# Patient Record
Sex: Female | Born: 1935
Health system: Southern US, Community
[De-identification: ages and names within clinical notes are randomized; demographics above are authoritative.]

## PROBLEM LIST (undated history)

## (undated) ENCOUNTER — Emergency Department (HOSPITAL_COMMUNITY): Admission: EM | Payer: Medicare Other | Source: Home / Self Care

## (undated) DIAGNOSIS — I10 Essential (primary) hypertension: Secondary | ICD-10-CM

## (undated) DIAGNOSIS — N814 Uterovaginal prolapse, unspecified: Secondary | ICD-10-CM

## (undated) DIAGNOSIS — B450 Pulmonary cryptococcosis: Secondary | ICD-10-CM

## (undated) DIAGNOSIS — E785 Hyperlipidemia, unspecified: Secondary | ICD-10-CM

## (undated) DIAGNOSIS — K219 Gastro-esophageal reflux disease without esophagitis: Secondary | ICD-10-CM

## (undated) DIAGNOSIS — R55 Syncope and collapse: Secondary | ICD-10-CM

## (undated) DIAGNOSIS — K5792 Diverticulitis of intestine, part unspecified, without perforation or abscess without bleeding: Secondary | ICD-10-CM

## (undated) DIAGNOSIS — R51 Headache: Secondary | ICD-10-CM

## (undated) DIAGNOSIS — F0390 Unspecified dementia without behavioral disturbance: Secondary | ICD-10-CM

## (undated) DIAGNOSIS — K632 Fistula of intestine: Secondary | ICD-10-CM

## (undated) DIAGNOSIS — C6932 Malignant neoplasm of left choroid: Secondary | ICD-10-CM

## (undated) DIAGNOSIS — M1990 Unspecified osteoarthritis, unspecified site: Secondary | ICD-10-CM

## (undated) DIAGNOSIS — M069 Rheumatoid arthritis, unspecified: Secondary | ICD-10-CM

## (undated) DIAGNOSIS — J45909 Unspecified asthma, uncomplicated: Secondary | ICD-10-CM

## (undated) HISTORY — PX: TONSILLECTOMY: SUR1361

## (undated) HISTORY — DX: Uterovaginal prolapse, unspecified: N81.4

## (undated) HISTORY — PX: NEPHRECTOMY: SHX65

## (undated) HISTORY — DX: Pulmonary cryptococcosis: B45.0

## (undated) HISTORY — DX: Gastro-esophageal reflux disease without esophagitis: K21.9

## (undated) HISTORY — PX: BRAIN MENINGIOMA EXCISION: SHX576

## (undated) HISTORY — PX: BLADDER SUSPENSION: SHX72

## (undated) HISTORY — DX: Malignant neoplasm of left choroid: C69.32

## (undated) HISTORY — PX: LUNG REMOVAL, PARTIAL: SHX233

## (undated) HISTORY — PX: EYE SURGERY: SHX253

## (undated) HISTORY — PX: CHOLECYSTECTOMY: SHX55

## (undated) HISTORY — DX: Rheumatoid arthritis, unspecified: M06.9

## (undated) HISTORY — DX: Hyperlipidemia, unspecified: E78.5

## (undated) HISTORY — PX: LOOP RECORDER IMPLANT: SHX5954

## (undated) HISTORY — DX: Syncope and collapse: R55

---

## 1997-10-06 ENCOUNTER — Ambulatory Visit (HOSPITAL_COMMUNITY): Admission: RE | Admit: 1997-10-06 | Discharge: 1997-10-06 | Payer: Self-pay | Admitting: Family Medicine

## 1998-03-18 ENCOUNTER — Other Ambulatory Visit: Admission: RE | Admit: 1998-03-18 | Discharge: 1998-03-18 | Payer: Self-pay | Admitting: *Deleted

## 1998-05-11 ENCOUNTER — Other Ambulatory Visit: Admission: RE | Admit: 1998-05-11 | Discharge: 1998-05-11 | Payer: Self-pay | Admitting: *Deleted

## 1998-10-20 ENCOUNTER — Other Ambulatory Visit: Admission: RE | Admit: 1998-10-20 | Discharge: 1998-10-20 | Payer: Self-pay | Admitting: Family Medicine

## 1998-10-26 ENCOUNTER — Encounter: Payer: Self-pay | Admitting: Family Medicine

## 1998-10-26 ENCOUNTER — Ambulatory Visit (HOSPITAL_COMMUNITY): Admission: RE | Admit: 1998-10-26 | Discharge: 1998-10-26 | Payer: Self-pay | Admitting: Family Medicine

## 1999-02-20 ENCOUNTER — Emergency Department (HOSPITAL_COMMUNITY): Admission: EM | Admit: 1999-02-20 | Discharge: 1999-02-20 | Payer: Self-pay | Admitting: Emergency Medicine

## 1999-02-20 ENCOUNTER — Encounter: Payer: Self-pay | Admitting: Emergency Medicine

## 1999-02-21 ENCOUNTER — Encounter: Payer: Self-pay | Admitting: Surgery

## 1999-02-21 ENCOUNTER — Encounter: Admission: RE | Admit: 1999-02-21 | Discharge: 1999-02-21 | Payer: Self-pay | Admitting: Surgery

## 1999-03-03 ENCOUNTER — Encounter: Payer: Self-pay | Admitting: Urology

## 1999-03-08 ENCOUNTER — Inpatient Hospital Stay (HOSPITAL_COMMUNITY): Admission: RE | Admit: 1999-03-08 | Discharge: 1999-03-13 | Payer: Self-pay | Admitting: Urology

## 1999-03-08 ENCOUNTER — Encounter (INDEPENDENT_AMBULATORY_CARE_PROVIDER_SITE_OTHER): Payer: Self-pay

## 1999-03-08 ENCOUNTER — Encounter: Payer: Self-pay | Admitting: Urology

## 1999-06-07 ENCOUNTER — Other Ambulatory Visit: Admission: RE | Admit: 1999-06-07 | Discharge: 1999-06-07 | Payer: Self-pay | Admitting: *Deleted

## 1999-10-23 ENCOUNTER — Encounter: Payer: Self-pay | Admitting: Urology

## 1999-10-23 ENCOUNTER — Encounter: Admission: RE | Admit: 1999-10-23 | Discharge: 1999-10-23 | Payer: Self-pay | Admitting: Urology

## 1999-10-25 ENCOUNTER — Other Ambulatory Visit: Admission: RE | Admit: 1999-10-25 | Discharge: 1999-10-25 | Payer: Self-pay | Admitting: Family Medicine

## 1999-10-31 ENCOUNTER — Encounter: Admission: RE | Admit: 1999-10-31 | Discharge: 1999-10-31 | Payer: Self-pay | Admitting: Family Medicine

## 1999-10-31 ENCOUNTER — Encounter: Payer: Self-pay | Admitting: Family Medicine

## 1999-10-31 ENCOUNTER — Ambulatory Visit (HOSPITAL_COMMUNITY): Admission: RE | Admit: 1999-10-31 | Discharge: 1999-10-31 | Payer: Self-pay | Admitting: Family Medicine

## 2000-01-11 ENCOUNTER — Ambulatory Visit (HOSPITAL_COMMUNITY): Admission: RE | Admit: 2000-01-11 | Discharge: 2000-01-11 | Payer: Self-pay | Admitting: Gastroenterology

## 2000-05-10 ENCOUNTER — Ambulatory Visit (HOSPITAL_COMMUNITY): Admission: RE | Admit: 2000-05-10 | Discharge: 2000-05-10 | Payer: Self-pay | Admitting: Urology

## 2000-05-10 ENCOUNTER — Encounter: Payer: Self-pay | Admitting: Urology

## 2000-08-05 ENCOUNTER — Other Ambulatory Visit: Admission: RE | Admit: 2000-08-05 | Discharge: 2000-08-05 | Payer: Self-pay | Admitting: *Deleted

## 2000-11-04 ENCOUNTER — Ambulatory Visit (HOSPITAL_COMMUNITY): Admission: RE | Admit: 2000-11-04 | Discharge: 2000-11-04 | Payer: Self-pay | Admitting: Family Medicine

## 2000-11-04 ENCOUNTER — Encounter: Payer: Self-pay | Admitting: Family Medicine

## 2001-09-15 ENCOUNTER — Other Ambulatory Visit: Admission: RE | Admit: 2001-09-15 | Discharge: 2001-09-15 | Payer: Self-pay | Admitting: *Deleted

## 2001-09-30 ENCOUNTER — Encounter: Payer: Self-pay | Admitting: Surgery

## 2001-09-30 ENCOUNTER — Encounter: Admission: RE | Admit: 2001-09-30 | Discharge: 2001-09-30 | Payer: Self-pay | Admitting: Surgery

## 2001-11-06 ENCOUNTER — Encounter: Payer: Self-pay | Admitting: Family Medicine

## 2001-11-06 ENCOUNTER — Ambulatory Visit (HOSPITAL_COMMUNITY): Admission: RE | Admit: 2001-11-06 | Discharge: 2001-11-06 | Payer: Self-pay | Admitting: Family Medicine

## 2002-11-09 ENCOUNTER — Encounter: Payer: Self-pay | Admitting: Family Medicine

## 2002-11-09 ENCOUNTER — Ambulatory Visit (HOSPITAL_COMMUNITY): Admission: RE | Admit: 2002-11-09 | Discharge: 2002-11-09 | Payer: Self-pay | Admitting: Family Medicine

## 2002-11-20 ENCOUNTER — Encounter: Payer: Self-pay | Admitting: Urology

## 2002-11-20 ENCOUNTER — Encounter: Admission: RE | Admit: 2002-11-20 | Discharge: 2002-11-20 | Payer: Self-pay | Admitting: Urology

## 2003-01-04 ENCOUNTER — Inpatient Hospital Stay (HOSPITAL_COMMUNITY): Admission: RE | Admit: 2003-01-04 | Discharge: 2003-01-09 | Payer: Self-pay | Admitting: Thoracic Surgery

## 2003-01-04 ENCOUNTER — Encounter (INDEPENDENT_AMBULATORY_CARE_PROVIDER_SITE_OTHER): Payer: Self-pay | Admitting: Specialist

## 2003-01-04 ENCOUNTER — Encounter: Payer: Self-pay | Admitting: Thoracic Surgery

## 2003-01-05 ENCOUNTER — Encounter: Payer: Self-pay | Admitting: Thoracic Surgery

## 2003-01-06 ENCOUNTER — Encounter: Payer: Self-pay | Admitting: Thoracic Surgery

## 2003-01-07 ENCOUNTER — Encounter: Payer: Self-pay | Admitting: Thoracic Surgery

## 2003-01-08 ENCOUNTER — Encounter: Payer: Self-pay | Admitting: Thoracic Surgery

## 2003-01-14 ENCOUNTER — Encounter: Admission: RE | Admit: 2003-01-14 | Discharge: 2003-01-14 | Payer: Self-pay | Admitting: Thoracic Surgery

## 2003-02-04 ENCOUNTER — Encounter: Admission: RE | Admit: 2003-02-04 | Discharge: 2003-02-04 | Payer: Self-pay | Admitting: Thoracic Surgery

## 2003-03-03 ENCOUNTER — Encounter: Admission: RE | Admit: 2003-03-03 | Discharge: 2003-03-03 | Payer: Self-pay | Admitting: Thoracic Surgery

## 2003-11-03 ENCOUNTER — Emergency Department (HOSPITAL_COMMUNITY): Admission: EM | Admit: 2003-11-03 | Discharge: 2003-11-04 | Payer: Self-pay | Admitting: Emergency Medicine

## 2003-12-09 ENCOUNTER — Ambulatory Visit (HOSPITAL_COMMUNITY): Admission: RE | Admit: 2003-12-09 | Discharge: 2003-12-09 | Payer: Self-pay | Admitting: Family Medicine

## 2003-12-09 ENCOUNTER — Other Ambulatory Visit: Admission: RE | Admit: 2003-12-09 | Discharge: 2003-12-09 | Payer: Self-pay | Admitting: *Deleted

## 2004-04-25 ENCOUNTER — Encounter: Admission: RE | Admit: 2004-04-25 | Discharge: 2004-04-25 | Payer: Self-pay | Admitting: Family Medicine

## 2004-07-21 ENCOUNTER — Inpatient Hospital Stay (HOSPITAL_COMMUNITY): Admission: EM | Admit: 2004-07-21 | Discharge: 2004-07-24 | Payer: Self-pay | Admitting: Podiatry

## 2005-01-04 ENCOUNTER — Ambulatory Visit (HOSPITAL_COMMUNITY): Admission: RE | Admit: 2005-01-04 | Discharge: 2005-01-04 | Payer: Self-pay | Admitting: Family Medicine

## 2005-04-03 ENCOUNTER — Other Ambulatory Visit: Admission: RE | Admit: 2005-04-03 | Discharge: 2005-04-03 | Payer: Self-pay | Admitting: Obstetrics & Gynecology

## 2005-04-17 ENCOUNTER — Encounter: Admission: RE | Admit: 2005-04-17 | Discharge: 2005-04-17 | Payer: Self-pay | Admitting: Obstetrics & Gynecology

## 2005-10-03 ENCOUNTER — Encounter: Admission: RE | Admit: 2005-10-03 | Discharge: 2005-10-03 | Payer: Self-pay | Admitting: Family Medicine

## 2006-01-07 ENCOUNTER — Ambulatory Visit (HOSPITAL_COMMUNITY): Admission: RE | Admit: 2006-01-07 | Discharge: 2006-01-07 | Payer: Self-pay | Admitting: Obstetrics & Gynecology

## 2006-02-21 ENCOUNTER — Ambulatory Visit: Admission: RE | Admit: 2006-02-21 | Discharge: 2006-05-12 | Payer: Self-pay | Admitting: *Deleted

## 2006-02-26 LAB — CREATININE, SERUM: Creatinine, Ser: 0.87 mg/dL (ref 0.40–1.20)

## 2006-07-01 ENCOUNTER — Encounter: Admission: RE | Admit: 2006-07-01 | Discharge: 2006-07-01 | Payer: Self-pay | Admitting: Family Medicine

## 2006-08-13 ENCOUNTER — Ambulatory Visit (HOSPITAL_COMMUNITY): Admission: RE | Admit: 2006-08-13 | Discharge: 2006-08-13 | Payer: Self-pay | Admitting: *Deleted

## 2006-08-15 ENCOUNTER — Ambulatory Visit: Admission: RE | Admit: 2006-08-15 | Discharge: 2006-10-21 | Payer: Self-pay | Admitting: Radiation Oncology

## 2006-08-15 LAB — CBC WITH DIFFERENTIAL/PLATELET
BASO%: 0.3 % (ref 0.0–2.0)
Basophils Absolute: 0 10*3/uL (ref 0.0–0.1)
EOS%: 0.6 % (ref 0.0–7.0)
HCT: 34.8 % (ref 34.8–46.6)
HGB: 12.2 g/dL (ref 11.6–15.9)
MCH: 32.5 pg (ref 26.0–34.0)
MCHC: 35 g/dL (ref 32.0–36.0)
MCV: 93 fL (ref 81.0–101.0)
MONO%: 8.8 % (ref 0.0–13.0)
NEUT%: 69.3 % (ref 39.6–76.8)
RDW: 13.7 % (ref 11.3–14.5)

## 2006-08-15 LAB — BASIC METABOLIC PANEL
CO2: 25 mEq/L (ref 19–32)
Calcium: 9.4 mg/dL (ref 8.4–10.5)
Chloride: 102 mEq/L (ref 96–112)
Creatinine, Ser: 0.69 mg/dL (ref 0.40–1.20)
Glucose, Bld: 120 mg/dL — ABNORMAL HIGH (ref 70–99)

## 2006-10-22 ENCOUNTER — Ambulatory Visit (HOSPITAL_COMMUNITY): Admission: RE | Admit: 2006-10-22 | Discharge: 2006-10-22 | Payer: Self-pay | Admitting: *Deleted

## 2007-01-09 ENCOUNTER — Ambulatory Visit (HOSPITAL_COMMUNITY): Admission: RE | Admit: 2007-01-09 | Discharge: 2007-01-09 | Payer: Self-pay | Admitting: Obstetrics & Gynecology

## 2007-11-14 ENCOUNTER — Encounter: Admission: RE | Admit: 2007-11-14 | Discharge: 2007-11-14 | Payer: Self-pay | Admitting: Obstetrics & Gynecology

## 2007-12-31 ENCOUNTER — Other Ambulatory Visit: Admission: RE | Admit: 2007-12-31 | Discharge: 2007-12-31 | Payer: Self-pay | Admitting: Obstetrics & Gynecology

## 2008-01-13 ENCOUNTER — Encounter: Admission: RE | Admit: 2008-01-13 | Discharge: 2008-01-13 | Payer: Self-pay | Admitting: Obstetrics & Gynecology

## 2008-04-16 ENCOUNTER — Inpatient Hospital Stay (HOSPITAL_COMMUNITY): Admission: EM | Admit: 2008-04-16 | Discharge: 2008-04-19 | Payer: Self-pay | Admitting: Emergency Medicine

## 2009-01-13 ENCOUNTER — Encounter: Admission: RE | Admit: 2009-01-13 | Discharge: 2009-01-13 | Payer: Self-pay | Admitting: Family Medicine

## 2009-12-22 ENCOUNTER — Encounter: Admission: RE | Admit: 2009-12-22 | Discharge: 2009-12-22 | Payer: Self-pay | Admitting: Family Medicine

## 2009-12-29 ENCOUNTER — Ambulatory Visit: Payer: Self-pay | Admitting: Cardiology

## 2009-12-29 ENCOUNTER — Inpatient Hospital Stay (HOSPITAL_COMMUNITY): Admission: EM | Admit: 2009-12-29 | Discharge: 2009-12-31 | Payer: Self-pay | Admitting: Emergency Medicine

## 2009-12-31 ENCOUNTER — Encounter (INDEPENDENT_AMBULATORY_CARE_PROVIDER_SITE_OTHER): Payer: Self-pay | Admitting: Internal Medicine

## 2010-01-09 ENCOUNTER — Ambulatory Visit (HOSPITAL_COMMUNITY): Admission: RE | Admit: 2010-01-09 | Discharge: 2010-01-09 | Payer: Self-pay | Admitting: Cardiology

## 2010-04-09 ENCOUNTER — Encounter: Payer: Self-pay | Admitting: Family Medicine

## 2010-04-20 ENCOUNTER — Encounter: Payer: Self-pay | Admitting: Internal Medicine

## 2010-05-03 ENCOUNTER — Ambulatory Visit (INDEPENDENT_AMBULATORY_CARE_PROVIDER_SITE_OTHER): Payer: Medicare Other | Admitting: Internal Medicine

## 2010-05-03 ENCOUNTER — Encounter: Payer: Self-pay | Admitting: Internal Medicine

## 2010-05-03 DIAGNOSIS — R55 Syncope and collapse: Secondary | ICD-10-CM

## 2010-05-03 DIAGNOSIS — I10 Essential (primary) hypertension: Secondary | ICD-10-CM

## 2010-05-10 NOTE — Assessment & Plan Note (Signed)
Summary: nep/syncope/poss pacemaker/[facility]/dr skains [id number]...   Visit Type:  Initial Consult Primary Provider:  Dr Nathanial Rancher   History of Present Illness: Ashley [name] is referred today by Dr. Anne Fu for evaluation of syncope in the setting of prior ILR insertion. The patient is a very pleasant elderly woman with a h/o unexplained syncope. She underwent ILR insertion several months ago. Since then she has not had any syncope. She denies c/p, sob or peripheral edema.  Preventive Screening-Counseling & Management  Alcohol-Tobacco     Smoking Status: never  Caffeine-Diet-Exercise     Does Patient Exercise: no      Drug Use:  no.    Current Medications (verified): 1)  Calcium Carbonate-Vitamin D 600-400 Mg-Unit  Tabs (Calcium Carbonate-Vitamin D) .... Two Times A Day 2)  Warfarin Sodium 5 Mg Tabs (Warfarin Sodium) .... Use As Directed By Anticoagulation Clinic 3)  Hydrochlorothiazide 12.5 Mg Tabs (Hydrochlorothiazide) .... Take One Tablet By Mouth Daily. 4)  Vitamin D (Ergocalciferol) 50000 Unit Caps (Ergocalciferol) .... Weekly 5)  Sulfasalazine 500 Mg Tabs (Sulfasalazine) .... 2 Tabs Two Times A Day 6)  Tricor 145 Mg Tabs (Fenofibrate) .... Once Daily 7)  Methylprednisolone 4 Mg Tabs (Methylprednisolone) .... Once Daily 8)  Lovaza 1 Gm Caps (Omega-3-Acid Ethyl Esters) .Marland Kitchen.. 1 Caps Once Daily 9)  Trazodone Hcl 300 Mg Tabs (Trazodone Hcl) .... At Bedtime 10)  Restasis 0.05 % Emul (Cyclosporine) .... Two Times A Day 11)  Multivitamins   Tabs (Multiple Vitamin) .... Once Daily 12)  Latanoprost 0.005 % Soln (Latanoprost) .... Once Daily 13)  Bromday 0.09 % (Daily) Soln (Bromfenac Sodium) .... One Drop in Each Eye Two Times A Day  Allergies (verified): No Known Drug Allergies  Past History:  Family History: Last updated: 05/03/2010 maternal aunt -- heart problems  Social History: Last updated: 05/03/2010 Retired  Married  Tobacco Use - No.  Alcohol Use - no Regular  Exercise - no Drug Use - no  Past Medical History: syncope HTN s/p nephrectomy s/p eye surgery  Past Surgical History: Nephrectomy-Native bladder tact lung -- partial removal tumor from left eye  Family History: maternal aunt -- heart problems  Social History: Retired  Married  Tobacco Use - No.  Alcohol Use - no Regular Exercise - no Drug Use - no Smoking Status:  never Does Patient Exercise:  no Drug Use:  no  Review of Systems  The patient denies chest pain, syncope, dyspnea on exertion, and peripheral edema.         Constipation, fatigue, constipations and arthritis are all present but otherwise all systems negative except as noted in the HPI.  Vital Signs:  Patient profile:   77 year old female Height:      63 inches Weight:      134 pounds BMI:     23.82 Pulse rate:   65 / minute BP sitting:   112 / 66  (left arm) Cuff size:   regular  Vitals Entered By: Hardin Negus, RMA (May 03, 2010 3:32 PM)  Physical Exam  General:  Elderly, well developed, well nourished, in no acute distress.  HEENT: normal Neck: supple. No JVD. Carotids 2+ bilaterally no bruits Cor: RRR no rubs, gallops or murmur Lungs: CTA with well healed ILR incision. Ab: soft, nontender. nondistended. No HSM. Good bowel sounds Ext: warm. no cyanosis, clubbing or edema Neuro: alert and oriented. Grossly nonfocal. affect pleasant     ILR Following MD skains DOI:  01/09/2010 Vendor:  [employer]  Model Number:  [id number]     Serial Number [id number]       Tachy Episodes:  115     Brady Episodes:  2  MD Comments:  Her ILR is working normally.  Impression & Recommendations:  Problem # 1:  SYNCOPE (ICD-780.2) The etiology of her symptoms is still unclear. Her ILR has demonstrated no brady or tachy arrhythmias. Will follow. Her updated medication list for this problem includes:    Warfarin Sodium 5 Mg Tabs (Warfarin sodium) ..... Use as directed by anticoagulation clinic     Methylprednisolone 4 Mg Tabs (Methylprednisolone) ..... Once daily

## 2010-05-25 NOTE — Letter (Signed)
[facility]  [facility] 2011-2012   Imported By: Marylou Mccoy 05/17/2010 14:02:23  _____________________________________________________________________  External Attachment:    Type:   Image     Comment:   External Document

## 2010-05-31 LAB — PROTIME-INR
INR: 2.4 — ABNORMAL HIGH (ref 0.00–1.49)
INR: 2.62 — ABNORMAL HIGH (ref 0.00–1.49)
Prothrombin Time: 28.1 seconds — ABNORMAL HIGH (ref 11.6–15.2)

## 2010-05-31 LAB — COMPREHENSIVE METABOLIC PANEL
ALT: 12 U/L (ref 0–35)
Albumin: 2.7 g/dL — ABNORMAL LOW (ref 3.5–5.2)
BUN: 6 mg/dL (ref 6–23)
CO2: 27 mEq/L (ref 19–32)
Calcium: 9.2 mg/dL (ref 8.4–10.5)
Creatinine, Ser: 0.69 mg/dL (ref 0.4–1.2)
GFR calc non Af Amer: >60 mL/min (ref >60)
Total Protein: 5.5 g/dL — ABNORMAL LOW (ref 6.0–8.3)

## 2010-05-31 LAB — CBC: WBC: 7.6 10*3/uL (ref 4.0–10.5)

## 2010-05-31 LAB — DIFFERENTIAL
Basophils Relative: 0 % (ref 0–1)
Eosinophils Absolute: 0.1 10*3/uL (ref 0.0–0.7)
Lymphs Abs: 2.2 10*3/uL (ref 0.7–4.0)
Monocytes Absolute: 0.8 10*3/uL (ref 0.1–1.0)
Monocytes Relative: 10 % (ref 3–12)

## 2010-05-31 LAB — SURGICAL PCR SCREEN: Staphylococcus aureus: NEGATIVE

## 2010-05-31 LAB — METHYLMALONIC ACID(MMA), RND URINE

## 2010-06-01 LAB — CK TOTAL AND CKMB (NOT AT ARMC)
CK, MB: 0.5 ng/mL (ref 0.3–4.0)
CK, MB: 0.6 ng/mL (ref 0.3–4.0)
CK, MB: 0.7 ng/mL (ref 0.3–4.0)
Relative Index: INVALID (ref 0.0–2.5)
Total CK: 47 U/L (ref 7–177)
Total CK: 50 U/L (ref 7–177)
Total CK: 56 U/L (ref 7–177)

## 2010-06-01 LAB — URINALYSIS, ROUTINE W REFLEX MICROSCOPIC
Ketones, ur: NEGATIVE mg/dL
Nitrite: NEGATIVE
pH: 7 (ref 5.0–8.0)

## 2010-06-01 LAB — CARDIAC PANEL(CRET KIN+CKTOT+MB+TROPI)
CK, MB: 0.9 ng/mL (ref 0.3–4.0)
Relative Index: INVALID (ref 0.0–2.5)
Total CK: 59 U/L (ref 7–177)

## 2010-06-01 LAB — BASIC METABOLIC PANEL
BUN: 5 mg/dL — ABNORMAL LOW (ref 6–23)
BUN: 7 mg/dL (ref 6–23)
Calcium: 9.5 mg/dL (ref 8.4–10.5)
Creatinine, Ser: 0.73 mg/dL (ref 0.4–1.2)
Creatinine, Ser: 0.82 mg/dL (ref 0.4–1.2)
GFR calc Af Amer: >60 mL/min (ref >60)
GFR calc non Af Amer: >60 mL/min (ref >60)
Glucose, Bld: 107 mg/dL — ABNORMAL HIGH (ref 70–99)
Potassium: 3.5 mEq/L (ref 3.5–5.1)

## 2010-06-01 LAB — CBC
Hemoglobin: 12.9 g/dL (ref 12.0–15.0)
MCHC: 33.4 g/dL (ref 30.0–36.0)
MCHC: 33.6 g/dL (ref 30.0–36.0)
Platelets: 155 10*3/uL (ref 150–400)
Platelets: 161 10*3/uL (ref 150–400)
RDW: 13.7 % (ref 11.5–15.5)
RDW: 14 % (ref 11.5–15.5)
WBC: 7.1 10*3/uL (ref 4.0–10.5)

## 2010-06-01 LAB — PROTIME-INR
INR: 1.6 — ABNORMAL HIGH (ref 0.00–1.49)
Prothrombin Time: 19.2 seconds — ABNORMAL HIGH (ref 11.6–15.2)

## 2010-06-01 LAB — TROPONIN I
Troponin I: 0.04 ng/mL (ref 0.00–0.06)
Troponin I: 0.04 ng/mL (ref 0.00–0.06)

## 2010-06-01 LAB — VITAMIN B12: Vitamin B-12: 270 pg/mL (ref 211–911)

## 2010-06-01 LAB — TSH: TSH: 0.646 u[IU]/mL (ref 0.350–4.500)

## 2010-06-01 LAB — POCT CARDIAC MARKERS: CKMB, poc: <1 ng/mL — ABNORMAL LOW (ref 1.0–8.0)

## 2010-07-03 LAB — CK TOTAL AND CKMB (NOT AT ARMC)
Relative Index: INVALID (ref 0.0–2.5)
Total CK: 31 U/L (ref 7–177)

## 2010-07-03 LAB — POCT I-STAT, CHEM 8
BUN: 16 mg/dL (ref 6–23)
Calcium, Ion: 1.24 mmol/L (ref 1.12–1.32)
HCT: 42 % (ref 36.0–46.0)
Hemoglobin: 14.3 g/dL (ref 12.0–15.0)
Sodium: 143 mEq/L (ref 135–145)
TCO2: 25 mmol/L (ref 0–100)

## 2010-07-03 LAB — TSH: TSH: 0.686 u[IU]/mL (ref 0.350–4.500)

## 2010-07-03 LAB — POCT CARDIAC MARKERS
CKMB, poc: <1 ng/mL — ABNORMAL LOW (ref 1.0–8.0)
Myoglobin, poc: 21.9 ng/mL (ref 12–200)
Myoglobin, poc: 22.5 ng/mL (ref 12–200)
Troponin i, poc: <0.05 ng/mL (ref 0.00–0.09)

## 2010-07-03 LAB — CBC
HCT: 41 % (ref 36.0–46.0)
MCV: 95.6 fL (ref 78.0–100.0)
Platelets: ADEQUATE 10*3/uL (ref 150–400)
RDW: 14.4 % (ref 11.5–15.5)
WBC: 10.3 10*3/uL (ref 4.0–10.5)

## 2010-07-03 LAB — DIFFERENTIAL
Basophils Absolute: 0 10*3/uL (ref 0.0–0.1)
Eosinophils Relative: 1 % (ref 0–5)
Lymphs Abs: 2.3 10*3/uL (ref 0.7–4.0)
Monocytes Absolute: 0.5 10*3/uL (ref 0.1–1.0)
Neutrophils Relative %: 72 % (ref 43–77)

## 2010-07-03 LAB — CARDIAC PANEL(CRET KIN+CKTOT+MB+TROPI)
CK, MB: 0.7 ng/mL (ref 0.3–4.0)
Total CK: 44 U/L (ref 7–177)

## 2010-07-04 LAB — CBC
HCT: 42.7 % (ref 36.0–46.0)
Hemoglobin: 14.2 g/dL (ref 12.0–15.0)
MCV: 94.6 fL (ref 78.0–100.0)
Platelets: 225 10*3/uL (ref 150–400)
RDW: 15.2 % (ref 11.5–15.5)

## 2010-07-04 LAB — BASIC METABOLIC PANEL
BUN: 14 mg/dL (ref 6–23)
CO2: 27 mEq/L (ref 19–32)
Chloride: 105 mEq/L (ref 96–112)
GFR calc non Af Amer: >60 mL/min (ref >60)
Glucose, Bld: 86 mg/dL (ref 70–99)
Potassium: 4 mEq/L (ref 3.5–5.1)
Sodium: 138 mEq/L (ref 135–145)

## 2010-08-01 NOTE — H&P (Signed)
[name], [name]                 ACCOUNT NO.:  [id number]   MEDICAL RECORD NO.:  [id number]          PATIENT TYPE:  INP   LOCATION:  3741                         FACILITY:  [facility]   PHYSICIAN:  Francisca December, M.D.  DATE OF BIRTH:  January 10, 1929   DATE OF ADMISSION:  04/16/2008  DATE OF DISCHARGE:                              HISTORY & PHYSICAL   CHIEF COMPLAINT:  Syncope.   HISTORY OF PRESENT ILLNESS:  Ms. Ashley [name] is a 77 year old female who is  sitting in her chair today and family saw her eyes roll back into her  head.  She was unarousable for about 1 minute.  When she awoke, there  was no confusion.  There was no urinary incontinence.  The family does  not describe any type of seizure activity.  The patient denies any  palpitations or chest pain.   The patient was brought to [facility] EMS and her heart was in  the 40s.  At this point, she was asymptomatic.  Records indicate that  she had postural syncope secondary to dehydration in 2006.   PAST MEDICAL HISTORY:  1. Syncope as above.  2. Hypertension.  3. Dyslipidemia.  4. Rheumatoid arthritis, followed by Dr. Jimmy Footman and on chronic      steroids.  5. History of right nephrectomy in 2000 for renal cell carcinoma.  6. Status post lung resection, benign cryptococcal infection.  7. Left eye melanoma, status post radiation therapy and eventually      surgery to remove growth.  8. History of glaucoma.  9. Status post cholecystectomy.  10.Uterine prolapse, status post repair.   SOCIAL HISTORY:  She lives in [place] with her husband.  No tobacco,  alcohol, or illicit use.   FAMILY HISTORY:  Mother is alive and well at age 69.  Father's history  is unknown.   ALLERGIES:  No known drug allergies.   MEDICATIONS:  1. Baby aspirin 81 mg daily.  2. Calcium daily.  3. Vitamin D daily.  4. Centrum daily.  5. Bowel stimulant daily.  6. Hydrochlorothiazide 25 mg 1-1/2 tablet daily.  7. Lavaza 1 tablet p.o. daily.  8.  Tricor 145 mg a day.  9. Methylprednisolone 4 mg a day.  10.Prilosec 20 mg a day.  11.Trazodone 300 mg at bedtime.   REVIEW OF SYSTEMS:  Multiple arthralgias secondary to arthritis, history  of syncope, review of systems otherwise negative.   PHYSICAL EXAMINATION:  VITAL SIGNS:  Temp 97, pulse 54, respirations 16,  blood pressure 128/74, oxygen saturations 96% on room air.  GENERAL:  She is in no acute distress.  HEENT:  Grossly normal.  Sclerae clear.  Conjunctivae normal.  Nares  without drainage.  NECK:  No carotid or subclavian bruits.  No JVD or thyromegaly.  CHEST:  Clear to auscultation bilaterally.  No wheezing, no rhonchi.  HEART:  Regular rate and rhythm.  No rubs or murmur.  ABDOMEN:  Good bowel sounds, nontender, and nondistended.  No masses, no  bruits.  EXTREMITIES:  No peripheral extremity edema.  SKIN:  Warm and dry.  NEUROLOGIC:  Cranial nerves II through XII grossly intact.  PSYCHIATRIC:  Normal mood and affect.   CT of the head is nonacute.   Chest x-ray shows a questionable nodular density at the right base,  medially, retrocardiac.  CT recommended.   LABS:  Hemoglobin 13.3, hematocrit 41.0, white count 10.3, platelets are  clumped.  Sodium 143, potassium 3.5, BUN 16, creatinine 0.9, glucose  137, PTT 25, PT 13.2, INR 1.0.  Point of care markers negative x2.  EKG  has sinus bradycardia, rate 49.  No acute ST-T wave changes.   ASSESSMENT:  1. Syncope thought likely secondary to sinus node dysfunction.  2. Sinus bradycardia.  3. Hypertension.  4. Dyslipidemia.  5. Rheumatoid arthritis, on chronic steroid therapy.  6. Right lung nodule, CT ordered.   PLAN:  We will cycle enzymes, check TSH.  We will continue to monitor  bradycardia and consider it as a significant reason for her syncope.  May ultimately need pacemaker at the beginning of the week.  We will  continue to monitor.   The patient was seen and examined by Dr. Corliss Marcus.      Guy Franco, P.A.      Francisca December, M.D.  Electronically Signed    LB/MEDQ  D:  04/16/2008  T:  04/17/2008  Job:  [id number]   cc:   Lianne Bushy, M.D.

## 2010-08-04 NOTE — Discharge Summary (Signed)
[name], [name]                           ACCOUNT NO.:  [id number]   MEDICAL RECORD NO.:  [id number]                   PATIENT TYPE:  INP   LOCATION:  2022                                 FACILITY:  [facility]   PHYSICIAN:  D. Karle Plumber, M.D.             DATE OF BIRTH:  01-Mar-1929   DATE OF ADMISSION:  01/04/2003  DATE OF DISCHARGE:  01/09/2003                                 DISCHARGE SUMMARY   ADMITTING DIAGNOSES:  Right lower lobe lung lesion.   PAST MEDICAL HISTORY:  1. Rheumatoid arthritis, symptoms primarily in the hands.  2. Hypercholesterolemia.  3. Renal cell carcinoma status post right nephrectomy.  4. GERD.  5. History of uterine prolapse, status post surgical repair.  6. History of cataracts.  7. Status post cholecystectomy.   ALLERGIES:  No known drug allergies.   DISCHARGE DIAGNOSES:  Cryptococcus of right lung, status post right video-  assisted thoracic surgery and right lower lobe wedge resection.   BRIEF HISTORY:  Ashley [name] is a 77 year old African American female.  After a  recent followup for her renal cell carcinoma, a chest x-ray was obtained and  revealed a right lower lobe lung lesion.  This was confirmed on CT scan.  Specifically, two nodules were seen in the right lower lobe with the largest  measuring slightly over 1 cm.  Metastatic disease was suspected.  She was  referred to CVTS and was evaluated in the CVTS office by Dr. Karle Plumber.  After examination of the patient and review of the operative records, he  recommended proceeding with surgical resection for diagnosis and treatment.  The procedure risks and benefits were all discussed with Ashley [name] and she  agreed to proceed.   HOSPITAL COURSE:  On January 04, 2003, Ashley [name] was electively admitted to  [facility] under the care of Dr. Karle Plumber.  She underwent the  following surgical procedure:  Right video-assisted thoracoscopy, right mini  thoracotomy, wedge resection  superior segment right lower lobe, lymph node  sampling.  She tolerated this procedure well transferring in stable  condition to the PACU.  Intraoperative frozen section pathology examination  reveals granulomatous disease of the right lower lobe.  Ashley [name] was  extubated following her surgery and she remained hemodynamically stable in  the immediate postoperative period.   Ashley [name] postoperative course has been essentially uneventful.  Her chest  tubes were removed in a serial fashion on postoperative day #2 and #3.  Final pathology from her lung lesion was returned as Cryptococcus.  Because  of this diagnosis,  Infectious Disease consultation was requested.  [name] was seen by Dr. Cliffton Asters on January 06, 2003.  His recommendation  to treat the Cryptococcus was Diflucan 100 mg daily for a period of 4-6  weeks.   Ashley [name] is making very good progress in recovering from her surgery.  She  has had some complaints of mild dysphagia and reports that she has been  previously treated with a PPI in the past.  Protonix was started today,  January 08, 2003.  Dr. Blair Dolphin final recommendation for length of  treatment for Cryptococcus was four more weeks of Diflucan.   This morning, January 08, 2003, is postoperative day #4.  Ashley [name] reports  feeling well.  Only complaints are of some mild hoarseness and dysphagia.  Her vital signs are stable.  She is afebrile.  Her heart is maintaining  normal sinus rhythm.  Her lungs are clear.  Her surgical incisions are  healing well.  She has been ambulating in the hall with minimal assistance.  Her pain is well controlled.  If Ashley [name] continues to progress in this  manner, she will be ready for discharge home tomorrow January 09, 2003.   LABORATORY DATA:  October 22:  CBC:  White blood cells 12.4, hemoglobin 8.8,  hematocrit 25.7, platelets 269,000.  Chemistries included sodium 141,  potassium 3.7, BUN 13, creatinine 0.9, glucose 88.    CONDITION ON DISCHARGE:  Improved.   DISCHARGE MEDICATIONS:  1. Protonix 40 mg daily.  2. Diflucan 100 mg daily x4 more weeks.  3. Potassium chloride 20 mEq p.o. daily.  4. She is instructed to resume her home medications of TriCor 54 mg daily.  5. HCTZ 1/2 tablet daily.  6. Trazodone 300 mg at bedtime.  7. Aspirin 81 mg daily.  8. Eye drops daily.  9. Methyl prednisone 4 mg daily.  10.      Actonel 30 mg q. week.  11.      For pain management, she may have Tylox 1-2 p.o. q.4-6 h. p.r.n.   ACTIVITY:  She is to refrain from any driving or any heavy lifting.  Also  instructed to continue her breathing exercises and daily walking.   DIET:  Her diet should continue to be a heart healthy diet.   WOUND CARE:  She is to shower daily.  If her incision shows any signs of  infection or she has a fever of greater than 101 degrees Fahrenheit she is  to call Dr. Scheryl Darter office.    FOLLOWUP:  Dr. Edwyna Shell would like to see her at the CVTS office on Thursday,  October 28, at 2:30 in the afternoon.  She will be asked to have a chest x-  ray at [facility] one hour prior to that appointment.      Toribio Harbour, N.P.                  Norton Blizzard, M.D.    CTK/MEDQ  D:  01/08/2003  T:  01/08/2003  Job:  [id number]   cc:   Rozanna Boer., M.D.  [address]  [place]  [place] [address]  Fax: [phone]   Lianne Bushy, M.D.  [address]  [place]  [place] [address]  Fax: [phone]

## 2010-08-04 NOTE — H&P (Signed)
[name], [name]                 ACCOUNT NO.:  [id number]   MEDICAL RECORD NO.:  [id number]          PATIENT TYPE:  EMS   LOCATION:  MAJO                         FACILITY:  [facility]   PHYSICIAN:  Mobolaji B. Bakare, M.D.DATE OF BIRTH:  [birth year]   DATE OF ADMISSION:  07/21/2004  DATE OF DISCHARGE:                                HISTORY & PHYSICAL   PRIMARY CARE PHYSICIAN:  Dr. Bernita Raisin in [place].   CHIEF COMPLAINT:  Passed out this afternoon.   HISTORY OF PRESENT COMPLAINT:  Ashley [name] is a pleasant 77 year old African-  American female with past medical history of renal cell carcinoma status  post right nephrectomy in 2000, rheumatoid arthritis, and  hypercholesterolemia.  She was in her usual state of health until about 2:00  p.m. today.  While standing up from a sitting position in a restaurant, she  passed out and sustained forehead hematoma.  There was no prodromal  symptoms. No palpation, no diaphoresis, no chest pain, no shortness of  breath.  She could not recall how long she passed out but she was brought to  the emergency department by EMS.  I do not have record of EMS findings at  the site of emergency.  On arrival in emergency department, the patient was  found to be orthostatic.  She had a CAT scan of the head which was negative  for any intracranial findings, however it did show forehead hematoma.  She  had negative cardiac enzymes x3 at the point of care.  EKG is normal sinus  rhythm.   REVIEW OF SYSTEMS:  Negative for fever, chills, abdominal pain.  There is no  change in her bowel habits.  No dysuria, no urgency.  She does have  headaches.   PAST MEDICAL HISTORY:  1.  Rheumatoid arthritis with deformities in both hands and feet.  She has      had surgery to both feet for correction of deformity.  2.  Hypercholesterolemia.  Last cholesterol checked was about four months.      The patient stated that it was good.  3.  Renal cell carcinoma, status post right  nephrectomy 2000.  4.  History of GERD.  5.  History of Cryptococcal infection right lower lobe of lung, status post      right video assisted thoracic surgery and wedge resection in October      2004.  6.  Status post cholecystectomy.  7.  History of cataract.  8.  Status post uterine prolapse, status post surgical repair.  9.  Hypertension.   MEDICATIONS:  1.  Trazodone.  2.  Tricor.  3.  Methylprednisolone.  4.  Hydrochlorothiazide 75 mg/triamterene 37.5 one half tablet daily.  5.  She uses another medication for rheumatoid arthritis which she cannot      recall the dose.  I have instructed family to bring medications in the      morning.   ALLERGIES:  No known drug allergies.   SOCIAL HISTORY:  She does not smoke cigarettes.  She does not drink alcohol.  She is independent of activities  of daily living.  She is a retired Radio broadcast assistant.   FAMILY HISTORY:  She is married, has four children and eight grandchildren.  No family history of heart disease.   VITALS:  Blood pressure 155/70, with a pulse of 69, and 140/129 with a pulse  of 72 sitting, 117/81 with a pulse of 75, standing.  Saturation 97%.  Regular rate and rhythm 16 and temperature 98.3.   PHYSICAL EXAMINATION:  She is comfortable but in acute respiratory distress.  Normocephalic bulge in frontal area consistent with hematoma.  Pupils small  and nonreactive to light.  Extraocular muscles intact.  No carotid bruit.  No cervical lymphadenopathy.  No elevated JVD.  LUNGS:  Clear clinically to auscultation.  CV:  S1, S2, regular. Ejection systolic murmur in the aortic area.  No  gallop, no rub.  ABDOMEN:  Soft, nontender.  No palpable organomegaly.  Bowel sounds present.  EXTREMITIES:  No pedal edema.  No calf tenderness.  SKIN:  No rash.  No petechiae.  CNS:  Cranial nerves intact.  No focal neurological deficit.  5/5 in all  limbs.  Tone equal and symmetrical.  No sensory loss.   The patient was able to ambulate  to the bathroom without any dizziness.   LABORATORY DATA:  White cell 8.9, hemoglobin 12.3, hematocrit 36.9, MCV  92.1, platelets 320.  Neutrophils 70, lymphocytes 23.  Urinalysis:  Cloudy,  not purulent.  Specific gravity 1.015.  Negative for esterase and nitrites.  Negative for protein.  Microscopic not done.  Sodium 139, potassium 3.7,  chloride 104, bicarb 25, BUN 7, creatinine 0.9.  Calcium 11.  Albumin 3.8,  total protein 7.0, calcium 11, AST 24, ALT 14, alkaline phosphatase  , total  bilirubin 0.6.  Cardiac enzyme at the point of care x3 negative.  PTT 29, PT  12.7, INR 1.0.  CT scan of the head:  No acute intracranial abnormalities.  Soft tissue swelling consistent with hematoma.  EKG:  Normal sinus rhythm  with fourth degree AV block, at rate of 66, and PR interval of 217 msec, QTC  interval normal 419.   ASSESSMENT/PLAN:  Ashley [name] is a 77 year old African-American female  presenting with syncope.  Went to get up from a sitting position.  She has a  positive orthostasis.   Problem 1. Syncope.  Probably secondary to dehydration.  Rule out cardiac in  etiology.  Admit to telemetry, IV fluid normal saline at 130mL/hour.  Check  orthostatic blood pressure and heart rate in a.m.  Serial cardiac enzymes  every hour x2.  Check 2-D echocardiogram.   Problem 2. Mild hypercalcemia.  Probably secondary to dehydration.  We check  Bmet after hydration.   Problem 3. History of hyperlipidemia.  Continue Tricor 45 mg p.o. daily.  Check fasting lipid profile.   Problem 4. History of rheumatoid arthritis.  Methylprednisolone 4 mg p.o.  daily.  The patient to bring complete list of medication in the morning.   Problem 5. History of renal cell carcinoma status post nephrectomy.   Problem 6. Hypertension.  Will hold hydrochlorothiazide and triamterene for  now.  Use Norvasc 5 mg p.o. daily.  Problem 7. DVT prophylaxis with sequential compression device.      MBB/MEDQ  D:  07/21/2004   T:  07/21/2004  Job:  [id number]   cc:   Lianne Bushy, M.D.  [address]  [place]  [place] [address]  Fax: [phone]

## 2010-08-04 NOTE — H&P (Signed)
Ashley [name], Ashley [name]                           ACCOUNT NO.:  [id number]   MEDICAL RECORD NO.:  [id number]                   PATIENT TYPE:  INP   LOCATION:  NA                                   FACILITY:  [facility]   PHYSICIAN:  Ines Bloomer, M.D.              DATE OF BIRTH:  1929-02-08   DATE OF ADMISSION:  04/05/2002  DATE OF DISCHARGE:                                HISTORY & PHYSICAL   CHIEF COMPLAINT:  Right lower lobe lung lesion.   HISTORY OF PRESENT ILLNESS:  This is a 77 year old female with a previous  history of renal cell carcinoma, status post right nephrectomy in 2000.  She  did not have adjuvant chemotherapy or radiation.  In recent followup, a  chest x-ray was obtained and this revealed a right lower lobe lesion which  was confirmed on CT scan, specifically two nodules seen in the right lower  lobe with the largest measuring slightly over 1 cm in greatest dimension and  metastatic disease was suspected.  Due to this, a thoracic surgical  consultation was obtained with Ines Bloomer, M.D., who evaluated the  patient and studies and recommended a video assisted thoracoscopic procedure  for biopsy and treatment purposes.  The patient is relatively asymptomatic,  although she does have some shortness of breath and dyspnea on exertion with  a mild dry cough without sputum production.  She also denies hemoptysis and  also denies chest pain.  She does have occasional pain that she states is in  her breast.  She does report a mammogram done this August was negative per  the patient's report.  She is admitted this hospitalization for the  procedure.   ALLERGIES:  No known drug allergies.   MEDICATIONS:  1. Betimol eyedrops.  2. TriCor 54 mg one daily.  3. Hydrochlorothiazide 25 mg one-half tablet daily.  4. Methylprednisolone 4 mg daily.  5. Actonel 30 mg every week.  6. Methotrexate 100 mg every week.  7. Trazodone 300 mg q.h.s.   PAST MEDICAL HISTORY:  1.  Arthritis.  The primary symptoms are in her hands.  2. Hypercholesterolemia.  3. Renal cell carcinoma, status post right nephrectomy.  4. Gastrointestinal reflux.  5. History of uterine prolapse, status post surgical repair.  6. History of cataracts.  7. Status post cholecystectomy.   The patient denies diabetes, history of myocardial infarction, history of  congestive heart failure, history of irregular heart rhythm, and history of  cerebrovascular accident or TIA.   SOCIAL HISTORY:  She is retired.  She has four children.  She is married.  She follows no specific diet.  She uses no tobacco and has never.  She uses  no alcohol.   FAMILY HISTORY:  Noncontributory.   REVIEW OF SYSTEMS:  SKIN:  Unremarkable.  LYMPH NODES:  Unremarkable.  MUSCULOSKELETAL:  Remarkable for arthritis symptoms and muscle stiffness.  HEMATOLOGIC:  Unremarkable.  ENDOCRINE:  She does feel weak.  HEENT:  Occasional headaches.  Occasional sinus symptoms.  Occasional hoarseness.  Occasional voice changes.  BREASTS:  She does have intermittent breast  pains.  PULMONARY:  Shortness of breath, as well as dyspnea on exertion, a  dry cough, and occasional night sweats.  CARDIAC:  Unremarkable.  GASTROINTESTINAL:  Positive for occasional abdominal pain and occasional  constipation symptoms.  GENITOURINARY:  Remarkable for nocturia, usually two  times per night.  NEUROLOGIC:  She does have insomnia, otherwise  unremarkable.   PHYSICAL EXAMINATION:  VITAL SIGNS:  Blood pressure 130/76, pulse 76,  respirations 12.  GENERAL APPEARANCE:  This is a well-developed, adult female who appears  younger than her stated age and in no acute distress.  SKIN:  No obvious lesions.  LYMPHATICS:  No palpable adenopathy.  HEENT:  Unremarkable.  She does have dentures.  NECK:  Positive jugular venous distention.  No carotid bruits.  CHEST:  Clear to auscultation bilaterally.  CARDIAC:  Normal S1 and S2.  Regular rate and rhythm.  No  murmurs, rubs, or  gallops.  EXTREMITIES:  No cyanosis, clubbing, or edema.  ABDOMEN:  Soft.  No hepatosplenomegaly.  No masses.  Normoactive bowel  sounds.  No tenderness to palpation.  NEUROLOGIC:  Nonfocal.  Cranial nerves II-XII grossly intact.  SPINE:  No tenderness to palpation.  No costovertebral angle tenderness.  RECTAL:  Deferred.   IMPRESSION:  Rule out metastatic renal cell carcinoma.   PLAN:  Right video assisted thoracoscopy and wedge resection.  Other plans  are as deemed per Dr. Edwyna Shell dependent on operative findings.      Rowe Clack, P.A.-C.                    Ines Bloomer, M.D.    Sherryll Burger  D:  12/31/2002  T:  12/31/2002  Job:  [id number]   cc:   Rozanna Boer., M.D.  [address]  [place]  [place] [address]  Fax: [phone]   Lianne Bushy, M.D.  [address]  [place]  [place] [address]  Fax: [phone]

## 2010-08-04 NOTE — Op Note (Signed)
Ashley [name], Ashley [name]                           ACCOUNT NO.:  [id number]   MEDICAL RECORD NO.:  [id number]                   PATIENT TYPE:  INP   LOCATION:  2861                                 FACILITY:  [facility]   PHYSICIAN:  Ines Bloomer, M.D.              DATE OF BIRTH:  10/21/1928   DATE OF PROCEDURE:  DATE OF DISCHARGE:                                 OPERATIVE REPORT   PREOPERATIVE DIAGNOSES:  Right lower lobe lesion, status post nephrectomy  for renal cell cancer.   POSTOPERATIVE DIAGNOSIS:  Granulomatous disease, right lower lobe.   OPERATION PERFORMED:  Right video-assisted thoracic surgery, wedge  resection, right lower lobe lesion with node sampling.   SURGEON:  Ines Bloomer, M.D.   FIRST ASSISTANT:  Pecola Leisure, P.A.-C.   PROCEDURE:  After percutaneous insertions of all monitoring lines, the  patient underwent general anesthesia, was prepped and draped in the usual  sterile manner.  She was turned into the right lateral thoracotomy position.  The patient had a previous nephrectomy and was found to have two lesion in  the right lower lobe.  She was also on prednisone and methotrexate for  arthritis.  After a dual-lumen tube had be inserted, through the two trocar  sites were inserted two trocars.  A 30-degree scope was inserted, and  pictures were taken of the lesions in the lung.  She also had some pleural  plaquing that appeared to be benign.  The two lesions were in the superior  segment of the right lower lobe.  A small 7-cm incision was made over the  sixth intercostal space.  The latissimus was partially divided.  The  intercostal space was entered.  The serratus was reflected anteriorly.  A  Toupier was placed in the intercostal space.  The lesion was identified in  the superior segment and resected with two applications of the EZ 45  stapler.  There was another small little nodule in the right middle lobe,  and this was resected with two  applications of the EZ 45 stapler, and then  in the right lower lobe superior segment, some more small lesions were  resected with two applications of the EZ 45 stapler.  The inferior pulmonary  ligament was taken, and biopsies of 9L nodes were done as well as in the  subcarinal area seven nodes were done, and in the major fissure, an 11L node  was done, and then the lung was resutured and oversewn with 3-0 silk.  Frozen section revealed granulomatous process, and this was cultured in  pathology.  Two chest tubes were brought in through the trocar sites and  tied in place with 0 silk.  The chest was closed with two paracostals, and  the On-Q was placed underneath the paracostals in the usual fashion.  A  Marcaine block was done in the usual fashion.  Muscle was closed with #1  Vicryl, subcutaneous tissue with 2-0 Vicryl, and the skin with a 3-0 Vicryl  subcuticular stitch.  Dry sterile dressings were applied.  The patient  returned to the recovery room in stable condition.                                               Ines Bloomer, M.D.    DPB/MEDQ  D:  01/04/2003  T:  01/04/2003  Job:  [id number]   cc:   Rozanna Boer., M.D.  [address]  [place]  [place] [address]  Fax: [phone]

## 2010-08-04 NOTE — Discharge Summary (Signed)
Ashley [name], Ashley [name] NO.:  [id number]   MEDICAL RECORD NO.:  [id number]          PATIENT TYPE:  INP   LOCATION:  5511                         FACILITY:  [facility]   PHYSICIAN:  Isidor Holts, M.D.  DATE OF BIRTH:  02-21-1929   DATE OF ADMISSION:  07/21/2004  DATE OF DISCHARGE:  07/24/2004                                 DISCHARGE SUMMARY   PRIMARY MEDICAL DOCTOR:  Dr. Lianne Bushy, [place], [place]   DISCHARGE DIAGNOSES:  1.  Postural syncope secondary to dehydration/orthostasis.  Head computed      tomography scan, chest x-ray, carotid duplex all negative.  A 2-D      echocardiogram done, report pending.  2.  History of hypertension/dyslipidemia.  3.  History of rheumatoid arthritis on long-term steroid treatment.   DISCHARGE MEDICATIONS:  1.  Norvasc 5 mg p.o. daily.  2.  Stop hydrochlorothiazide and triamterene.  3.  Continue all other pre-admission medications.   PROCEDURES:  1.  Cervical spine x-ray dated Jul 21, 2004 which showed no acute      abnormality.  There was, however, severe degenerative disk disease at C5-      6 with diffuse degenerative facet joint arthritis.  2.  X-ray thoracic spine dated Jul 21, 2004 which showed no significant      abnormality of thoracic spine.  3.  Head CT dated Jul 21, 2004 that showed no acute intracranial findings,      although there is prominent frontal scalp hematoma.  4.  Chest x-ray dated Jul 21, 2004 and Jul 22, 2004 that showed no evidence      for acute cardiopulmonary disease.  5.  A 2-D echocardiogram dated Jul 24, 2004, report is still pending at the      time of this dictation.   CONSULTATIONS:  None.   ADMISSION HISTORY:  As in H&P dated Jul 21, 2004.  However, in brief, this is  a 77 year old female with a known history of remote renal cell carcinoma,  status post right nephrectomy in 2000, Rheumatoid Arthritis on chronic  steroid treatment, Dyslipidemia, who experienced an episode of postural  syncope while getting up from a sitting position in a restaurant.  She fell  and sustained a forehead hematoma.  She was brought to the Emergency  department at [facility] and admitted for further evaluation,  investigation and management.   HOSPITAL COURSE:  Problem 1. Postural syncope.  On presentation, the patient  was found to have orthostasis with a lying blood pressure of 155/70 and  pulse of 69, BP of 140/129 with pulse of 72 sitting, BP 117/81 with a pulse  of 75 standing.  EKG showed no acute ischemic changes.  Serial cardiac  enzymes remained unelevated.  It was thought that patient's postural syncope  was due to combination of dehydration and orthostasis.  Diuretic  antihypertensive medications were, therefore, held.  The patient was managed  with intravenous fluid hydration with satisfactory clinical response.  Orthostasis resolved.  The patient remained asymptomatic, was able to  ambulate.   Problem 2. Status post fall  secondary to #1 resulting in forehead scalp  hematoma.  At presentation, the patient was evaluated with a head CT scan  which was negative for intracranial abnormalities.  Physical examination  confirmed a scalp forehead hematoma and patient also developed bilateral  periorbital bruising.  Over the next couple of days, bruising gradually  faded, hematoma diminished in size.  The patient did not appear to be any  acute distress.   Problem 3. Hypertension.  As mentioned above, the patient showed postural  hypotension with orthostasis at the time of presentation.  Antihypertensive  medications were, therefore, discontinued initially, and then patient was  commenced on a relatively low-dose of Norvasc for blood pressure control.  She was able to tolerate this without a relapse of orthostasis.  She has  been subsequently recommended to discontinue hydrochlorothiazide and  triamterene until reviewed in due course by her PMD.   Carotid/Vertebral duplex  scan showed no evidence of significant stenosis.  A  2-D echocardiogram was done on Jul 24, 2004 and at the time of this  dictation, the report is still pending.  A serum a.m. cortisol level was  10.1, i.e. within normal limits effectively ruling out secondary adrenal  corticol insufficiency.  Lipid profile was excellent with total cholesterol  of 129, triglycerides 93, HDL 70, LDL 40.   DISPOSITION:  As patient remained asymptomatic, she was discharged in  satisfactory condition on Jul 24, 2004.   PAIN MANAGEMENT:  Not applicable.   ACTIVITY:  No restrictions.   DIET:  Healthy Heart.   WOUND CARE:  Not applicable.   FOLLOWUP ARRANGEMENTS:  The patient is to follow up with her PMD, Dr.  Lianne Bushy in [place], [place] within one week.  The patient has  been instructed to call for an appointment and has verbalized understanding.      CO/MEDQ  D:  07/28/2004  T:  07/29/2004  Job:  [id number]   cc:   Lianne Bushy, M.D.  [address]  [place]  [place] [address]  Fax: [phone]

## 2010-08-04 NOTE — Discharge Summary (Signed)
[facility]  Patient:    Ashley [name]                         MRN: [id number] Adm. Date:  [id number] Disc. Date: [id number] Attending:  Ellwood Handler CC:         Maricela Bo, M.D.                           Discharge Summary  REFERRING PHYSICIAN:  Maricela Bo, M.D.  UROLOGIST:  Verl Dicker, M.D.  INDICATIONS: MEDICATIONS: ALLERGIES: TOBACCO: ETOH: PAST MEDICAL HISTORY: SOCIAL HISTORY: PHYSICAL EXAMINATION: REVIEW OF SYSTEMS:  All outline in the admitting note.  HOSPITAL COURSE:  The patient was admitted on March 08, 1999, and underwent cholecystectomy by [place]. Daphine Deutscher, M.D., right nephrectomy by Verl Dicker, M.D.  The patient had a pleasantly uneventful postoperative recovery. On postoperative day #1, BUN 14, creatinine 1.2, hemoglobin 10.8.  Foley catheter discontinued on postoperative day #2.  Jackson-Pratt drain discontinued on postoperative day #3.  The patient was discharged home on March 13, 1999.  Final pathology report; gallbladder showed acute and chronic cholelithiasis. Kidney showed renal cell carcinoma, right kidney, nuclear grade II to III. Tumor confined to the kidney.  Atherosclerosis, mild benign mucoid cyst.  CONDITION ON DISCHARGE:  Fair.  DISCHARGE DIAGNOSES: 1. Acute cholecystitis. 2. Renal cell carcinoma, right kidney.  DISCHARGE MEDICATIONS:  Darvocet.  FOLLOW-UP:  Plan for follow-up and staple removal with Verl Dicker, M.D. and [place]. Daphine Deutscher, M.D. in about one week. DD:  06/27/99 TD:  06/28/99 Job: [id number]

## 2010-08-04 NOTE — Discharge Summary (Signed)
[name], [name]                 ACCOUNT NO.:  [id number]   MEDICAL RECORD NO.:  [id number]          PATIENT TYPE:  INP   LOCATION:  3741                         FACILITY:  [facility]   PHYSICIAN:  Francisca December, M.D.  DATE OF BIRTH:  May 29, 1928   DATE OF ADMISSION:  04/16/2008  DATE OF DISCHARGE:  04/19/2008                               DISCHARGE SUMMARY   DISCHARGE DIAGNOSES:  1. Sinus bradycardia felt likely to be induced by beta-blocker eye      drops, timolol.  2. Glaucoma.  3. Constipation.  4. Rheumatoid arthritis.  5. Lung nodule with a negative CT of the chest.  6. Hypertension.  7. Dyslipidemia.  8. Right nephrectomy in 2000 secondary to renal cell carcinoma.  9. History of lung resection for a cryptococcal infection.  10.Left thigh melanoma, radiation therapy and surgery.  11.Status post cholecystectomy.  12.History of uterine prolapse with repair.  13.Long-term medication use.   Ashley [name] is a 77 year old female sitting on a chair on the date of  admission and the family saw her eyes rolling back into her head.  She  was unarousable for about a minute.  There was no confusion.  No  incontinence.  She denies any palpitations or chest pain.  The family  does not describe any shaking sensation such as seizure activity.  EMS  was summoned and her heart rate was in the 40s, and at that point she  was asymptomatic.  She does have a history of postural syncope secondary  to dehydration in 2006.  She was kept in the hospital over a 2-day  period, she felt well.  We kept her off the timolol eye drops for about  3 days.  Her heart rate was back into the 60s turning anywhere from 55-  90.  We felt her bradycardia was likely induced by beta-blocker eye  drops for glaucoma.  This is now resolved, and her heart rate has good  response with activity.  Therefore, we do not feel the patient needs a  permanent pacemaker.  We have asked her to see her ophthalmologist after  discharge for a new prescription for glaucoma.   DISCHARGE MEDICATIONS:  1. Hydrochlorothiazide 25 mg a day.  2. Baby aspirin daily.  3. __________ 470 mg daily.  4. Methylprednisolone 4 mg daily.  5. Trazodone 300 mg nightly.  6. Lovaza daily.  7. TriCor 145 mg daily.  8. Centrum daily.  9. Calcium with vitamin D 600 mg daily.  10.Prilosec 20 mg a day.   She was on 2 eye drops before admission and these have been stopped.  Again, she is to see her eye doctor as mentioned above.  She is to  follow up with Dr. Amil Amen on April 29, 2008, at 11:45 a.m.  She may  increase activity slowly, remain on a low-sodium, heart-healthy diet.       Guy Franco, P.A.      Francisca December, M.D.  Electronically Signed    LB/MEDQ  D:  05/31/2008  T:  06/01/2008  Job:  [id number]   cc:  Lianne Bushy, M.D.

## 2010-09-29 ENCOUNTER — Encounter: Payer: Self-pay | Admitting: Internal Medicine

## 2010-11-03 ENCOUNTER — Ambulatory Visit (INDEPENDENT_AMBULATORY_CARE_PROVIDER_SITE_OTHER): Payer: Medicare Other | Admitting: Internal Medicine

## 2010-11-03 ENCOUNTER — Other Ambulatory Visit: Payer: Self-pay | Admitting: Internal Medicine

## 2010-11-03 ENCOUNTER — Encounter: Payer: Self-pay | Admitting: Internal Medicine

## 2010-11-03 VITALS — Ht 66.0 in | Wt 125.8 lb

## 2010-11-03 DIAGNOSIS — I10 Essential (primary) hypertension: Secondary | ICD-10-CM

## 2010-11-03 DIAGNOSIS — R55 Syncope and collapse: Secondary | ICD-10-CM

## 2010-11-03 NOTE — Assessment & Plan Note (Signed)
Her blood pressure is well controlled. She will continue her low-dose diuretic. BP equal 127/78.

## 2010-11-03 NOTE — Patient Instructions (Signed)
Your physician wants you to follow-up in: 6 months with Dr Taylor You will receive a reminder letter in the mail two months in advance. If you don't receive a letter, please call our office to schedule the follow-up appointment.  

## 2010-11-03 NOTE — Assessment & Plan Note (Signed)
She's had no recurrent spells. She will undergo a period of watchful waiting. Will recheck her implantable loop recorder in several months.

## 2010-11-03 NOTE — Progress Notes (Signed)
HPI Ashley [name] returns today for followup. She is a pleasant 77 year old woman with a history of hypertension and syncope. She is status post insertion of an implantable loop recorder. Since then she has had no recurrent syncope. She notes occasional palpitations when she lays down at night. These have not awakened her from sleep but are present immediately on lying down. She denies peripheral edema. No chest pain and no shortness of breath. Not on File   Current Outpatient Prescriptions  Medication Sig Dispense Refill  . bromfenac (XIBROM) 0.09 % ophthalmic solution Place 1 drop into both eyes 2 (two) times daily.        . Calcium Carbonate-Vitamin D 600-400 MG-UNIT per tablet Take 1 tablet by mouth 2 (two) times daily.        . cycloSPORINE (RESTASIS) 0.05 % ophthalmic emulsion Place 1 drop into both eyes 2 (two) times daily.        . ergocalciferol (VITAMIN D2) 50000 UNITS capsule Take 50,000 Units by mouth once a week.        . fenofibrate (TRICOR) 145 MG tablet Take 145 mg by mouth daily.        . hydrochlorothiazide (HYDRODIURIL) 12.5 MG tablet Take 12.5 mg by mouth daily.        . Multiple Vitamin (MULTIVITAMIN) capsule Take 1 capsule by mouth daily.        Marland Kitchen omega-3 acid ethyl esters (LOVAZA) 1 G capsule Take 1 g by mouth daily. Taking 1 1/2 tab daily      . sulfaSALAzine (AZULFIDINE) 500 MG tablet Take 1,000 mg by mouth 2 (two) times daily.        . trazodone (DESYREL) 300 MG tablet Take 300 mg by mouth at bedtime.        Marland Kitchen warfarin (COUMADIN) 5 MG tablet Use as directed by the Anticoagulation Clinic          Past Medical History  Diagnosis Date  . Syncope   . HTN (hypertension)     ROS:   All systems reviewed and negative except as noted in the HPI.   Past Surgical History  Procedure Date  . Nephrectomy     native  . Eye surgery   . Bladder tact   . Partial removal of lung   . Tumor removed from left eye      Family History  Problem Relation Age of Onset  .  Heart disease Maternal Aunt      History   Social History  . Marital Status: Married    Spouse Name: N/A    Number of Children: N/A  . Years of Education: N/A   Occupational History  . retired    Social History Main Topics  . Smoking status: Never Smoker   . Smokeless tobacco: Not on file  . Alcohol Use: No  . Drug Use: No  . Sexually Active: Not on file   Other Topics Concern  . Not on file   Social History Narrative  . No narrative on file     Ht 5\' 6"  (1.676 m)  Wt 125 lb 12.8 oz (57.063 kg)  BMI 20.30 kg/m2  Physical Exam:  Well appearing elderly woman, NAD HEENT: Unremarkable Neck:  No JVD, no thyromegally Lymphatics:  No adenopathy Back:  No CVA tenderness Lungs:  Clear. Well-healed implantable loop insertion incision HEART:  Regular rate rhythm, no murmurs, no rubs, no clicks Abd:  soft, positive bowel sounds, no organomegally, no rebound, no guarding Ext:  2  plus pulses, no edema, no cyanosis, no clubbing Skin:  No rashes no nodules Neuro:  CN II through XII intact, motor grossly intact  DEVICE  Normal device function.  See PaceArt for details.   Assess/Plan:

## 2010-12-18 ENCOUNTER — Other Ambulatory Visit (HOSPITAL_COMMUNITY): Payer: Self-pay | Admitting: Family Medicine

## 2010-12-18 ENCOUNTER — Other Ambulatory Visit: Payer: Self-pay | Admitting: Family Medicine

## 2010-12-18 DIAGNOSIS — Z1231 Encounter for screening mammogram for malignant neoplasm of breast: Secondary | ICD-10-CM

## 2010-12-25 ENCOUNTER — Ambulatory Visit
Admission: RE | Admit: 2010-12-25 | Discharge: 2010-12-25 | Disposition: A | Discharge status: Home / Self Care | Payer: Medicare Other | Source: Ambulatory Visit | Attending: Family Medicine | Admitting: Family Medicine

## 2010-12-25 ENCOUNTER — Ambulatory Visit: Payer: Medicare Other

## 2010-12-25 DIAGNOSIS — Z1231 Encounter for screening mammogram for malignant neoplasm of breast: Secondary | ICD-10-CM

## 2011-01-02 ENCOUNTER — Ambulatory Visit (HOSPITAL_COMMUNITY): Payer: Medicare Other

## 2011-02-15 ENCOUNTER — Ambulatory Visit (INDEPENDENT_AMBULATORY_CARE_PROVIDER_SITE_OTHER): Payer: Medicare Other | Admitting: *Deleted

## 2011-02-15 ENCOUNTER — Encounter: Payer: Self-pay | Admitting: Internal Medicine

## 2011-02-15 DIAGNOSIS — R55 Syncope and collapse: Secondary | ICD-10-CM

## 2011-03-16 ENCOUNTER — Encounter: Payer: Self-pay | Admitting: Internal Medicine

## 2011-03-22 DIAGNOSIS — Z7901 Long term (current) use of anticoagulants: Secondary | ICD-10-CM | POA: Diagnosis not present

## 2011-03-22 DIAGNOSIS — I4891 Unspecified atrial fibrillation: Secondary | ICD-10-CM | POA: Diagnosis not present

## 2011-04-02 DIAGNOSIS — Z7901 Long term (current) use of anticoagulants: Secondary | ICD-10-CM | POA: Diagnosis not present

## 2011-04-02 DIAGNOSIS — I4891 Unspecified atrial fibrillation: Secondary | ICD-10-CM | POA: Diagnosis not present

## 2011-05-01 DIAGNOSIS — G4700 Insomnia, unspecified: Secondary | ICD-10-CM | POA: Diagnosis not present

## 2011-05-01 DIAGNOSIS — E782 Mixed hyperlipidemia: Secondary | ICD-10-CM | POA: Diagnosis not present

## 2011-05-01 DIAGNOSIS — Z79899 Other long term (current) drug therapy: Secondary | ICD-10-CM | POA: Diagnosis not present

## 2011-05-01 DIAGNOSIS — I4891 Unspecified atrial fibrillation: Secondary | ICD-10-CM | POA: Diagnosis not present

## 2011-05-01 DIAGNOSIS — I10 Essential (primary) hypertension: Secondary | ICD-10-CM | POA: Diagnosis not present

## 2011-05-03 DIAGNOSIS — I495 Sick sinus syndrome: Secondary | ICD-10-CM | POA: Diagnosis not present

## 2011-05-03 DIAGNOSIS — R55 Syncope and collapse: Secondary | ICD-10-CM | POA: Diagnosis not present

## 2011-05-03 DIAGNOSIS — Z7901 Long term (current) use of anticoagulants: Secondary | ICD-10-CM | POA: Diagnosis not present

## 2011-05-03 DIAGNOSIS — I4891 Unspecified atrial fibrillation: Secondary | ICD-10-CM | POA: Diagnosis not present

## 2011-05-14 ENCOUNTER — Encounter: Payer: Self-pay | Admitting: Internal Medicine

## 2011-05-14 ENCOUNTER — Other Ambulatory Visit: Payer: Self-pay

## 2011-05-15 DIAGNOSIS — M069 Rheumatoid arthritis, unspecified: Secondary | ICD-10-CM | POA: Diagnosis not present

## 2011-05-15 DIAGNOSIS — M25549 Pain in joints of unspecified hand: Secondary | ICD-10-CM | POA: Diagnosis not present

## 2011-05-15 DIAGNOSIS — M1990 Unspecified osteoarthritis, unspecified site: Secondary | ICD-10-CM | POA: Diagnosis not present

## 2011-05-22 ENCOUNTER — Encounter: Payer: Self-pay | Admitting: Internal Medicine

## 2011-05-22 ENCOUNTER — Ambulatory Visit (INDEPENDENT_AMBULATORY_CARE_PROVIDER_SITE_OTHER): Payer: Medicare Other | Admitting: Internal Medicine

## 2011-05-22 DIAGNOSIS — R55 Syncope and collapse: Secondary | ICD-10-CM | POA: Diagnosis not present

## 2011-05-22 DIAGNOSIS — I10 Essential (primary) hypertension: Secondary | ICD-10-CM | POA: Diagnosis not present

## 2011-05-22 NOTE — Assessment & Plan Note (Signed)
Her blood pressure today appears to be well controlled. She will continue a low-sodium diet and her current medical therapy.

## 2011-05-22 NOTE — Patient Instructions (Addendum)
Your physician wants you to follow-up in:3 months in device clinic 6 months with Dr Court Joy will receive a reminder letter in the mail two months in advance. If you don't receive a letter, please call our office to schedule the follow-up appointment.

## 2011-05-22 NOTE — Assessment & Plan Note (Signed)
Her device demonstrates asymptomatic bradycardia. At the present time I would recommend additional watchful waiting. If her loop recorder demonstrates any symptomatic bradycardia then a permanent pacemaker would be recommended.

## 2011-05-22 NOTE — Progress Notes (Signed)
HPI Ashley [name] returns today for followup. She is a very pleasant 78 year old woman with unexplained syncope status post insertion of an implantable loop recorder. She also has hypertension and dyslipidemia. Since we last saw the patient, she has had no syncope. She denies near syncope. She has no palpitations. She denies peripheral edema or weight loss. No Known Allergies   Current Outpatient Prescriptions  Medication Sig Dispense Refill  . bromfenac (XIBROM) 0.09 % ophthalmic solution Place 1 drop into both eyes 2 (two) times daily.        . Calcium Carbonate-Vitamin D 600-400 MG-UNIT per tablet Take 1 tablet by mouth 2 (two) times daily.        . cycloSPORINE (RESTASIS) 0.05 % ophthalmic emulsion Place 1 drop into both eyes 2 (two) times daily.        . ergocalciferol (VITAMIN D2) 50000 UNITS capsule Take 50,000 Units by mouth once a week.        . fenofibrate (TRICOR) 145 MG tablet Take 145 mg by mouth daily.        . hydrochlorothiazide (HYDRODIURIL) 12.5 MG tablet Take 12.5 mg by mouth daily.        . methylPREDNISolone (MEDROL) 4 MG tablet Take 6 mg by mouth daily.        . Multiple Vitamin (MULTIVITAMIN) capsule Take 1 capsule by mouth daily.        Marland Kitchen omega-3 acid ethyl esters (LOVAZA) 1 G capsule Take 1 g by mouth daily. Taking 1 1/2 tab daily      . sulfaSALAzine (AZULFIDINE) 500 MG tablet Take 1,000 mg by mouth 2 (two) times daily.        . trazodone (DESYREL) 300 MG tablet Take 300 mg by mouth at bedtime.        Marland Kitchen warfarin (COUMADIN) 5 MG tablet Use as directed by the Anticoagulation Clinic          Past Medical History  Diagnosis Date  . Syncope   . HTN (hypertension)   . Rheumatoid arthritis   . Choroid melanoma of left eye   . Uterine prolapse   . Cryptococcal pneumonitis     right lower lobe  . Renal cell carcinoma   . GERD (gastroesophageal reflux disease)     ROS:   All systems reviewed and negative except as noted in the HPI.   Past Surgical History    Procedure Date  . Nephrectomy     native  . Eye surgery   . Bladder tact   . Partial removal of lung   . Tumor removed from left eye   . Cholecystectomy   . Brain meningioma excision      Family History  Problem Relation Age of Onset  . Heart disease Maternal Aunt      History   Social History  . Marital Status: Married    Spouse Name: N/A    Number of Children: N/A  . Years of Education: N/A   Occupational History  . retired    Social History Main Topics  . Smoking status: Never Smoker   . Smokeless tobacco: Not on file  . Alcohol Use: No  . Drug Use: No  . Sexually Active: Not on file   Other Topics Concern  . Not on file   Social History Narrative  . No narrative on file     BP 124/70  Pulse 74  Wt 58.06 kg (128 lb)  Physical Exam:  Well appearing elderly woman, NAD HEENT: Unremarkable  Neck:  No JVD, no thyromegally Lungs:  Clear with no wheezes, rales, or rhonchi. HEART:  Regular bradycardic rate and rhythm, no murmurs, no rubs, no clicks Abd:  soft, positive bowel sounds, no organomegally, no rebound, no guarding Ext:  2 plus pulses, no edema, no cyanosis, no clubbing Skin:  No rashes no nodules Neuro:  CN II through XII intact, motor grossly intact  DEVICE  Normal device function.  See PaceArt for details. Her implantable loop recorder demonstrates pauses up to 2 seconds.  Assess/Plan:

## 2011-05-31 DIAGNOSIS — I4891 Unspecified atrial fibrillation: Secondary | ICD-10-CM | POA: Diagnosis not present

## 2011-05-31 DIAGNOSIS — Z7901 Long term (current) use of anticoagulants: Secondary | ICD-10-CM | POA: Diagnosis not present

## 2011-06-12 ENCOUNTER — Telehealth: Payer: Self-pay | Admitting: Internal Medicine

## 2011-06-12 NOTE — Telephone Encounter (Signed)
Spoke with husband  She got very dizzy on Mon 06/11/11 around 5:00pm  It lasted for a few minuets

## 2011-06-12 NOTE — Telephone Encounter (Signed)
Will have them come in tomorrow at 2pm to the device clinic to have her ILR interrogated to determine if symptoms correlate with bradycardia.  If they do will need to proceed with PPM implant per Dr Ladona Ridgel if not will continue to monitor per Dr Ladona Ridgel

## 2011-06-12 NOTE — Telephone Encounter (Signed)
New Problem:    Patient's husband called concerned because his wife is beginning to have dizzy spells again.  Please call back.

## 2011-06-13 ENCOUNTER — Encounter: Payer: Self-pay | Admitting: Internal Medicine

## 2011-06-13 ENCOUNTER — Ambulatory Visit (INDEPENDENT_AMBULATORY_CARE_PROVIDER_SITE_OTHER): Payer: Medicare Other | Admitting: *Deleted

## 2011-06-13 DIAGNOSIS — R55 Syncope and collapse: Secondary | ICD-10-CM

## 2011-06-13 LAB — PACEMAKER DEVICE OBSERVATION

## 2011-06-13 NOTE — Progress Notes (Signed)
ILR check 

## 2011-08-10 DIAGNOSIS — H5200 Hypermetropia, unspecified eye: Secondary | ICD-10-CM | POA: Diagnosis not present

## 2011-08-10 DIAGNOSIS — H4011X0 Primary open-angle glaucoma, stage unspecified: Secondary | ICD-10-CM | POA: Diagnosis not present

## 2011-08-10 DIAGNOSIS — H04129 Dry eye syndrome of unspecified lacrimal gland: Secondary | ICD-10-CM | POA: Diagnosis not present

## 2011-08-14 DIAGNOSIS — Z7901 Long term (current) use of anticoagulants: Secondary | ICD-10-CM | POA: Diagnosis not present

## 2011-08-14 DIAGNOSIS — I4891 Unspecified atrial fibrillation: Secondary | ICD-10-CM | POA: Diagnosis not present

## 2011-08-22 ENCOUNTER — Encounter: Payer: Self-pay | Admitting: Internal Medicine

## 2011-08-22 ENCOUNTER — Ambulatory Visit (INDEPENDENT_AMBULATORY_CARE_PROVIDER_SITE_OTHER): Payer: Medicare Other | Admitting: *Deleted

## 2011-08-22 DIAGNOSIS — R55 Syncope and collapse: Secondary | ICD-10-CM | POA: Diagnosis not present

## 2011-08-22 LAB — PACEMAKER DEVICE OBSERVATION

## 2011-08-22 NOTE — Progress Notes (Signed)
ILR check 

## 2011-08-29 DIAGNOSIS — Z7901 Long term (current) use of anticoagulants: Secondary | ICD-10-CM | POA: Diagnosis not present

## 2011-08-29 DIAGNOSIS — I4891 Unspecified atrial fibrillation: Secondary | ICD-10-CM | POA: Diagnosis not present

## 2011-09-12 DIAGNOSIS — M1990 Unspecified osteoarthritis, unspecified site: Secondary | ICD-10-CM | POA: Diagnosis not present

## 2011-09-12 DIAGNOSIS — I4891 Unspecified atrial fibrillation: Secondary | ICD-10-CM | POA: Diagnosis not present

## 2011-09-12 DIAGNOSIS — M069 Rheumatoid arthritis, unspecified: Secondary | ICD-10-CM | POA: Diagnosis not present

## 2011-09-12 DIAGNOSIS — Z7901 Long term (current) use of anticoagulants: Secondary | ICD-10-CM | POA: Diagnosis not present

## 2011-09-12 DIAGNOSIS — M949 Disorder of cartilage, unspecified: Secondary | ICD-10-CM | POA: Diagnosis not present

## 2011-09-12 DIAGNOSIS — M899 Disorder of bone, unspecified: Secondary | ICD-10-CM | POA: Diagnosis not present

## 2011-10-19 DIAGNOSIS — I4891 Unspecified atrial fibrillation: Secondary | ICD-10-CM | POA: Diagnosis not present

## 2011-10-19 DIAGNOSIS — Z7901 Long term (current) use of anticoagulants: Secondary | ICD-10-CM | POA: Diagnosis not present

## 2011-10-30 DIAGNOSIS — E782 Mixed hyperlipidemia: Secondary | ICD-10-CM | POA: Diagnosis not present

## 2011-10-30 DIAGNOSIS — Z79899 Other long term (current) drug therapy: Secondary | ICD-10-CM | POA: Diagnosis not present

## 2011-10-30 DIAGNOSIS — G4700 Insomnia, unspecified: Secondary | ICD-10-CM | POA: Diagnosis not present

## 2011-10-30 DIAGNOSIS — I10 Essential (primary) hypertension: Secondary | ICD-10-CM | POA: Diagnosis not present

## 2011-10-30 DIAGNOSIS — I4891 Unspecified atrial fibrillation: Secondary | ICD-10-CM | POA: Diagnosis not present

## 2011-11-01 DIAGNOSIS — I4891 Unspecified atrial fibrillation: Secondary | ICD-10-CM | POA: Diagnosis not present

## 2011-11-01 DIAGNOSIS — I10 Essential (primary) hypertension: Secondary | ICD-10-CM | POA: Diagnosis not present

## 2011-11-01 DIAGNOSIS — Z7901 Long term (current) use of anticoagulants: Secondary | ICD-10-CM | POA: Diagnosis not present

## 2011-11-01 DIAGNOSIS — E782 Mixed hyperlipidemia: Secondary | ICD-10-CM | POA: Diagnosis not present

## 2011-11-01 DIAGNOSIS — I495 Sick sinus syndrome: Secondary | ICD-10-CM | POA: Diagnosis not present

## 2011-11-16 DIAGNOSIS — Z7901 Long term (current) use of anticoagulants: Secondary | ICD-10-CM | POA: Diagnosis not present

## 2011-11-16 DIAGNOSIS — I4891 Unspecified atrial fibrillation: Secondary | ICD-10-CM | POA: Diagnosis not present

## 2011-11-28 ENCOUNTER — Other Ambulatory Visit: Payer: Self-pay | Admitting: Family Medicine

## 2011-11-28 DIAGNOSIS — Z1231 Encounter for screening mammogram for malignant neoplasm of breast: Secondary | ICD-10-CM

## 2011-12-04 ENCOUNTER — Encounter: Payer: Medicare Other | Admitting: Internal Medicine

## 2011-12-11 ENCOUNTER — Encounter: Payer: Self-pay | Admitting: Internal Medicine

## 2011-12-11 ENCOUNTER — Ambulatory Visit (INDEPENDENT_AMBULATORY_CARE_PROVIDER_SITE_OTHER): Payer: Medicare Other | Admitting: Internal Medicine

## 2011-12-11 VITALS — BP 140/68 | HR 62 | Ht 65.0 in | Wt 130.8 lb

## 2011-12-11 DIAGNOSIS — R55 Syncope and collapse: Secondary | ICD-10-CM | POA: Diagnosis not present

## 2011-12-11 DIAGNOSIS — I10 Essential (primary) hypertension: Secondary | ICD-10-CM

## 2011-12-11 DIAGNOSIS — Z23 Encounter for immunization: Secondary | ICD-10-CM | POA: Diagnosis not present

## 2011-12-11 DIAGNOSIS — Z7901 Long term (current) use of anticoagulants: Secondary | ICD-10-CM | POA: Diagnosis not present

## 2011-12-11 DIAGNOSIS — I4891 Unspecified atrial fibrillation: Secondary | ICD-10-CM | POA: Diagnosis not present

## 2011-12-11 NOTE — Assessment & Plan Note (Signed)
The etiology of her syncope remains unclear. Her implantable loop recorder demonstrates no significant bradycardia. She will undergo watchful waiting.

## 2011-12-11 NOTE — Progress Notes (Signed)
HPI Mrs. Pring returns today for followup. She is a very pleasant elderly woman with a history of unexplained syncope, hypertension, and bradycardia. She is status post insertion of an implantable loop recorder. In the interim, she has had occasional dizzy spell but no frank syncope. She denies chest pain or shortness of breath. Since she lost some of her vision, she admits to being more sedentary. She notes that her husband will often cook dinner for her. No Known Allergies   Current Outpatient Prescriptions  Medication Sig Dispense Refill  . bromfenac (XIBROM) 0.09 % ophthalmic solution Place 1 drop into both eyes 2 (two) times daily.        . cycloSPORINE (RESTASIS) 0.05 % ophthalmic emulsion Place 1 drop into both eyes 2 (two) times daily.        . ergocalciferol (VITAMIN D2) 50000 UNITS capsule Take 50,000 Units by mouth once a week.        . fenofibrate (TRICOR) 145 MG tablet Take 145 mg by mouth daily.        . hydrochlorothiazide (HYDRODIURIL) 12.5 MG tablet Take 12.5 mg by mouth daily.        . methylPREDNISolone (MEDROL) 4 MG tablet Take 6 mg by mouth daily.        . Multiple Vitamin (MULTIVITAMIN) capsule Take 1 capsule by mouth daily.        Marland Kitchen omega-3 acid ethyl esters (LOVAZA) 1 G capsule Take 1 g by mouth daily. Taking 1 1/2 tab daily      . sulfaSALAzine (AZULFIDINE) 500 MG tablet Take 1,000 mg by mouth 2 (two) times daily.        . trazodone (DESYREL) 300 MG tablet Take 300 mg by mouth at bedtime.        Marland Kitchen warfarin (COUMADIN) 5 MG tablet Use as directed by the Anticoagulation Clinic          Past Medical History  Diagnosis Date  . Syncope   . HTN (hypertension)   . Rheumatoid arthritis   . Choroid melanoma of left eye   . Uterine prolapse   . Cryptococcal pneumonitis     right lower lobe  . Renal cell carcinoma   . GERD (gastroesophageal reflux disease)     ROS:   All systems reviewed and negative except as noted in the HPI.   Past Surgical History  Procedure  Date  . Nephrectomy     native  . Eye surgery   . Bladder tact   . Partial removal of lung   . Tumor removed from left eye   . Cholecystectomy   . Brain meningioma excision      Family History  Problem Relation Age of Onset  . Heart disease Maternal Aunt      History   Social History  . Marital Status: Married    Spouse Name: N/A    Number of Children: N/A  . Years of Education: N/A   Occupational History  . retired    Social History Main Topics  . Smoking status: Never Smoker   . Smokeless tobacco: Not on file  . Alcohol Use: No  . Drug Use: No  . Sexually Active: Not on file   Other Topics Concern  . Not on file   Social History Narrative  . No narrative on file     BP 140/68  Pulse 62  Ht 5\' 5"  (1.651 m)  Wt 130 lb 12.8 oz (59.33 kg)  BMI 21.77 kg/m2  Physical Exam:  Well appearing elderly woman, NAD HEENT: Unremarkable Neck:  No JVD, no thyromegally Lungs:  Clear with no wheezes, rales, or rhonchi. HEART:  Regular rate rhythm, no murmurs, no rubs, no clicks. Well-healed implantable loop recorder incision. Abd:  soft, positive bowel sounds, no organomegally, no rebound, no guarding Ext:  2 plus pulses, no edema, no cyanosis, no clubbing Skin:  No rashes no nodules Neuro:  CN II through XII intact, motor grossly intact  DEVICE  Normal device function.  See PaceArt for details.   Assess/Plan:

## 2011-12-11 NOTE — Patient Instructions (Addendum)
Your physician wants you to follow-up in: 6 months with device clinic and 12 months with Dr Taylor You will receive a reminder letter in the mail two months in advance. If you don't receive a letter, please call our office to schedule the follow-up appointment.  

## 2011-12-11 NOTE — Assessment & Plan Note (Signed)
Her blood pressure today is minimally elevated. Because some of her symptoms may be related to orthostasis, I will not aggressively try to lower her blood pressure with more medical therapy.

## 2011-12-27 ENCOUNTER — Ambulatory Visit
Admission: RE | Admit: 2011-12-27 | Discharge: 2011-12-27 | Disposition: A | Discharge status: Home / Self Care | Payer: Medicare Other | Source: Ambulatory Visit | Attending: Family Medicine | Admitting: Family Medicine

## 2011-12-27 DIAGNOSIS — Z1231 Encounter for screening mammogram for malignant neoplasm of breast: Secondary | ICD-10-CM | POA: Diagnosis not present

## 2012-01-14 DIAGNOSIS — I4891 Unspecified atrial fibrillation: Secondary | ICD-10-CM | POA: Diagnosis not present

## 2012-01-14 DIAGNOSIS — Z7901 Long term (current) use of anticoagulants: Secondary | ICD-10-CM | POA: Diagnosis not present

## 2012-02-12 DIAGNOSIS — H4011X0 Primary open-angle glaucoma, stage unspecified: Secondary | ICD-10-CM | POA: Diagnosis not present

## 2012-02-12 DIAGNOSIS — H04129 Dry eye syndrome of unspecified lacrimal gland: Secondary | ICD-10-CM | POA: Diagnosis not present

## 2012-02-25 DIAGNOSIS — I4891 Unspecified atrial fibrillation: Secondary | ICD-10-CM | POA: Diagnosis not present

## 2012-02-25 DIAGNOSIS — Z7901 Long term (current) use of anticoagulants: Secondary | ICD-10-CM | POA: Diagnosis not present

## 2012-03-10 DIAGNOSIS — M1990 Unspecified osteoarthritis, unspecified site: Secondary | ICD-10-CM | POA: Diagnosis not present

## 2012-03-10 DIAGNOSIS — M79609 Pain in unspecified limb: Secondary | ICD-10-CM | POA: Diagnosis not present

## 2012-03-10 DIAGNOSIS — M069 Rheumatoid arthritis, unspecified: Secondary | ICD-10-CM | POA: Diagnosis not present

## 2012-03-10 DIAGNOSIS — M949 Disorder of cartilage, unspecified: Secondary | ICD-10-CM | POA: Diagnosis not present

## 2012-03-10 DIAGNOSIS — M899 Disorder of bone, unspecified: Secondary | ICD-10-CM | POA: Diagnosis not present

## 2012-03-10 DIAGNOSIS — Z7901 Long term (current) use of anticoagulants: Secondary | ICD-10-CM | POA: Diagnosis not present

## 2012-03-10 DIAGNOSIS — I4891 Unspecified atrial fibrillation: Secondary | ICD-10-CM | POA: Diagnosis not present

## 2012-03-20 DIAGNOSIS — Z7901 Long term (current) use of anticoagulants: Secondary | ICD-10-CM | POA: Diagnosis not present

## 2012-03-20 DIAGNOSIS — I4891 Unspecified atrial fibrillation: Secondary | ICD-10-CM | POA: Diagnosis not present

## 2012-04-10 DIAGNOSIS — Z7901 Long term (current) use of anticoagulants: Secondary | ICD-10-CM | POA: Diagnosis not present

## 2012-04-10 DIAGNOSIS — I4891 Unspecified atrial fibrillation: Secondary | ICD-10-CM | POA: Diagnosis not present

## 2012-04-29 DIAGNOSIS — H04129 Dry eye syndrome of unspecified lacrimal gland: Secondary | ICD-10-CM | POA: Diagnosis not present

## 2012-04-29 DIAGNOSIS — H4011X0 Primary open-angle glaucoma, stage unspecified: Secondary | ICD-10-CM | POA: Diagnosis not present

## 2012-05-05 DIAGNOSIS — R109 Unspecified abdominal pain: Secondary | ICD-10-CM | POA: Diagnosis not present

## 2012-05-05 DIAGNOSIS — K625 Hemorrhage of anus and rectum: Secondary | ICD-10-CM | POA: Diagnosis not present

## 2012-05-06 DIAGNOSIS — K921 Melena: Secondary | ICD-10-CM | POA: Diagnosis not present

## 2012-05-06 DIAGNOSIS — R109 Unspecified abdominal pain: Secondary | ICD-10-CM | POA: Diagnosis not present

## 2012-05-06 DIAGNOSIS — R198 Other specified symptoms and signs involving the digestive system and abdomen: Secondary | ICD-10-CM | POA: Diagnosis not present

## 2012-05-07 DIAGNOSIS — K625 Hemorrhage of anus and rectum: Secondary | ICD-10-CM | POA: Diagnosis not present

## 2012-05-07 DIAGNOSIS — I495 Sick sinus syndrome: Secondary | ICD-10-CM | POA: Diagnosis not present

## 2012-05-07 DIAGNOSIS — I10 Essential (primary) hypertension: Secondary | ICD-10-CM | POA: Diagnosis not present

## 2012-05-07 DIAGNOSIS — I4891 Unspecified atrial fibrillation: Secondary | ICD-10-CM | POA: Diagnosis not present

## 2012-05-07 DIAGNOSIS — Z7901 Long term (current) use of anticoagulants: Secondary | ICD-10-CM | POA: Diagnosis not present

## 2012-05-09 ENCOUNTER — Other Ambulatory Visit: Payer: Self-pay | Admitting: Gastroenterology

## 2012-05-09 DIAGNOSIS — R634 Abnormal weight loss: Secondary | ICD-10-CM

## 2012-05-09 DIAGNOSIS — C649 Malignant neoplasm of unspecified kidney, except renal pelvis: Secondary | ICD-10-CM

## 2012-05-09 DIAGNOSIS — R109 Unspecified abdominal pain: Secondary | ICD-10-CM

## 2012-05-09 DIAGNOSIS — R194 Change in bowel habit: Secondary | ICD-10-CM

## 2012-05-13 ENCOUNTER — Ambulatory Visit
Admission: RE | Admit: 2012-05-13 | Discharge: 2012-05-13 | Disposition: A | Discharge status: Home / Self Care | Payer: Medicare Other | Source: Ambulatory Visit | Attending: Gastroenterology | Admitting: Gastroenterology

## 2012-05-13 DIAGNOSIS — R109 Unspecified abdominal pain: Secondary | ICD-10-CM

## 2012-05-13 DIAGNOSIS — R634 Abnormal weight loss: Secondary | ICD-10-CM

## 2012-05-13 DIAGNOSIS — R194 Change in bowel habit: Secondary | ICD-10-CM

## 2012-05-13 DIAGNOSIS — C649 Malignant neoplasm of unspecified kidney, except renal pelvis: Secondary | ICD-10-CM

## 2012-05-13 MED ORDER — IOHEXOL 300 MG/ML  SOLN
100.0000 mL | Freq: Once | INTRAMUSCULAR | Status: AC | PRN
  Administered 2012-05-13: 100 mL via INTRAVENOUS

## 2012-05-14 DIAGNOSIS — M069 Rheumatoid arthritis, unspecified: Secondary | ICD-10-CM | POA: Diagnosis not present

## 2012-05-14 DIAGNOSIS — M899 Disorder of bone, unspecified: Secondary | ICD-10-CM | POA: Diagnosis not present

## 2012-05-14 DIAGNOSIS — M1990 Unspecified osteoarthritis, unspecified site: Secondary | ICD-10-CM | POA: Diagnosis not present

## 2012-05-15 ENCOUNTER — Encounter (HOSPITAL_COMMUNITY): Payer: Self-pay | Admitting: Pharmacy Technician

## 2012-05-16 ENCOUNTER — Encounter (HOSPITAL_COMMUNITY): Payer: Self-pay | Admitting: *Deleted

## 2012-05-16 NOTE — Pre-Procedure Instructions (Signed)
Your procedure is scheduled on:Wednesday, May 21, 2012 Report to [facility] Admitting [id number] Call this number if you have problems morning of your procedure:[phone]  Follow all bowel prep instructions per your doctor's orders.  Do not eat or drink anything after midnight the night before your procedure. You may brush your teeth, rinse out your mouth, but no water, no food, no chewing gum, no mints, no candies, no chewing tobacco.     Take these medicines the morning of your procedure with A SIP OF WATER:Use eye drops the morning of the procedure as usual spouse Tahlia Deamer [phone]  Stopped her Coumadin 05-15-2012 per Dr. Hulen Shouts instructions   Please make arrangements for a responsible person to drive you home after the procedure. You cannot go home by cab/taxi. We recommend you have someone with you at home the first 24 hours after your procedure. Driver for procedure is  LEAVE ALL VALUABLES, JEWELRY, BILLFOLD AT HOME.  NO DENTURES, CONTACT LENSES ALLOWED IN THE ENDOSCOPY ROOM.   YOU MAY WEAR DEODORANT, PLEASE REMOVE ALL JEWELRY, WATCHES RINGS, BODY PIERCINGS AND LEAVE AT HOME.   WOMEN: NO MAKE-UP, LOTIONS PERFUMES

## 2012-05-21 ENCOUNTER — Encounter (HOSPITAL_COMMUNITY)
Admission: RE | Disposition: A | Discharge status: Home / Self Care | Payer: Self-pay | Source: Ambulatory Visit | Attending: Gastroenterology

## 2012-05-21 ENCOUNTER — Ambulatory Visit (HOSPITAL_COMMUNITY)
Admission: RE | Admit: 2012-05-21 | Discharge: 2012-05-21 | Disposition: A | Discharge status: Home / Self Care | Payer: Medicare Other | Source: Ambulatory Visit | Attending: Gastroenterology | Admitting: Gastroenterology

## 2012-05-21 ENCOUNTER — Encounter (HOSPITAL_COMMUNITY): Payer: Self-pay | Admitting: Anesthesiology

## 2012-05-21 ENCOUNTER — Encounter (HOSPITAL_COMMUNITY): Payer: Self-pay

## 2012-05-21 ENCOUNTER — Ambulatory Visit (HOSPITAL_COMMUNITY): Payer: Medicare Other | Admitting: Anesthesiology

## 2012-05-21 DIAGNOSIS — K921 Melena: Secondary | ICD-10-CM | POA: Insufficient documentation

## 2012-05-21 DIAGNOSIS — K5730 Diverticulosis of large intestine without perforation or abscess without bleeding: Secondary | ICD-10-CM | POA: Insufficient documentation

## 2012-05-21 DIAGNOSIS — K633 Ulcer of intestine: Secondary | ICD-10-CM | POA: Insufficient documentation

## 2012-05-21 DIAGNOSIS — D126 Benign neoplasm of colon, unspecified: Secondary | ICD-10-CM | POA: Diagnosis not present

## 2012-05-21 DIAGNOSIS — K644 Residual hemorrhoidal skin tags: Secondary | ICD-10-CM | POA: Diagnosis not present

## 2012-05-21 DIAGNOSIS — Z7901 Long term (current) use of anticoagulants: Secondary | ICD-10-CM | POA: Diagnosis not present

## 2012-05-21 DIAGNOSIS — K6389 Other specified diseases of intestine: Secondary | ICD-10-CM | POA: Insufficient documentation

## 2012-05-21 DIAGNOSIS — R933 Abnormal findings on diagnostic imaging of other parts of digestive tract: Secondary | ICD-10-CM | POA: Insufficient documentation

## 2012-05-21 DIAGNOSIS — R109 Unspecified abdominal pain: Secondary | ICD-10-CM | POA: Diagnosis not present

## 2012-05-21 HISTORY — PX: COLONOSCOPY: SHX5424

## 2012-05-21 SURGERY — COLONOSCOPY
Anesthesia: Monitor Anesthesia Care

## 2012-05-21 MED ORDER — LIDOCAINE HCL (CARDIAC) 20 MG/ML IV SOLN
INTRAVENOUS | Status: DC | PRN
  Administered 2012-05-21: 100 mg via INTRAVENOUS

## 2012-05-21 MED ORDER — WARFARIN SODIUM 5 MG PO TABS: 5.0000 mg | ORAL_TABLET | Freq: Every day | ORAL | Status: DC

## 2012-05-21 MED ORDER — ONDANSETRON HCL 4 MG/2ML IJ SOLN
INTRAMUSCULAR | Status: DC | PRN
  Administered 2012-05-21: 4 mg via INTRAVENOUS

## 2012-05-21 MED ORDER — PROPOFOL INFUSION 10 MG/ML OPTIME
INTRAVENOUS | Status: DC | PRN
  Administered 2012-05-21: 120 ug/kg/min via INTRAVENOUS

## 2012-05-21 MED ORDER — KETAMINE HCL 10 MG/ML IJ SOLN
INTRAMUSCULAR | Status: DC | PRN
  Administered 2012-05-21: 30 mg via INTRAVENOUS

## 2012-05-21 MED ORDER — MIDAZOLAM HCL 5 MG/5ML IJ SOLN
INTRAMUSCULAR | Status: DC | PRN
  Administered 2012-05-21: 2 mg via INTRAVENOUS

## 2012-05-21 MED ORDER — SODIUM CHLORIDE 0.9 % IV SOLN: INTRAVENOUS | Status: DC

## 2012-05-21 MED ORDER — LACTATED RINGERS IV SOLN
INTRAVENOUS | Status: DC
  Administered 2012-05-21: 09:00:00 via INTRAVENOUS

## 2012-05-21 MED ORDER — FENTANYL CITRATE 0.05 MG/ML IJ SOLN
INTRAMUSCULAR | Status: DC | PRN
  Administered 2012-05-21: 50 ug via INTRAVENOUS

## 2012-05-21 NOTE — Preoperative (Signed)
Beta Blockers   Reason not to administer Beta Blockers:Not Applicable 

## 2012-05-21 NOTE — Anesthesia Preprocedure Evaluation (Signed)
Anesthesia Evaluation  Patient identified by MRN, date of birth, ID band Patient awake    Reviewed: Allergy & Precautions, H&P , NPO status , Patient's Chart, lab work & pertinent test results  Airway Mallampati: II TM Distance: >3 FB Neck ROM: Full    Dental no notable dental hx. (+) Edentulous Upper, Upper Dentures and Partial Lower   Pulmonary neg pulmonary ROS,  breath sounds clear to auscultation  Pulmonary exam normal       Cardiovascular hypertension, Pt. on medications negative cardio ROS  Rhythm:Regular Rate:Normal     Neuro/Psych negative neurological ROS  negative psych ROS   GI/Hepatic negative GI ROS, Neg liver ROS,   Endo/Other  negative endocrine ROS  Renal/GU negative Renal ROS  negative genitourinary   Musculoskeletal negative musculoskeletal ROS (+) Arthritis -,   Abdominal   Peds negative pediatric ROS (+)  Hematology negative hematology ROS (+)   Anesthesia Other Findings   Reproductive/Obstetrics negative OB ROS                           Anesthesia Physical Anesthesia Plan  ASA: III  Anesthesia Plan: MAC   Post-op Pain Management:    Induction: Intravenous  Airway Management Planned: Mask  Additional Equipment:   Intra-op Plan:   Post-operative Plan:   Informed Consent: I have reviewed the patients History and Physical, chart, labs and discussed the procedure including the risks, benefits and alternatives for the proposed anesthesia with the patient or authorized representative who has indicated his/her understanding and acceptance.   Dental advisory given  Plan Discussed with: CRNA  Anesthesia Plan Comments:         Anesthesia Quick Evaluation

## 2012-05-21 NOTE — Anesthesia Postprocedure Evaluation (Signed)
  Anesthesia Post-op Note  Patient: Ashley [name]  Procedure(s) Performed: Procedure(s) (LRB): COLONOSCOPY (N/A)  Patient Location: PACU  Anesthesia Type: MAC  Level of Consciousness: awake and alert   Airway and Oxygen Therapy: Patient Spontanous Breathing  Post-op Pain: mild  Post-op Assessment: Post-op Vital signs reviewed, Patient's Cardiovascular Status Stable, Respiratory Function Stable, Patent Airway and No signs of Nausea or vomiting  Last Vitals:  Filed Vitals:   05/21/12 1037  BP: 125/94  Temp:   Resp: 17    Post-op Vital Signs: stable   Complications: No apparent anesthesia complications

## 2012-05-21 NOTE — Op Note (Signed)
[facility] [address] [place], [address]   COLONOSCOPY PROCEDURE REPORT  PATIENT: Ashley [name], Ashley [name]  MR#: [id number] BIRTHDATE: 11-21-1928 , 83  yrs. old GENDER: Female ENDOSCOPIST: Willis Modena, MD REFERRED VO:ZDGUY Hamrick, M.D. PROCEDURE DATE:  05/21/2012 PROCEDURE:   Colonoscopy with snare polypectomy ASA CLASS:   Class III INDICATIONS:abdominal pain, blood in stool, abnormal CT abdomen (rectosigmoid thickening). MEDICATIONS: MAC sedation, administered by CRNA DESCRIPTION OF PROCEDURE:   After the risks benefits and alternatives of the procedure were thoroughly explained, informed consent was obtained.  A digital rectal exam revealed no abnormalities of the rectum.   The Pentax Ped Colon [id number] endoscope was introduced through the anus and advanced to the cecum, which was identified by both the appendix and ileocecal valve. No adverse events experienced.   The quality of the prep was good.  The instrument was then slowly withdrawn as the colon was fully examined.  Findings: Mild external hemorrhoids, otherwise digital rectal exam normal. Prep quality was good.  Diffuse melanosis coli throughout the colon.  Extensive distal sigmoid diverticulosis with luminal narrowing; with time and multiple position changes, I was able to traverse this region and subsequently reach the cecum. Scattered other diverticula seen in mild patchy distribution throughout the rest of the colon.  In the proximal ascending colon, there one small region of mucosal ulceration consistent with mild barotrauma. 5mm  transverse colon polyp removed with hot snare.  No other polyps, masses, vascular ectasias or inflammatory changes were seen.        Withdrawal time was about 10 minutes     .  The scope was withdrawn and the procedure completed. ENDOSCOPIC IMPRESSION:     As above.  Suspect prior bleeding was hemorrhoidal; diverticular bleeding not favored.  RECOMMENDATIONS:     1.   Watch for potential complications of procedure. 2.  Await polypectomy results. 3.  Restart warfarin tomorrow. 4.  Topical therapies (e.g., Preparation-H) as needed for hemorrhoidal bleeding. 5.  Would not repeat any further routine colonoscopies in light of patient's age and difficulty of today's colonoscopy. 6.  Follow-up with [facility] in 6-8 weeks. eSigned:  Willis Modena, MD 05/21/2012 10:10 AM   cc:

## 2012-05-21 NOTE — Transfer of Care (Signed)
Immediate Anesthesia Transfer of Care Note  Patient: Ashley [name]  Procedure(s) Performed: Procedure(s) (LRB): COLONOSCOPY (N/A)  Patient Location: PACU  Anesthesia Type: MAC  Level of Consciousness: sedated, patient cooperative and responds to stimulaton  Airway & Oxygen Therapy: Patient Spontanous Breathing and Patient connected to face mask oxgen  Post-op Assessment: Report given to PACU RN and Post -op Vital signs reviewed and stable  Post vital signs: Reviewed and stable  Complications: No apparent anesthesia complications

## 2012-05-21 NOTE — H&P (Signed)
Patient interval history reviewed.  Patient examined again.  There has been no change from documented H/P dated 05/06/12 (scanned into chart from our office) except as documented above.  Assessment:  1.  Blood in stool. 2.  Abdominal pain. 3.  Abnormal CT abdomen (rectosigmoid colon thickening).  Plan:  1.  Watch for potential complications of procedure. 2.  Risks (bleeding, infection, bowel perforation that could require surgery, sedation-related changes in cardiopulmonary systems), benefits (identification and possible treatment of source of symptoms, exclusion of certain causes of symptoms), and alternatives (watchful waiting, radiographic imaging studies, empiric medical treatment) of colonoscopy were explained to patient in detail and she wishes to proceed.

## 2012-05-22 ENCOUNTER — Ambulatory Visit (INDEPENDENT_AMBULATORY_CARE_PROVIDER_SITE_OTHER): Payer: Medicare Other | Admitting: Cardiology

## 2012-05-22 ENCOUNTER — Encounter (HOSPITAL_COMMUNITY): Payer: Self-pay | Admitting: Gastroenterology

## 2012-05-22 ENCOUNTER — Encounter: Payer: Self-pay | Admitting: Internal Medicine

## 2012-05-22 DIAGNOSIS — R55 Syncope and collapse: Secondary | ICD-10-CM | POA: Diagnosis not present

## 2012-05-22 LAB — PACEMAKER DEVICE OBSERVATION

## 2012-05-22 NOTE — Progress Notes (Signed)
ILR check/device clinic visit. Patient denies any complaints, specifically CP, SOB, palpitations, dizziness or syncope. Loop interrogation - no symptom episodes, brady/asystole episodes due to undersensing, ? AF episodes (which have been previously recorded) difficult to interpret ECGs available See PaceArt report Continue routine device clinic checks every 6 months

## 2012-05-28 ENCOUNTER — Inpatient Hospital Stay (HOSPITAL_COMMUNITY)
Admission: EM | Admit: 2012-05-28 | Discharge: 2012-05-30 | DRG: 378 | Disposition: A | Discharge status: Home / Self Care | Payer: Medicare Other | Attending: Internal Medicine | Admitting: Internal Medicine

## 2012-05-28 ENCOUNTER — Encounter (HOSPITAL_COMMUNITY): Payer: Self-pay | Admitting: *Deleted

## 2012-05-28 DIAGNOSIS — C649 Malignant neoplasm of unspecified kidney, except renal pelvis: Secondary | ICD-10-CM | POA: Insufficient documentation

## 2012-05-28 DIAGNOSIS — K5731 Diverticulosis of large intestine without perforation or abscess with bleeding: Secondary | ICD-10-CM | POA: Diagnosis present

## 2012-05-28 DIAGNOSIS — I4891 Unspecified atrial fibrillation: Secondary | ICD-10-CM | POA: Diagnosis not present

## 2012-05-28 DIAGNOSIS — Z7901 Long term (current) use of anticoagulants: Secondary | ICD-10-CM

## 2012-05-28 DIAGNOSIS — D62 Acute posthemorrhagic anemia: Secondary | ICD-10-CM | POA: Diagnosis not present

## 2012-05-28 DIAGNOSIS — R1032 Left lower quadrant pain: Secondary | ICD-10-CM | POA: Diagnosis not present

## 2012-05-28 DIAGNOSIS — K644 Residual hemorrhoidal skin tags: Secondary | ICD-10-CM | POA: Diagnosis present

## 2012-05-28 DIAGNOSIS — K219 Gastro-esophageal reflux disease without esophagitis: Secondary | ICD-10-CM | POA: Diagnosis present

## 2012-05-28 DIAGNOSIS — Z79899 Other long term (current) drug therapy: Secondary | ICD-10-CM

## 2012-05-28 DIAGNOSIS — Z905 Acquired absence of kidney: Secondary | ICD-10-CM | POA: Diagnosis not present

## 2012-05-28 DIAGNOSIS — K625 Hemorrhage of anus and rectum: Secondary | ICD-10-CM

## 2012-05-28 DIAGNOSIS — R55 Syncope and collapse: Secondary | ICD-10-CM

## 2012-05-28 DIAGNOSIS — I10 Essential (primary) hypertension: Secondary | ICD-10-CM | POA: Diagnosis present

## 2012-05-28 DIAGNOSIS — R109 Unspecified abdominal pain: Secondary | ICD-10-CM | POA: Diagnosis not present

## 2012-05-28 DIAGNOSIS — K5730 Diverticulosis of large intestine without perforation or abscess without bleeding: Secondary | ICD-10-CM | POA: Diagnosis not present

## 2012-05-28 DIAGNOSIS — K921 Melena: Secondary | ICD-10-CM | POA: Diagnosis not present

## 2012-05-28 DIAGNOSIS — M069 Rheumatoid arthritis, unspecified: Secondary | ICD-10-CM | POA: Diagnosis present

## 2012-05-28 LAB — CBC
HCT: 27.5 % — ABNORMAL LOW (ref 36.0–46.0)
Hemoglobin: 9 g/dL — ABNORMAL LOW (ref 12.0–15.0)
MCH: 30.6 pg (ref 26.0–34.0)
MCH: 30.4 pg (ref 26.0–34.0)
MCHC: 32.7 g/dL (ref 30.0–36.0)
MCHC: 32.8 g/dL (ref 30.0–36.0)
MCV: 93.5 fL (ref 78.0–100.0)
MCV: 92.8 fL (ref 78.0–100.0)
Platelets: 235 10*3/uL (ref 150–400)
Platelets: 233 10*3/uL (ref 150–400)
RBC: 2.94 MIL/uL — ABNORMAL LOW (ref 3.87–5.11)
RBC: 2.76 MIL/uL — ABNORMAL LOW (ref 3.87–5.11)
RDW: 14.3 % (ref 11.5–15.5)
RDW: 14.5 % (ref 11.5–15.5)
WBC: 11.6 10*3/uL — ABNORMAL HIGH (ref 4.0–10.5)

## 2012-05-28 LAB — URINALYSIS, ROUTINE W REFLEX MICROSCOPIC
Bilirubin Urine: NEGATIVE
Glucose, UA: NEGATIVE mg/dL
Ketones, ur: NEGATIVE mg/dL
Nitrite: NEGATIVE
Protein, ur: NEGATIVE mg/dL
Specific Gravity, Urine: 1.019 (ref 1.005–1.030)
Urobilinogen, UA: 0.2 mg/dL (ref 0.0–1.0)
pH: 5.5 (ref 5.0–8.0)

## 2012-05-28 LAB — URINE MICROSCOPIC-ADD ON

## 2012-05-28 LAB — COMPREHENSIVE METABOLIC PANEL
ALT: 11 U/L (ref 0–35)
AST: 28 U/L (ref 0–37)
Alkaline Phosphatase: 51 U/L (ref 39–117)
CO2: 25 mEq/L (ref 19–32)
Chloride: 104 mEq/L (ref 96–112)
GFR calc non Af Amer: 58 mL/min — ABNORMAL LOW (ref >90)
Sodium: 140 mEq/L (ref 135–145)
Total Bilirubin: 0.2 mg/dL — ABNORMAL LOW (ref 0.3–1.2)

## 2012-05-28 LAB — PROTIME-INR
INR: 4.24 — ABNORMAL HIGH (ref 0.00–1.49)
Prothrombin Time: 38.2 s — ABNORMAL HIGH (ref 11.6–15.2)

## 2012-05-28 LAB — OCCULT BLOOD, POC DEVICE: Fecal Occult Bld: POSITIVE — AB

## 2012-05-28 MED ORDER — HYDROCORTISONE 2.5 % RE CREA
TOPICAL_CREAM | Freq: Two times a day (BID) | RECTAL | Status: DC
  Administered 2012-05-28 – 2012-05-30 (×4): via RECTAL
  Filled 2012-05-28 (×2): qty 28.35

## 2012-05-28 MED ORDER — HYDROCHLOROTHIAZIDE 12.5 MG PO CAPS
12.5000 mg | ORAL_CAPSULE | Freq: Every day | ORAL | Status: DC
  Administered 2012-05-29 – 2012-05-30 (×2): 12.5 mg via ORAL
  Filled 2012-05-28 (×3): qty 1

## 2012-05-28 MED ORDER — METHYLPREDNISOLONE 4 MG PO TABS
4.0000 mg | ORAL_TABLET | Freq: Every day | ORAL | Status: DC
  Administered 2012-05-29 – 2012-05-30 (×2): 4 mg via ORAL
  Filled 2012-05-28 (×4): qty 1

## 2012-05-28 MED ORDER — MULTIVITAMINS PO CAPS: 1.0000 | ORAL_CAPSULE | Freq: Every day | ORAL | Status: DC

## 2012-05-28 MED ORDER — APRACLONIDINE HCL 0.5 % OP SOLN
1.0000 [drp] | Freq: Two times a day (BID) | OPHTHALMIC | Status: DC
  Administered 2012-05-28 – 2012-05-30 (×4): 1 [drp] via OPHTHALMIC
  Filled 2012-05-28: qty 5

## 2012-05-28 MED ORDER — HYDROCHLOROTHIAZIDE 25 MG PO TABS: 12.5000 mg | ORAL_TABLET | Freq: Every day | ORAL | Status: DC

## 2012-05-28 MED ORDER — POLYVINYL ALCOHOL 1.4 % OP SOLN
1.0000 [drp] | Freq: Two times a day (BID) | OPHTHALMIC | Status: DC
  Administered 2012-05-28 – 2012-05-30 (×4): 1 [drp] via OPHTHALMIC
  Filled 2012-05-28: qty 15

## 2012-05-28 MED ORDER — SODIUM CHLORIDE 0.9 % IJ SOLN
3.0000 mL | Freq: Two times a day (BID) | INTRAMUSCULAR | Status: DC
  Administered 2012-05-28 – 2012-05-30 (×2): 3 mL via INTRAVENOUS

## 2012-05-28 MED ORDER — CARBOXYMETHYLCELLULOSE SODIUM 0.5 % OP SOLN: 1.0000 [drp] | Freq: Two times a day (BID) | OPHTHALMIC | Status: DC

## 2012-05-28 MED ORDER — SODIUM CHLORIDE 0.9 % IV SOLN
INTRAVENOUS | Status: DC
  Administered 2012-05-28 – 2012-05-29 (×2): via INTRAVENOUS
  Administered 2012-05-29: 20 mL/h via INTRAVENOUS

## 2012-05-28 MED ORDER — ACETAMINOPHEN 325 MG PO TABS: 650.0000 mg | ORAL_TABLET | Freq: Four times a day (QID) | ORAL | Status: DC | PRN

## 2012-05-28 MED ORDER — TRAZODONE HCL 150 MG PO TABS
300.0000 mg | ORAL_TABLET | Freq: Every day | ORAL | Status: DC
  Administered 2012-05-28 – 2012-05-29 (×2): 300 mg via ORAL
  Filled 2012-05-28 (×3): qty 2

## 2012-05-28 MED ORDER — LATANOPROST 0.005 % OP SOLN
1.0000 [drp] | Freq: Every day | OPHTHALMIC | Status: DC
  Administered 2012-05-28 – 2012-05-29 (×2): 1 [drp] via OPHTHALMIC
  Filled 2012-05-28: qty 2.5

## 2012-05-28 MED ORDER — ACETAMINOPHEN 650 MG RE SUPP: 650.0000 mg | Freq: Four times a day (QID) | RECTAL | Status: DC | PRN

## 2012-05-28 MED ORDER — BROMFENAC SODIUM 0.09 % OP SOLN
1.0000 [drp] | Freq: Two times a day (BID) | OPHTHALMIC | Status: DC
  Filled 2012-05-28: qty 0.1

## 2012-05-28 MED ORDER — PHYTONADIONE 5 MG PO TABS
5.0000 mg | ORAL_TABLET | Freq: Once | ORAL | Status: AC
  Administered 2012-05-28: 5 mg via ORAL
  Filled 2012-05-28: qty 1

## 2012-05-28 MED ORDER — ADULT MULTIVITAMIN W/MINERALS CH
1.0000 | ORAL_TABLET | Freq: Every day | ORAL | Status: DC
  Administered 2012-05-28 – 2012-05-30 (×3): 1 via ORAL
  Filled 2012-05-28 (×3): qty 1

## 2012-05-28 MED ORDER — OMEGA-3-ACID ETHYL ESTERS 1 G PO CAPS
1.0000 g | ORAL_CAPSULE | Freq: Every day | ORAL | Status: DC
  Administered 2012-05-28 – 2012-05-30 (×3): 1 g via ORAL
  Filled 2012-05-28 (×3): qty 1

## 2012-05-28 MED ORDER — CYCLOSPORINE 0.05 % OP EMUL
1.0000 [drp] | Freq: Two times a day (BID) | OPHTHALMIC | Status: DC
  Administered 2012-05-28 – 2012-05-30 (×4): 1 [drp] via OPHTHALMIC
  Filled 2012-05-28 (×5): qty 1

## 2012-05-28 NOTE — ED Notes (Signed)
Pt reports rectal bleeding x2-3 weeks. Had colonoscopy last week and had polyp removed. Pt is on Coumadin. Bleeding has been worse since procedure. Heavy yesterday. Is intermittent, usually starts after a BM. C/o rectal and lower abd pain, nausea, dizziness.

## 2012-05-28 NOTE — ED Notes (Signed)
Attempted IV access x2, pt is a difficult stick. IV rn notified.

## 2012-05-28 NOTE — ED Provider Notes (Signed)
History     CSN: [id number]  Arrival date & time 05/28/12  1110   First MD Initiated Contact with Patient 05/28/12 1148      Chief Complaint  Patient presents with  . Rectal Bleeding    (Consider location/radiation/quality/duration/timing/severity/associated sxs/prior treatment) HPI Ashley [name] is an 79 y/o female who presents with rectal bleeding x 3 weeks. She had an MRI of her abdomen completed 2 weeks ago. 1 week ago she had a colonoscopy. A 5 mm polyp was removed. External hemorrhoids were noted during the colonoscopy. She has continued to have rectal bleeding with defecation. She states the pain is in her LLQ and suprapubic region. She reports some constipation and is taking metamucil. Yesterday she reports having 4 BMs and one of which had bright red blood that filled the toilet bowl. This morning she had a BM with no blood. She hasn't ever looked at the bath tissue to note blood after defecation. She takes Coumadin and restarted the day after her colonoscopy. The coumadin had been held for 1 week prior to the colonoscopy. She denies fevers, chills, cough, hematemesis, and melena.  Past Medical History  Diagnosis Date  . Syncope   . HTN (hypertension)   . Uterine prolapse   . Cryptococcal pneumonitis     right lower lobe  . GERD (gastroesophageal reflux disease)   . Rheumatoid arthritis   . Choroid melanoma of left eye   . Renal cell carcinoma     Left kidney    Past Surgical History  Procedure Laterality Date  . Nephrectomy      native  . Eye surgery    . Bladder tact    . Partial removal of lung    . Tumor removed from left eye    . Cholecystectomy    . Brain meningioma excision    . Implanted loop recorder      Left Breast  . Colonoscopy N/A 05/21/2012    Procedure: COLONOSCOPY;  Surgeon: Willis Modena, MD;  Location: [facility] ENDOSCOPY;  Service: Endoscopy;  Laterality: N/A;    Family History  Problem Relation Age of Onset  . Heart disease Maternal Aunt      History  Substance Use Topics  . Smoking status: Never Smoker   . Smokeless tobacco: Never Used  . Alcohol Use: No    OB History   Grav Para Term Preterm Abortions TAB SAB Ect Mult Living                  Review of Systems All other systems negative except as documented in the HPI. All pertinent positives and negatives as reviewed in the HPI.  Allergies  Review of patient's allergies indicates no known allergies.  Home Medications   Current Outpatient Rx  Name  Route  Sig  Dispense  Refill  . apraclonidine (IOPIDINE) 0.5 % ophthalmic solution   Both Eyes   Place 1 drop into both eyes 2 (two) times daily.         . bromfenac (XIBROM) 0.09 % ophthalmic solution   Both Eyes   Place 1 drop into both eyes 2 (two) times daily.          . carboxymethylcellulose (REFRESH TEARS) 0.5 % SOLN   Both Eyes   Place 1 drop into both eyes 2 (two) times daily.         . cycloSPORINE (RESTASIS) 0.05 % ophthalmic emulsion   Both Eyes   Place 1 drop into both eyes 2 (two)  times daily.           . hydrochlorothiazide (HYDRODIURIL) 12.5 MG tablet   Oral   Take 12.5 mg by mouth daily.           Marland Kitchen latanoprost (XALATAN) 0.005 % ophthalmic solution   Both Eyes   Place 1 drop into both eyes at bedtime.         . methylPREDNISolone (MEDROL) 4 MG tablet   Oral   Take 4 mg by mouth daily.          . Multiple Vitamin (MULTIVITAMIN) capsule   Oral   Take 1 capsule by mouth daily.           Marland Kitchen omega-3 acid ethyl esters (LOVAZA) 1 G capsule   Oral   Take 1 g by mouth daily. Taking 1 1/2 tab daily         . trazodone (DESYREL) 300 MG tablet   Oral   Take 300 mg by mouth at bedtime.           Marland Kitchen warfarin (COUMADIN) 5 MG tablet   Oral   Take 1 tablet (5 mg total) by mouth daily. Use as directed by the Anticoagulation Clinic   1 tablet   0     BP 96/53  Pulse 90  Temp(Src) 98.7 F (37.1 C) (Oral)  Resp 18  SpO2 92%  Physical Exam  Constitutional: She is  oriented to person, place, and time. She appears well-developed and well-nourished.  HENT:  Head: Normocephalic and atraumatic.  Mouth/Throat: Oropharynx is clear and moist.  Eyes: Pupils are equal, round, and reactive to light.  Neck: Normal range of motion. Neck supple.  Cardiovascular: Normal rate, regular rhythm and normal heart sounds.  Exam reveals no gallop and no friction rub.   No murmur heard. Pulmonary/Chest: Effort normal and breath sounds normal. No respiratory distress.  Abdominal: Soft. Bowel sounds are normal. She exhibits no distension and no mass. There is no guarding.    Genitourinary: Guaiac positive stool.     Neurological: She is alert and oriented to person, place, and time.  Skin: Skin is warm.    ED Course  Procedures (including critical care time)  Labs Reviewed  PROTIME-INR - Abnormal; Notable for the following:    Prothrombin Time 38.2 (*)    INR 4.24 (*)    All other components within normal limits  CBC - Abnormal; Notable for the following:    WBC 11.6 (*)    RBC 2.94 (*)    Hemoglobin 9.0 (*)    HCT 27.5 (*)    All other components within normal limits  COMPREHENSIVE METABOLIC PANEL - Abnormal; Notable for the following:    Albumin 2.7 (*)    Total Bilirubin 0.2 (*)    GFR calc non Af Amer 58 (*)    GFR calc Af Amer 68 (*)    All other components within normal limits  URINALYSIS, ROUTINE W REFLEX MICROSCOPIC - Abnormal; Notable for the following:    Hgb urine dipstick MODERATE (*)    Leukocytes, UA TRACE (*)    All other components within normal limits  URINE MICROSCOPIC-ADD ON - Abnormal; Notable for the following:    Squamous Epithelial / LPF FEW (*)    Bacteria, UA FEW (*)    All other components within normal limits  OCCULT BLOOD, POC DEVICE - Abnormal; Notable for the following:    Fecal Occult Bld POSITIVE (*)    All other components within  normal limits  TYPE AND SCREEN  ABO/RH   Patient will be admitted to the hospital for  observation. Patient in stable here in the emergency department.   MDM  MDM Reviewed: vitals and nursing note Interpretation: labs Consults: admitting MD and gastrointestinal            Carlyle Dolly, PA-C 05/28/12 1502

## 2012-05-28 NOTE — ED Provider Notes (Signed)
79 year old female comes in with ongoing rectal bleeding. She had a colonoscopy last week because of rectal bleeding an she was noted to have significant diverticulosis the bleeding was felt to be coming from internal hemorrhoids. She is anticoagulated on warfarin. Today, INR is supratherapeutic at 4.24 which is not having gross bleeding. Hemoglobin has dropped to 9.0 but last hemoglobin A on our records was within 2 years ago. Will attempt to get records from her gastroenterologist office to see what her more recent hemoglobin was but she will need to be admitted for serial hemoglobins and while INR is corrected.   Medical screening examination/treatment/procedure(s) were performed by non-physician practitioner and as supervising physician I was immediately available for consultation/collaboration.   Dione Booze, MD 05/28/12 1350

## 2012-05-28 NOTE — ED Notes (Signed)
Called to give report, rn just received pt, will call back in .

## 2012-05-28 NOTE — Progress Notes (Signed)
Offered support to patient, spouse, and another family member. Listening; presence.

## 2012-05-28 NOTE — H&P (Addendum)
[facility] History and Physical  [name] AVW:[id number] DOB: 08/17/1928 DOA: 05/28/2012  Referring physician: ED PCP: Ailene Ravel, MD   Chief Complaint:  rectal bleeding for 2 days  HPI:  79 y/o female with hx of HTN, GERD, RA, right renal cell CA status post nephrectomy, hx of unexplained syncope with internal loop recorder in place,  afib on coumadin who recently had a colonoscopy by [facility] ( Dr Dulce Sellar) for  Blood in stool and abnormal rectosigmoid thickening seen on recent CT done  for abdominal pain. Scattered diverticula was seen without any bleeding, internal hemorrhoids and a poly which was removed showing tubular adenoma. Patient was resumed back on Coumadin after the procedure. She returned to the ED today after having several episode of bright red blood per rectum for past 2 days. She felt it headed but denies any syncope, chest pain, palpitations, shortness of breath, hematemesis, fever, chills, diarrhea or urinary symptoms. She has off and on lower abdominal pain which has been ongoing for past several weeks. Patient follows with Coumadin clinic for INR monitoring and has not had it checked she had restarted it one week back. Denies any change in her medications. Denies taking over-the-counter NSAIDs.  Course in the ED Vitals were stable. Physical exam was unremarkable. A rectal exam done in the ED showed some bright red blood. Stool for occult blood was positive. Blood work showed hemoglobin of 9. No previous hemoglobin in the system for past 2 years. On contacting her PCP the last hemoglobin available to her was 12.7 that was done 3 weeks back prior to colonoscopy. Patient noted to have a supratherapeutic INR of 4.2. [facility] consulted her admission.   Review of Systems: (Positive symptoms in bold) Constitutional: Denies fever, chills, diaphoresis, appetite change and fatigue.  HEENT: Denies photophobia, eye pain, redness, hearing loss, ear pain,  congestion, sore throat, rhinorrhea, sneezing, mouth sores, trouble swallowing, neck pain, neck stiffness and tinnitus.   Respiratory: Denies SOB, DOE, cough, chest tightness,  and wheezing.   Cardiovascular: Denies chest pain, palpitations and leg swelling.  Gastrointestinal: Has lower abdominal pain. Bright red blood in stool Denies nausea, vomiting, diarrhea, constipation and abdominal distention.  Genitourinary: Denies dysuria, urgency, frequency, hematuria, flank pain and difficulty urinating.  Musculoskeletal: Denies myalgias, back pain, joint swelling, arthralgias and gait problem.  Skin: Denies pallor, rash and wound.  Neurological:  dizziness, Denies seizures, syncope, weakness, , numbness and headaches.  Hematological: Denies adenopathy. Easy bruising, personal or family bleeding history  Psychiatric/Behavioral: Denies suicidal ideation, mood changes, confusion, nervousness, sleep disturbance and agitation   Past Medical History  Diagnosis Date  . Syncope   . HTN (hypertension)   . Uterine prolapse   . Cryptococcal pneumonitis     right lower lobe  . GERD (gastroesophageal reflux disease)   . Rheumatoid arthritis   . Choroid melanoma of left eye   . Renal cell carcinoma     Left kidney   Past Surgical History  Procedure Laterality Date  . Nephrectomy      native  . Eye surgery    . Bladder tact    . Partial removal of lung    . Tumor removed from left eye    . Cholecystectomy    . Brain meningioma excision    . Implanted loop recorder      Left Breast  . Colonoscopy N/A 05/21/2012    Procedure: COLONOSCOPY;  Surgeon: Willis Modena, MD;  Location: [facility];  Service: Endoscopy;  Laterality: N/A;   Social History:  reports that she has never smoked. She has never used smokeless tobacco. She reports that she does not drink alcohol or use illicit drugs.  No Known Allergies  Family History  Problem Relation Age of Onset  . Heart disease Maternal Aunt      Prior to Admission medications   Medication Sig Start Date End Date Taking? Authorizing Provider  apraclonidine (IOPIDINE) 0.5 % ophthalmic solution Place 1 drop into both eyes 2 (two) times daily.   Yes Historical Provider, MD  bromfenac (XIBROM) 0.09 % ophthalmic solution Place 1 drop into both eyes 2 (two) times daily.    Yes Historical Provider, MD  carboxymethylcellulose (REFRESH TEARS) 0.5 % SOLN Place 1 drop into both eyes 2 (two) times daily.   Yes Historical Provider, MD  cycloSPORINE (RESTASIS) 0.05 % ophthalmic emulsion Place 1 drop into both eyes 2 (two) times daily.     Yes Historical Provider, MD  hydrochlorothiazide (HYDRODIURIL) 12.5 MG tablet Take 12.5 mg by mouth daily.     Yes Historical Provider, MD  latanoprost (XALATAN) 0.005 % ophthalmic solution Place 1 drop into both eyes at bedtime.   Yes Historical Provider, MD  methylPREDNISolone (MEDROL) 4 MG tablet Take 4 mg by mouth daily.    Yes Historical Provider, MD  Multiple Vitamin (MULTIVITAMIN) capsule Take 1 capsule by mouth daily.     Yes Historical Provider, MD  omega-3 acid ethyl esters (LOVAZA) 1 G capsule Take 1 g by mouth daily. Taking 1 1/2 tab daily   Yes Historical Provider, MD  trazodone (DESYREL) 300 MG tablet Take 300 mg by mouth at bedtime.     Yes Historical Provider, MD  warfarin (COUMADIN) 5 MG tablet Take 1 tablet (5 mg total) by mouth daily. Use as directed by the Anticoagulation Clinic 05/22/12  Yes Willis Modena, MD    Physical Exam:  Filed Vitals:   05/28/12 1308 05/28/12 1325 05/28/12 1345 05/28/12 1623  BP: 116/76 127/78 96/53 125/64  Pulse: 70 90  69  Temp:    98 F (36.7 C)  TempSrc:      Resp:   18 18  SpO2:   92% 99%    Constitutional: Vital signs reviewed.  Patient is a well-developed and well-nourished in no acute distress and cooperative with exam. Alert and oriented x3.  Head: Normocephalic and atraumatic Ear: TM normal bilaterally Mouth: no erythema or exudates, MMM Eyes:  PERRL, EOMI, conjunctivae normal, No scleral icterus.  Neck: Supple, Trachea midline normal ROM, No JVD, mass, thyromegaly, or carotid bruit present.  Cardiovascular: RRR, S1 normal, S2 normal, no MRG, pulses symmetric and intact bilaterally Pulmonary/Chest: CTAB, no wheezes, rales, or rhonchi Abdominal: Soft. Non-tender, non-distended, bowel sounds are normal, no masses, organomegaly, or guarding present. Rectal exam with internal hemorrhoids at 10 o'clock position with bright red blood in the gloves. GU: no CVA tenderness Musculoskeletal: No joint deformities, erythema, or stiffness, ROM full and no nontender Ext: no edema and no cyanosis, pulses palpable bilaterally (DP and PT) Hematology: no cervical, inginal, or axillary adenopathy.  Neurological: A&O x3, Strenght is normal and symmetric bilaterally, cranial nerve II-XII are grossly intact, no focal motor deficit, sensory intact to light touch bilaterally.  Skin: Warm, dry and intact. No rash, cyanosis, or clubbing.  Psychiatric: Normal mood and affect. speech and behavior is normal. Judgment and thought content normal. Cognition and memory are normal.   Labs on Admission:  Basic Metabolic Panel:  Recent Labs Lab 05/28/12 1225  NA 140  K 3.6  CL 104  CO2 25  GLUCOSE 95  BUN 13  CREATININE 0.90  CALCIUM 9.2   Liver Function Tests:  Recent Labs Lab 05/28/12 1225  AST 28  ALT 11  ALKPHOS 51  BILITOT 0.2*  PROT 6.1  ALBUMIN 2.7*   No results found for this basename: LIPASE, AMYLASE,  in the last 168 hours No results found for this basename: AMMONIA,  in the last 168 hours CBC:  Recent Labs Lab 05/28/12 1225  WBC 11.6*  HGB 9.0*  HCT 27.5*  MCV 93.5  PLT 235   Cardiac Enzymes: No results found for this basename: CKTOTAL, CKMB, CKMBINDEX, TROPONINI,  in the last 168 hours BNP: No components found with this basename: POCBNP,  CBG: No results found for this basename: GLUCAP,  in the last 168  hours  Radiological Exams on Admission: No results found.  QIO:[id number]  Assessment/Plan Principal Problem:   Rectal bleeding Likely in the setting of internal hemorrhoids versus diverticular bleed. Admit to observation on telemetry. Patent screen ordered. Monitor serial H&H every 8 hours. -Given supratherapeutic INR will hold Coumadin and order 5 mg oral vitamin K. Monitor INR in a.m. -I will order for anusol cream. -I have spoken with Dr. Madilyn Fireman with [facility] who will evaluate this in the morning.  Active Problems:   Hypertension Blood pressure stable. Resume home HCTZ    Atrial fibrillation In sinus rhythm. On Coumadin as outpatient with supratherapeutic INR which is being held. -Follows with Dr. Ladona Ridgel [facility] cardiology)    GERD (gastroesophageal reflux disease) Continue PPI    Rheumatoid arthritis On low dose Solu-Medrol which will continue      Diet: Clear liquids  Code Status: full code Family Communication: none at bedside Disposition Plan: home once stable  Eddie North [facility] Pager [phone]  If 7PM-7AM, please contact night-coverage [email or web address] Password [facility] 05/28/2012, 5:41 PM     Total time spent: 70 minutes

## 2012-05-29 DIAGNOSIS — K5730 Diverticulosis of large intestine without perforation or abscess without bleeding: Secondary | ICD-10-CM | POA: Diagnosis not present

## 2012-05-29 DIAGNOSIS — D62 Acute posthemorrhagic anemia: Secondary | ICD-10-CM

## 2012-05-29 DIAGNOSIS — K625 Hemorrhage of anus and rectum: Secondary | ICD-10-CM

## 2012-05-29 DIAGNOSIS — I4891 Unspecified atrial fibrillation: Secondary | ICD-10-CM

## 2012-05-29 DIAGNOSIS — I10 Essential (primary) hypertension: Secondary | ICD-10-CM

## 2012-05-29 LAB — CBC
HCT: 32.2 % — ABNORMAL LOW (ref 36.0–46.0)
Hemoglobin: 9.5 g/dL — ABNORMAL LOW (ref 12.0–15.0)
MCH: 30.8 pg (ref 26.0–34.0)
MCHC: 33.2 g/dL (ref 30.0–36.0)
MCHC: 32.9 g/dL (ref 30.0–36.0)
MCV: 92.5 fL (ref 78.0–100.0)
Platelets: 200 10*3/uL (ref 150–400)
Platelets: 227 10*3/uL (ref 150–400)
RBC: 3.08 MIL/uL — ABNORMAL LOW (ref 3.87–5.11)
RDW: 14.4 % (ref 11.5–15.5)
RDW: 14.8 % (ref 11.5–15.5)
WBC: 8.1 10*3/uL (ref 4.0–10.5)
WBC: 9.3 10*3/uL (ref 4.0–10.5)

## 2012-05-29 LAB — PROTIME-INR
INR: 1.66 — ABNORMAL HIGH (ref 0.00–1.49)
Prothrombin Time: 19.1 seconds — ABNORMAL HIGH (ref 11.6–15.2)

## 2012-05-29 MED ORDER — POLYETHYLENE GLYCOL 3350 17 G PO PACK
17.0000 g | PACK | Freq: Three times a day (TID) | ORAL | Status: DC
  Administered 2012-05-29: 17 g via ORAL
  Filled 2012-05-29 (×6): qty 1

## 2012-05-29 MED ORDER — FUROSEMIDE 10 MG/ML IJ SOLN
20.0000 mg | Freq: Once | INTRAMUSCULAR | Status: AC
  Administered 2012-05-29: 20 mg via INTRAVENOUS
  Filled 2012-05-29: qty 2

## 2012-05-29 NOTE — Consult Note (Signed)
[facility] CONSULT Reason for consult: G.I. bleeding Referring Physician: [facility]. PCP: Dr. Nathanial Rancher. Primary G.I.: Dr. Corky Downs is an 79 y.o. female.  HPI: 79 year old woman who has a history of prior nephrectomy for renal cell cancer and who is on Coumadin for atrial fibrillation. Have colonoscopy 1 week ago by Dr. Dulce Sellar for G.I. bleeding. Very small sessile polyp removed. Patient had extensive diverticular disease and multiple maneuvers were required to pass the sigmoid colon. She also had significant hemorrhoids. She held her Coumadin for several days and then restarted it. She came back to the emergency room after several episodes of bright red blood per rectum" feeling weak quote. Hemoglobin was 9.0 and has declined to 7.8. The patient has had vague left lower quadrant pain for several months and has been chronically constipated. She has not had a good bowel movement since her colonoscopy 1 week ago despite taking OTC fiber supplements. We're asked to see her again about her lower G.I. bleeding.   Past Medical History  Diagnosis Date  . Syncope   . HTN (hypertension)   . Uterine prolapse   . Cryptococcal pneumonitis     right lower lobe  . GERD (gastroesophageal reflux disease)   . Rheumatoid arthritis   . Choroid melanoma of left eye   . Renal cell carcinoma     Left kidney    Past Surgical History  Procedure Laterality Date  . Nephrectomy      native  . Eye surgery    . Bladder tact    . Partial removal of lung    . Tumor removed from left eye    . Cholecystectomy    . Brain meningioma excision    . Implanted loop recorder      Left Breast  . Colonoscopy N/A 05/21/2012    Procedure: COLONOSCOPY;  Surgeon: Willis Modena, MD;  Location: [facility];  Service: Endoscopy;  Laterality: N/A;    Family History  Problem Relation Age of Onset  . Heart disease Maternal Aunt     Social History:  reports that she has never smoked. She has never  used smokeless tobacco. She reports that she does not drink alcohol or use illicit drugs.  Allergies: No Known Allergies  Medications; . apraclonidine  1 drop Both Eyes BID  . bromfenac  1 drop Both Eyes BID  . cycloSPORINE  1 drop Both Eyes BID  . furosemide  20 mg Intravenous Once  . hydrochlorothiazide  12.5 mg Oral Daily  . hydrocortisone   Rectal BID  . latanoprost  1 drop Both Eyes QHS  . methylPREDNISolone  4 mg Oral Q breakfast  . multivitamin with minerals  1 tablet Oral Daily  . omega-3 acid ethyl esters  1 g Oral Daily  . polyvinyl alcohol  1 drop Both Eyes BID  . sodium chloride  3 mL Intravenous Q12H  . trazodone  300 mg Oral QHS   PRN Meds acetaminophen, acetaminophen Results for orders placed during the hospital encounter of 05/28/12 (from the past 48 hour(s))  PROTIME-INR     Status: Abnormal   Collection Time    05/28/12 12:25 PM      Result Value Range   Prothrombin Time 38.2 (*) 11.6 - 15.2 seconds   INR 4.24 (*) 0.00 - 1.49  CBC     Status: Abnormal   Collection Time    05/28/12 12:25 PM      Result Value Range   WBC 11.6 (*)  4.0 - 10.5 K/uL   RBC 2.94 (*) 3.87 - 5.11 MIL/uL   Hemoglobin 9.0 (*) 12.0 - 15.0 g/dL   HCT 13.0 (*) 86.5 - 78.4 %   MCV 93.5  78.0 - 100.0 fL   MCH 30.6  26.0 - 34.0 pg   MCHC 32.7  30.0 - 36.0 g/dL   RDW 69.6  29.5 - 28.4 %   Platelets 235  150 - 400 K/uL  COMPREHENSIVE METABOLIC PANEL     Status: Abnormal   Collection Time    05/28/12 12:25 PM      Result Value Range   Sodium 140  135 - 145 mEq/L   Potassium 3.6  3.5 - 5.1 mEq/L   Chloride 104  96 - 112 mEq/L   CO2 25  19 - 32 mEq/L   Glucose, Bld 95  70 - 99 mg/dL   BUN 13  6 - 23 mg/dL   Creatinine, Ser 1.32  0.50 - 1.10 mg/dL   Calcium 9.2  8.4 - 44.0 mg/dL   Total Protein 6.1  6.0 - 8.3 g/dL   Albumin 2.7 (*) 3.5 - 5.2 g/dL   AST 28  0 - 37 U/L   ALT 11  0 - 35 U/L   Alkaline Phosphatase 51  39 - 117 U/L   Total Bilirubin 0.2 (*) 0.3 - 1.2 mg/dL   GFR calc  non Af Amer 58 (*) >90 mL/min   GFR calc Af Amer 68 (*) >90 mL/min   Comment:            The eGFR has been calculated     using the CKD EPI equation.     This calculation has not been     validated in all clinical     situations.     eGFR's persistently     <90 mL/min signify     possible Chronic Kidney Disease.  TYPE AND SCREEN     Status: None   Collection Time    05/28/12 12:25 PM      Result Value Range   ABO/RH(D) O POS     Antibody Screen NEG     Sample Expiration 05/31/2012     Unit Number [id number]     Blood Component Type RED CELLS,LR     Unit division 00     Status of Unit ALLOCATED     Transfusion Status OK TO TRANSFUSE     Crossmatch Result Compatible    ABO/RH     Status: None   Collection Time    05/28/12 12:25 PM      Result Value Range   ABO/RH(D) O POS    URINALYSIS, ROUTINE W REFLEX MICROSCOPIC     Status: Abnormal   Collection Time    05/28/12  1:22 PM      Result Value Range   Color, Urine YELLOW  YELLOW   APPearance CLEAR  CLEAR   Specific Gravity, Urine 1.019  1.005 - 1.030   pH 5.5  5.0 - 8.0   Glucose, UA NEGATIVE  NEGATIVE mg/dL   Hgb urine dipstick MODERATE (*) NEGATIVE   Bilirubin Urine NEGATIVE  NEGATIVE   Ketones, ur NEGATIVE  NEGATIVE mg/dL   Protein, ur NEGATIVE  NEGATIVE mg/dL   Urobilinogen, UA 0.2  0.0 - 1.0 mg/dL   Nitrite NEGATIVE  NEGATIVE   Leukocytes, UA TRACE (*) NEGATIVE  OCCULT BLOOD, POC DEVICE     Status: Abnormal   Collection Time    05/28/12  1:22 PM      Result Value Range   Fecal Occult Bld POSITIVE (*) NEGATIVE  URINE MICROSCOPIC-ADD ON     Status: Abnormal   Collection Time    05/28/12  1:22 PM      Result Value Range   Squamous Epithelial / LPF FEW (*) RARE   WBC, UA 0-2  <3 WBC/hpf   RBC / HPF 0-2  <3 RBC/hpf   Bacteria, UA FEW (*) RARE  CBC     Status: Abnormal   Collection Time    05/28/12  8:07 PM      Result Value Range   WBC 11.3 (*) 4.0 - 10.5 K/uL   RBC 2.76 (*) 3.87 - 5.11 MIL/uL    Hemoglobin 8.4 (*) 12.0 - 15.0 g/dL   HCT 28.4 (*) 13.2 - 44.0 %   MCV 92.8  78.0 - 100.0 fL   MCH 30.4  26.0 - 34.0 pg   MCHC 32.8  30.0 - 36.0 g/dL   RDW 10.2  72.5 - 36.6 %   Platelets 233  150 - 400 K/uL  CBC     Status: Abnormal   Collection Time    05/29/12  5:23 AM      Result Value Range   WBC 8.1  4.0 - 10.5 K/uL   RBC 2.52 (*) 3.87 - 5.11 MIL/uL   Hemoglobin 7.8 (*) 12.0 - 15.0 g/dL   HCT 44.0 (*) 34.7 - 42.5 %   MCV 93.3  78.0 - 100.0 fL   MCH 31.0  26.0 - 34.0 pg   MCHC 33.2  30.0 - 36.0 g/dL   RDW 95.6  38.7 - 56.4 %   Platelets 200  150 - 400 K/uL  PROTIME-INR     Status: Abnormal   Collection Time    05/29/12  5:23 AM      Result Value Range   Prothrombin Time 19.1 (*) 11.6 - 15.2 seconds   INR 1.66 (*) 0.00 - 1.49  PREPARE RBC (CROSSMATCH)     Status: None   Collection Time    05/29/12  9:00 AM      Result Value Range   Order Confirmation ORDER PROCESSED BY BLOOD BANK      No results found.             Blood pressure 112/59, pulse 62, temperature 98 F (36.7 C), temperature source Oral, resp. rate 18, height 5\' 6"  (1.676 m), weight 63.095 kg (139 lb 1.6 oz), SpO2 98.00%.  Physical exam:   Gen. --  African-American female note to distress   Lungs -- clear CV-- no murmurs are gallops Abdomen -- none distended with good bowel sounds. Slight left lower quadrant tenderness.  Assessment: 1. Lower G.I. bleeding. Doubt this is due to polypectomy this far out with such a small polyp being removed. Patient does have severe diverticular disease and the sigmoid colon and more than likely this is a diverticular bleed exacerbated by her constipation and Coumadin. Don't feel this is likely to be ischemic colitis. 2.  Chronic constipation 3. Severe diverticulosis   Plan: would continue to support her. We will go ahead and add Miralax daily to soften her stools. Don't feel that she needs another colonoscopy at this time.   EDWARDS JR,JAMES L 05/29/2012,  10:32 AM

## 2012-05-29 NOTE — Care Management Note (Addendum)
    Page 1 of 1   05/30/2012     1:21:59 PM   CARE MANAGEMENT NOTE 05/30/2012  Patient:  BLESSINGS, INGLETT   Account Number:  [id number]  Date Initiated:  05/29/2012  Documentation initiated by:  Lanier Clam  Subjective/Objective Assessment:   ADMITTED W/RECTAL BLEED.     Action/Plan:   FROM HOME W/SPOUSE.HAS PCP,PHARMACY.   Anticipated DC Date:  05/30/2012   Anticipated DC Plan:  HOME/SELF CARE      DC Planning Services  CM consult      Choice offered to / List presented to:             Status of service:  Completed, signed off Medicare Important Message given?   (If response is "NO", the following Medicare IM given date fields will be blank) Date Medicare IM given:   Date Additional Medicare IM given:    Discharge Disposition:  HOME/SELF CARE  Per UR Regulation:  Reviewed for med. necessity/level of care/duration of stay  If discussed at Long Length of Stay Meetings, dates discussed:    Comments:  05/29/12 Dr [facility] RN,BSN NCM [phone]

## 2012-05-29 NOTE — Progress Notes (Signed)
Nutrition Brief Note  Patient identified on the Malnutrition Screening Tool (MST) Report  Body mass index is 22.46 kg/(m^2). Patient meets criteria for normal weight based on current BMI. Pt reports that her usual body weight is 137 lbs. Pt was weighed at 139 lbs 05/28/12.   Current diet order is Full Liquid, patient is consuming approximately 100% of meals at this time. Pt states she is tolerating diet well. Pt states her appetite is fair now and that she was eating well PTA. Pt has no questions or concerns at this time. Labs and medications reviewed.   No nutrition interventions warranted at this time. If nutrition issues arise, please consult RD.   Ian Malkin RD, LDN Inpatient Clinical Dietitian Pager: [phone] After Hours Pager: [phone]

## 2012-05-29 NOTE — Progress Notes (Addendum)
[facility] PROGRESS NOTE  Ashley [name] EXB:[id number] DOB: [birth year] DOA: 05/28/2012 PCP: Ailene Ravel, MD  Assessment/Plan:  Lower GI Bleed:  - Diverticular vs hemorroidal  - hemodynamically stable, hopefully self limiting, appears to be resolving - [facility] following - transfuse 1 unit PRBC today - hold coumadin, restart once resolved  Acute Blood loss anemia: stable, transfuse 1 unit PRBC today, CBC Q8  Hypertension  Blood pressure stable, continue HCTZ   Atrial fibrillation  - On Coumadin as outpatient, INR supra therapeutic on admission, currently on hold.  -Follows with Dr. Ladona Ridgel [facility] cardiology)   GERD (gastroesophageal reflux disease)  Continue PPI   Rheumatoid arthritis  Continue methylprednisolone    Code Status: full code  Family Communication: none at bedside  Disposition Plan: home once stable      Consultants:  [facility]   HPI/Subjective: No BMs /bleeding since yesterday  Objective: Filed Vitals:   05/29/12 0400 05/29/12 1030 05/29/12 1100 05/29/12 1200  BP: 112/59 129/78 117/62 134/71  Pulse: 62 79 67 66  Temp: 98 F (36.7 C) 97.5 F (36.4 C) 98.1 F (36.7 C) 98.2 F (36.8 C)  TempSrc: Oral Oral Oral Oral  Resp: 18 18 18 18   Height:      Weight:      SpO2: 98%       Intake/Output Summary (Last 24 hours) at 05/29/12 1235 Last data filed at 05/29/12 1200  Gross per 24 hour  Intake 1812.5 ml  Output    701 ml  Net 1111.5 ml   Filed Weights   05/28/12 1754  Weight: 63.095 kg (139 lb 1.6 oz)    Exam:   General:  AAOx3, no distress  Cardiovascular: S1S2/RRR  Respiratory: CTAB  Abdomen: soft, NT, BS present  Ext: no edema c/c  Data Reviewed: Basic Metabolic Panel:  Recent Labs Lab 05/28/12 1225  NA 140  K 3.6  CL 104  CO2 25  GLUCOSE 95  BUN 13  CREATININE 0.90  CALCIUM 9.2   Liver Function Tests:  Recent Labs Lab 05/28/12 1225  AST 28  ALT 11  ALKPHOS 51  BILITOT 0.2*  PROT 6.1   ALBUMIN 2.7*   No results found for this basename: LIPASE, AMYLASE,  in the last 168 hours No results found for this basename: AMMONIA,  in the last 168 hours CBC:  Recent Labs Lab 05/28/12 1225 05/28/12 2007 05/29/12 0523  WBC 11.6* 11.3* 8.1  HGB 9.0* 8.4* 7.8*  HCT 27.5* 25.6* 23.5*  MCV 93.5 92.8 93.3  PLT 235 233 200   Cardiac Enzymes: No results found for this basename: CKTOTAL, CKMB, CKMBINDEX, TROPONINI,  in the last 168 hours BNP (last 3 results) No results found for this basename: PROBNP,  in the last 8760 hours CBG: No results found for this basename: GLUCAP,  in the last 168 hours  No results found for this or any previous visit (from the past 240 hour(s)).   Studies: No results found.  Scheduled Meds: . apraclonidine  1 drop Both Eyes BID  . bromfenac  1 drop Both Eyes BID  . cycloSPORINE  1 drop Both Eyes BID  . furosemide  20 mg Intravenous Once  . hydrochlorothiazide  12.5 mg Oral Daily  . hydrocortisone   Rectal BID  . latanoprost  1 drop Both Eyes QHS  . methylPREDNISolone  4 mg Oral Q breakfast  . multivitamin with minerals  1 tablet Oral Daily  . omega-3 acid ethyl esters  1 g  Oral Daily  . polyethylene glycol  17 g Oral TID  . polyvinyl alcohol  1 drop Both Eyes BID  . sodium chloride  3 mL Intravenous Q12H  . trazodone  300 mg Oral QHS   Continuous Infusions: . sodium chloride 20 mL/hr (05/29/12 0935)    Principal Problem:   Rectal bleeding Active Problems:   Hypertension   Atrial fibrillation   GERD (gastroesophageal reflux disease)   Rheumatoid arthritis   Acute blood loss anemia    Time spent:    [facility]  [facility] Pager [phone]. If 7PM-7AM, please contact night-coverage at [email or web address], password [facility] 05/29/2012, 12:35 PM  LOS: 1 day

## 2012-05-30 DIAGNOSIS — K625 Hemorrhage of anus and rectum: Secondary | ICD-10-CM

## 2012-05-30 DIAGNOSIS — D62 Acute posthemorrhagic anemia: Secondary | ICD-10-CM

## 2012-05-30 DIAGNOSIS — K5730 Diverticulosis of large intestine without perforation or abscess without bleeding: Secondary | ICD-10-CM | POA: Diagnosis not present

## 2012-05-30 LAB — CBC
HCT: 29.8 % — ABNORMAL LOW (ref 36.0–46.0)
MCHC: 33.6 g/dL (ref 30.0–36.0)
MCV: 92.5 fL (ref 78.0–100.0)
RDW: 14.9 % (ref 11.5–15.5)

## 2012-05-30 LAB — TYPE AND SCREEN: Unit division: 0

## 2012-05-30 MED ORDER — HYDROCORTISONE 2.5 % RE CREA: TOPICAL_CREAM | Freq: Two times a day (BID) | RECTAL | Status: DC | PRN

## 2012-05-30 NOTE — Progress Notes (Signed)
[facility] PROGRESS NOTE Subjective No further bleeding or pain eating breakfast  Objective: Vital signs in last 24 hours: Temp:  [97.5 F (36.4 C)-98.6 F (37 C)] 98.1 F (36.7 C) (03/14 0453) Pulse Rate:  [57-79] 58 (03/14 0453) Resp:  [18] 18 (03/14 0453) BP: (93-134)/(48-78) 93/57 mmHg (03/14 0453) SpO2:  [97 %-100 %] 97 % (03/14 0453) Last BM Date: 05/29/12  Intake/Output from previous day: 03/13 0701 - 03/14 0700 In: 1405 [P.O.:960; I.V.:120; Blood:325] Out: 2000 [Urine:2000] Intake/Output this shift:    PE: Abd--soft nontender  Lab Results:  Recent Labs  05/28/12 2007 05/29/12 0523 05/29/12 1524 05/29/12 1953 05/30/12 0430  WBC 11.3* 8.1 8.3 9.3 7.7  HGB 8.4* 7.8* 9.5* 10.6* 10.0*  HCT 25.6* 23.5* 28.5* 32.2* 29.8*  PLT 233 200 211 227 218   BMET  Recent Labs  05/28/12 1225  NA 140  K 3.6  CL 104  CO2 25  CREATININE 0.90   LFT  Recent Labs  05/28/12 1225  PROT 6.1  AST 28  ALT 11  ALKPHOS 51  BILITOT 0.2*   PT/INR  Recent Labs  05/28/12 1225 05/29/12 0523  LABPROT 38.2* 19.1*  INR 4.24* 1.66*   PANCREAS No results found for this basename: LIPASE,  in the last 72 hours       Studies/Results: No results found.  Medications: I have reviewed the patient's current medications.  Assessment/Plan: 1. LGI bleed. Appears stable. Probably diverticular, could go ahead and discharge home on miralax and follow-up with Dr Dulce Sellar in 2-3 weeks.   EDWARDS JR,JAMES L 05/30/2012, 7:53 AM

## 2012-05-30 NOTE — Discharge Summary (Signed)
Physician Discharge Summary  Ashley [name] ZOX:[id number] DOB: 26-May-1928 DOA: 05/28/2012  PCP: Ailene Ravel, MD  Admit date: 05/28/2012 Discharge date: 05/30/2012  Time spent: 45 minutes  Recommendations for Outpatient Follow-up:  1. PCP Dr.Hamrick in 1 week 2. Resume Warfarin in week, if no further bleeding 3. Resume HCTZ in 1 week as BP tolerates 4. Dr.Outlaw, GI in 2-3 weeks  Discharge Diagnoses:  Principal Problem:   Rectal bleeding diverticular vs hemorroidal   Acute Blood loss anemia   Hypertension   Atrial fibrillation on coumadin, temporarily on hold now   GERD (gastroesophageal reflux disease)   Rheumatoid arthritis   Discharge Condition: stable  Diet recommendation: low Na  Filed Weights   05/28/12 1754  Weight: 63.095 kg (139 lb 1.6 oz)    History of present illness:  79 y/o female with hx of HTN, GERD, RA, right renal cell CA status post nephrectomy, hx of unexplained syncope with internal loop recorder in place, afib on coumadin who recently had a colonoscopy by [facility] ( Dr Dulce Sellar) for Blood in stool and abnormal rectosigmoid thickening seen on recent CT done for abdominal pain. Scattered diverticula was seen without any bleeding, internal hemorrhoids and a poly which was removed showing tubular adenoma. Patient was resumed back on Coumadin after the procedure. She returned to the ED today after having several episode of bright red blood per rectum for past 2 days. She felt it headed but denies any syncope, chest pain, palpitations, shortness of breath, hematemesis, fever, chills, diarrhea or urinary symptoms. She has off and on lower abdominal pain which has been ongoing for past several weeks.  Patient follows with Coumadin clinic for INR monitoring and has not had it checked she had restarted it one week back. Denies any change in her medications. Denies taking over-the-counter NSAIDs.  Hospital Course:  Lower GI Bleed:  - suspected to be hemorroidal vs  diverticular in origin, recent colonoscopy per Dr.Outlaw showed diverticula and internal hemorroids - Has been hemodynamically stable, and bleeding was self limiting  - Was seen by [facility] Dr.Edwards in consultation who recommended supportive care   - transfused 1 unit PRBC on 3/13  - Coumadin has been held, INR was supra therapeutic on admission and this can be resumed in 1 week  Acute Blood loss anemia:   - Transfused1 unit PRBC on 3/13, Hb stable since 10.0 at discharge  Hypertension  -Blood pressure was soft on a couple of occasions and hence HCTZ was held and can be resumed as outpatient if BP starts creeping up  GERD (gastroesophageal reflux disease)  -Continue PPI    Procedures: None  Consultations: Dr.Edwards , [facility]  Discharge Exam: Filed Vitals:   05/29/12 1325 05/29/12 2029 05/30/12 0453 05/30/12 1330  BP: 128/72 105/48 93/57 125/79  Pulse: 68 57 58 81  Temp: 98.1 F (36.7 C) 98.3 F (36.8 C) 98.1 F (36.7 C) 98.1 F (36.7 C)  TempSrc: Oral Oral Oral Oral  Resp: 18 18 18 18   Height:      Weight:      SpO2:  100% 97% 99%    General: AAOx3 Cardiovascular: S1S2/RRR Respiratory: CTAB  Discharge Instructions  Discharge Orders   Future Orders Complete By Expires     Diet - low sodium heart healthy  As directed     Increase activity slowly  As directed         Medication List    STOP taking these medications  hydrochlorothiazide 12.5 MG tablet  Commonly known as:  HYDRODIURIL     warfarin 5 MG tablet  Commonly known as:  COUMADIN      TAKE these medications       apraclonidine 0.5 % ophthalmic solution  Commonly known as:  IOPIDINE  Place 1 drop into both eyes 2 (two) times daily.     bromfenac 0.09 % ophthalmic solution  Commonly known as:  XIBROM  Place 1 drop into both eyes 2 (two) times daily.     cycloSPORINE 0.05 % ophthalmic emulsion  Commonly known as:  RESTASIS  Place 1 drop into both eyes 2 (two) times daily.      hydrocortisone 2.5 % rectal cream  Commonly known as:  ANUSOL-HC  Place rectally 2 (two) times daily as needed for hemorrhoids.     latanoprost 0.005 % ophthalmic solution  Commonly known as:  XALATAN  Place 1 drop into both eyes at bedtime.     methylPREDNISolone 4 MG tablet  Commonly known as:  MEDROL  Take 4 mg by mouth daily.     multivitamin capsule  Take 1 capsule by mouth daily.     omega-3 acid ethyl esters 1 G capsule  Commonly known as:  LOVAZA  Take 1 g by mouth daily. Taking 1 1/2 tab daily     REFRESH TEARS 0.5 % Soln  Generic drug:  carboxymethylcellulose  Place 1 drop into both eyes 2 (two) times daily.     trazodone 300 MG tablet  Commonly known as:  DESYREL  Take 300 mg by mouth at bedtime.           Follow-up Information   Follow up with [facility], MD In 1 week.   Contact information:   Dr. Burnell Blanks [address] [place] [address] [phone]       Follow up with Coumadin. (resume coumadin in 1 week after FU with Dr.Hamrick)        The results of significant diagnostics from this hospitalization (including imaging, microbiology, ancillary and laboratory) are listed below for reference.    Significant Diagnostic Studies: Ct Abdomen Pelvis W Contrast  05/13/2012  *RADIOLOGY REPORT*  Clinical Data: History of right renal cell carcinoma with nephrectomy, now with abdominal pain and change in bowel habits. Weight loss.  CT ABDOMEN AND PELVIS WITH CONTRAST  Technique:  Multidetector CT imaging of the abdomen and pelvis was performed following the standard protocol during bolus administration of intravenous contrast.  Contrast: OMNIPAQUE IOHEXOL 300 MG/ML  SOLN  Comparison: CT abdomen pelvis of 07/01/2006  BUN and creatinine were obtained on site at [facility] at [address] Results:  BUN 13 mg/dL,  Creatinine 0.8 mg/dL.  Findings: The rounded opacity noted medially in the right lower lobe is stable and may  represent scarring with adjacent subpleural blebs present.  The liver enhances with no focal abnormality and no ductal dilatation is seen.  Surgical clips are present from prior cholecystectomy.  The pancreas is mildly atrophic and the pancreatic duct is not dilated.  The adrenal glands and spleen are unremarkable.  The stomach is moderately fluid distended with no significant abnormality noted.  The right kidney has been surgically resected previously with no recurrent tumor noted.  The left kidney appears compensatorily hypertrophied on delayed images the left renal pelvocaliceal system is unremarkable.  The abdominal aorta is normal in caliber with only mild atheromatous change present.  No aneurysm is seen.  No adenopathy is  noted.  There are multiple rectosigmoid colonic diverticula present.  There is some asymmetric thickening of the mucosa of the rectosigmoid colon.  This may simply be due to diverticulosis, but an underlying neoplasm of the rectosigmoid colon cannot be excluded.  No diverticulitis is seen.  The uterus is normal in size.  No adnexal lesion is noted.  There is no free fluid within the pelvis.  The urinary bladder is not well distended but is unremarkable.  The terminal ileum is unremarkable.  There is a compression deformity of L1 vertebral body of approximately 65%, new since the  lumbar spine films of October 2011.  There is slight retropulsion at that level.  IMPRESSION:  1.  65% compression deformity of L1 vertebral body with retropulsion.  This is new since October 2011. 2.  Probable diverticulosis of the rectosigmoid colon but somewhat asymmetric mucosal thickening makes exclusion of an underlying neoplasm difficult.  In view of the patient's symptoms consider colonoscopy. 3.  Stable right nephrectomy with compensatory hypertrophy of the left kidney. 4.  Stable rounded scarring in the posterior right lower lobe.   Original Report Authenticated By: Dwyane Dee, M.D.      Microbiology: No results found for this or any previous visit (from the past 240 hour(s)).   Labs: Basic Metabolic Panel:  Recent Labs Lab 05/28/12 1225  NA 140  K 3.6  CL 104  CO2 25  GLUCOSE 95  BUN 13  CREATININE 0.90  CALCIUM 9.2   Liver Function Tests:  Recent Labs Lab 05/28/12 1225  AST 28  ALT 11  ALKPHOS 51  BILITOT 0.2*  PROT 6.1  ALBUMIN 2.7*   No results found for this basename: LIPASE, AMYLASE,  in the last 168 hours No results found for this basename: AMMONIA,  in the last 168 hours CBC:  Recent Labs Lab 05/28/12 2007 05/29/12 0523 05/29/12 1524 05/29/12 1953 05/30/12 0430  WBC 11.3* 8.1 8.3 9.3 7.7  HGB 8.4* 7.8* 9.5* 10.6* 10.0*  HCT 25.6* 23.5* 28.5* 32.2* 29.8*  MCV 92.8 93.3 92.5 93.9 92.5  PLT 233 200 211 227 218   Cardiac Enzymes: No results found for this basename: CKTOTAL, CKMB, CKMBINDEX, TROPONINI,  in the last 168 hours BNP: BNP (last 3 results) No results found for this basename: PROBNP,  in the last 8760 hours CBG: No results found for this basename: GLUCAP,  in the last 168 hours     Signed:  JOSEPH,PREETHA  [facility] 05/30/2012, 1:33 PM

## 2012-06-02 DIAGNOSIS — IMO0002 Reserved for concepts with insufficient information to code with codable children: Secondary | ICD-10-CM | POA: Diagnosis not present

## 2012-06-02 DIAGNOSIS — I10 Essential (primary) hypertension: Secondary | ICD-10-CM | POA: Diagnosis not present

## 2012-06-02 DIAGNOSIS — K922 Gastrointestinal hemorrhage, unspecified: Secondary | ICD-10-CM | POA: Diagnosis not present

## 2012-06-02 DIAGNOSIS — R791 Abnormal coagulation profile: Secondary | ICD-10-CM | POA: Diagnosis not present

## 2012-06-04 DIAGNOSIS — I4891 Unspecified atrial fibrillation: Secondary | ICD-10-CM | POA: Diagnosis not present

## 2012-06-04 DIAGNOSIS — Z7901 Long term (current) use of anticoagulants: Secondary | ICD-10-CM | POA: Diagnosis not present

## 2012-06-11 DIAGNOSIS — Z7901 Long term (current) use of anticoagulants: Secondary | ICD-10-CM | POA: Diagnosis not present

## 2012-06-11 DIAGNOSIS — I4891 Unspecified atrial fibrillation: Secondary | ICD-10-CM | POA: Diagnosis not present

## 2012-06-25 DIAGNOSIS — I4891 Unspecified atrial fibrillation: Secondary | ICD-10-CM | POA: Diagnosis not present

## 2012-06-25 DIAGNOSIS — Z7901 Long term (current) use of anticoagulants: Secondary | ICD-10-CM | POA: Diagnosis not present

## 2012-07-02 DIAGNOSIS — I4891 Unspecified atrial fibrillation: Secondary | ICD-10-CM | POA: Diagnosis not present

## 2012-07-02 DIAGNOSIS — Z7901 Long term (current) use of anticoagulants: Secondary | ICD-10-CM | POA: Diagnosis not present

## 2012-07-23 DIAGNOSIS — Z7901 Long term (current) use of anticoagulants: Secondary | ICD-10-CM | POA: Diagnosis not present

## 2012-07-23 DIAGNOSIS — I4891 Unspecified atrial fibrillation: Secondary | ICD-10-CM | POA: Diagnosis not present

## 2012-07-30 DIAGNOSIS — Z7901 Long term (current) use of anticoagulants: Secondary | ICD-10-CM | POA: Diagnosis not present

## 2012-07-30 DIAGNOSIS — I4891 Unspecified atrial fibrillation: Secondary | ICD-10-CM | POA: Diagnosis not present

## 2012-08-14 DIAGNOSIS — M899 Disorder of bone, unspecified: Secondary | ICD-10-CM | POA: Diagnosis not present

## 2012-08-14 DIAGNOSIS — M1990 Unspecified osteoarthritis, unspecified site: Secondary | ICD-10-CM | POA: Diagnosis not present

## 2012-08-14 DIAGNOSIS — M069 Rheumatoid arthritis, unspecified: Secondary | ICD-10-CM | POA: Diagnosis not present

## 2012-08-14 DIAGNOSIS — I4891 Unspecified atrial fibrillation: Secondary | ICD-10-CM | POA: Diagnosis not present

## 2012-08-14 DIAGNOSIS — Z7901 Long term (current) use of anticoagulants: Secondary | ICD-10-CM | POA: Diagnosis not present

## 2012-09-01 DIAGNOSIS — R109 Unspecified abdominal pain: Secondary | ICD-10-CM | POA: Diagnosis not present

## 2012-09-01 DIAGNOSIS — K5900 Constipation, unspecified: Secondary | ICD-10-CM | POA: Diagnosis not present

## 2012-09-04 DIAGNOSIS — Z1212 Encounter for screening for malignant neoplasm of rectum: Secondary | ICD-10-CM | POA: Diagnosis not present

## 2012-09-10 ENCOUNTER — Ambulatory Visit
Admission: RE | Admit: 2012-09-10 | Discharge: 2012-09-10 | Disposition: A | Discharge status: Home / Self Care | Payer: Medicare Other | Source: Ambulatory Visit | Attending: Family Medicine | Admitting: Family Medicine

## 2012-09-10 ENCOUNTER — Other Ambulatory Visit: Payer: Self-pay | Admitting: Family Medicine

## 2012-09-10 DIAGNOSIS — R109 Unspecified abdominal pain: Secondary | ICD-10-CM

## 2012-09-10 DIAGNOSIS — M4120 Other idiopathic scoliosis, site unspecified: Secondary | ICD-10-CM | POA: Diagnosis not present

## 2012-09-10 DIAGNOSIS — K5900 Constipation, unspecified: Secondary | ICD-10-CM

## 2012-09-29 DIAGNOSIS — Z7901 Long term (current) use of anticoagulants: Secondary | ICD-10-CM | POA: Diagnosis not present

## 2012-09-29 DIAGNOSIS — I4891 Unspecified atrial fibrillation: Secondary | ICD-10-CM | POA: Diagnosis not present

## 2012-10-09 DIAGNOSIS — H4011X0 Primary open-angle glaucoma, stage unspecified: Secondary | ICD-10-CM | POA: Diagnosis not present

## 2012-10-09 DIAGNOSIS — H5200 Hypermetropia, unspecified eye: Secondary | ICD-10-CM | POA: Diagnosis not present

## 2012-10-09 DIAGNOSIS — H04129 Dry eye syndrome of unspecified lacrimal gland: Secondary | ICD-10-CM | POA: Diagnosis not present

## 2012-11-04 DIAGNOSIS — I10 Essential (primary) hypertension: Secondary | ICD-10-CM | POA: Diagnosis not present

## 2012-11-04 DIAGNOSIS — E782 Mixed hyperlipidemia: Secondary | ICD-10-CM | POA: Diagnosis not present

## 2012-11-04 DIAGNOSIS — Z7901 Long term (current) use of anticoagulants: Secondary | ICD-10-CM | POA: Diagnosis not present

## 2012-11-04 DIAGNOSIS — I4891 Unspecified atrial fibrillation: Secondary | ICD-10-CM | POA: Diagnosis not present

## 2012-11-10 DIAGNOSIS — K5900 Constipation, unspecified: Secondary | ICD-10-CM | POA: Diagnosis not present

## 2012-11-14 DIAGNOSIS — M899 Disorder of bone, unspecified: Secondary | ICD-10-CM | POA: Diagnosis not present

## 2012-11-14 DIAGNOSIS — M1990 Unspecified osteoarthritis, unspecified site: Secondary | ICD-10-CM | POA: Diagnosis not present

## 2012-11-14 DIAGNOSIS — M069 Rheumatoid arthritis, unspecified: Secondary | ICD-10-CM | POA: Diagnosis not present

## 2012-11-25 DIAGNOSIS — I4891 Unspecified atrial fibrillation: Secondary | ICD-10-CM | POA: Diagnosis not present

## 2012-11-25 DIAGNOSIS — Z7901 Long term (current) use of anticoagulants: Secondary | ICD-10-CM | POA: Diagnosis not present

## 2012-11-28 ENCOUNTER — Other Ambulatory Visit: Payer: Self-pay

## 2012-11-28 DIAGNOSIS — Z1231 Encounter for screening mammogram for malignant neoplasm of breast: Secondary | ICD-10-CM

## 2012-12-15 DIAGNOSIS — Z7901 Long term (current) use of anticoagulants: Secondary | ICD-10-CM | POA: Diagnosis not present

## 2012-12-15 DIAGNOSIS — I4891 Unspecified atrial fibrillation: Secondary | ICD-10-CM | POA: Diagnosis not present

## 2012-12-16 DIAGNOSIS — Z23 Encounter for immunization: Secondary | ICD-10-CM | POA: Diagnosis not present

## 2012-12-29 ENCOUNTER — Ambulatory Visit
Admission: RE | Admit: 2012-12-29 | Discharge: 2012-12-29 | Disposition: A | Discharge status: Home / Self Care | Payer: Medicare Other | Source: Ambulatory Visit

## 2012-12-29 DIAGNOSIS — Z1231 Encounter for screening mammogram for malignant neoplasm of breast: Secondary | ICD-10-CM | POA: Diagnosis not present

## 2013-01-05 ENCOUNTER — Ambulatory Visit (INDEPENDENT_AMBULATORY_CARE_PROVIDER_SITE_OTHER): Payer: Medicare Other | Admitting: Pharmacist

## 2013-01-05 DIAGNOSIS — I4891 Unspecified atrial fibrillation: Secondary | ICD-10-CM

## 2013-02-02 ENCOUNTER — Ambulatory Visit (INDEPENDENT_AMBULATORY_CARE_PROVIDER_SITE_OTHER): Payer: Medicare Other | Admitting: Pharmacist

## 2013-02-02 DIAGNOSIS — I4891 Unspecified atrial fibrillation: Secondary | ICD-10-CM

## 2013-02-11 DIAGNOSIS — Z1331 Encounter for screening for depression: Secondary | ICD-10-CM | POA: Diagnosis not present

## 2013-02-11 DIAGNOSIS — R21 Rash and other nonspecific skin eruption: Secondary | ICD-10-CM | POA: Diagnosis not present

## 2013-02-11 DIAGNOSIS — Z9181 History of falling: Secondary | ICD-10-CM | POA: Diagnosis not present

## 2013-02-17 DIAGNOSIS — M069 Rheumatoid arthritis, unspecified: Secondary | ICD-10-CM | POA: Diagnosis not present

## 2013-02-17 DIAGNOSIS — M899 Disorder of bone, unspecified: Secondary | ICD-10-CM | POA: Diagnosis not present

## 2013-02-17 DIAGNOSIS — M67919 Unspecified disorder of synovium and tendon, unspecified shoulder: Secondary | ICD-10-CM | POA: Diagnosis not present

## 2013-02-17 DIAGNOSIS — M1990 Unspecified osteoarthritis, unspecified site: Secondary | ICD-10-CM | POA: Diagnosis not present

## 2013-02-19 DIAGNOSIS — H04129 Dry eye syndrome of unspecified lacrimal gland: Secondary | ICD-10-CM | POA: Diagnosis not present

## 2013-02-19 DIAGNOSIS — H4011X0 Primary open-angle glaucoma, stage unspecified: Secondary | ICD-10-CM | POA: Diagnosis not present

## 2013-02-24 DIAGNOSIS — H4720 Unspecified optic atrophy: Secondary | ICD-10-CM | POA: Diagnosis not present

## 2013-02-24 DIAGNOSIS — Z961 Presence of intraocular lens: Secondary | ICD-10-CM | POA: Diagnosis not present

## 2013-02-24 DIAGNOSIS — H4011X0 Primary open-angle glaucoma, stage unspecified: Secondary | ICD-10-CM | POA: Diagnosis not present

## 2013-02-24 DIAGNOSIS — H409 Unspecified glaucoma: Secondary | ICD-10-CM | POA: Diagnosis not present

## 2013-02-25 ENCOUNTER — Ambulatory Visit (INDEPENDENT_AMBULATORY_CARE_PROVIDER_SITE_OTHER): Payer: Medicare Other | Admitting: *Deleted

## 2013-02-25 ENCOUNTER — Encounter: Payer: Self-pay | Admitting: Internal Medicine

## 2013-02-25 ENCOUNTER — Ambulatory Visit (INDEPENDENT_AMBULATORY_CARE_PROVIDER_SITE_OTHER): Payer: Medicare Other | Admitting: Pharmacist

## 2013-02-25 DIAGNOSIS — I4891 Unspecified atrial fibrillation: Secondary | ICD-10-CM

## 2013-02-25 DIAGNOSIS — R55 Syncope and collapse: Secondary | ICD-10-CM

## 2013-02-25 LAB — MDC_IDC_ENUM_SESS_TYPE_INCLINIC: Implantable Pulse Generator Model: 9529

## 2013-02-25 NOTE — Progress Notes (Signed)
Loop check in clinic.  Pt with 3 tachy episodes---max dur. 13 sec, Max V 200bpm---SVT ; 2 brady episodes---7 sec each---slowest rate 43 bpm---undersensing; 3 asystole episodes---max dur. 4 sec (on 09-06-12)---EGMs for asystole lasting 3 sec appear to be true during NSR (implanted for syncope)---symptoms during episodes remains unclear. Patient also had 134 AF episodes (0.1%)+ Warfarin---max dur. 24 mins, Max V "353" bpm---episodes appear to be true AF/oversensing of noise. Episodes were mostly appropriate/undersensing/oversensing.  Plan to follow up with [facility] on 3-10 or sooner if needed.

## 2013-02-27 DIAGNOSIS — H4011X0 Primary open-angle glaucoma, stage unspecified: Secondary | ICD-10-CM | POA: Diagnosis not present

## 2013-02-27 DIAGNOSIS — H409 Unspecified glaucoma: Secondary | ICD-10-CM | POA: Diagnosis not present

## 2013-03-10 DIAGNOSIS — Z961 Presence of intraocular lens: Secondary | ICD-10-CM | POA: Diagnosis not present

## 2013-03-10 DIAGNOSIS — H409 Unspecified glaucoma: Secondary | ICD-10-CM | POA: Diagnosis not present

## 2013-03-10 DIAGNOSIS — H4011X0 Primary open-angle glaucoma, stage unspecified: Secondary | ICD-10-CM | POA: Diagnosis not present

## 2013-03-13 ENCOUNTER — Ambulatory Visit (INDEPENDENT_AMBULATORY_CARE_PROVIDER_SITE_OTHER): Payer: Medicare Other | Admitting: Pharmacist

## 2013-03-13 DIAGNOSIS — I4891 Unspecified atrial fibrillation: Secondary | ICD-10-CM | POA: Diagnosis not present

## 2013-03-13 LAB — POCT INR: INR: 2.7

## 2013-04-10 ENCOUNTER — Ambulatory Visit (INDEPENDENT_AMBULATORY_CARE_PROVIDER_SITE_OTHER): Payer: Medicare Other | Admitting: Pharmacist

## 2013-04-10 DIAGNOSIS — I4891 Unspecified atrial fibrillation: Secondary | ICD-10-CM

## 2013-04-10 DIAGNOSIS — Z5181 Encounter for therapeutic drug level monitoring: Secondary | ICD-10-CM | POA: Insufficient documentation

## 2013-04-10 LAB — POCT INR: INR: 1.2

## 2013-04-14 DIAGNOSIS — H409 Unspecified glaucoma: Secondary | ICD-10-CM | POA: Diagnosis not present

## 2013-04-14 DIAGNOSIS — Z961 Presence of intraocular lens: Secondary | ICD-10-CM | POA: Diagnosis not present

## 2013-04-14 DIAGNOSIS — H4011X0 Primary open-angle glaucoma, stage unspecified: Secondary | ICD-10-CM | POA: Diagnosis not present

## 2013-04-16 ENCOUNTER — Encounter: Payer: Self-pay | Admitting: Cardiology

## 2013-04-16 ENCOUNTER — Ambulatory Visit (INDEPENDENT_AMBULATORY_CARE_PROVIDER_SITE_OTHER): Payer: Medicare Other | Admitting: *Deleted

## 2013-04-16 DIAGNOSIS — I4891 Unspecified atrial fibrillation: Secondary | ICD-10-CM | POA: Diagnosis not present

## 2013-04-16 DIAGNOSIS — Z9181 History of falling: Secondary | ICD-10-CM | POA: Diagnosis not present

## 2013-04-16 DIAGNOSIS — Z5181 Encounter for therapeutic drug level monitoring: Secondary | ICD-10-CM

## 2013-04-16 DIAGNOSIS — IMO0002 Reserved for concepts with insufficient information to code with codable children: Secondary | ICD-10-CM | POA: Diagnosis not present

## 2013-04-16 DIAGNOSIS — Z1331 Encounter for screening for depression: Secondary | ICD-10-CM | POA: Diagnosis not present

## 2013-04-16 DIAGNOSIS — E782 Mixed hyperlipidemia: Secondary | ICD-10-CM | POA: Diagnosis not present

## 2013-04-16 DIAGNOSIS — Z79899 Other long term (current) drug therapy: Secondary | ICD-10-CM | POA: Diagnosis not present

## 2013-04-16 DIAGNOSIS — G4700 Insomnia, unspecified: Secondary | ICD-10-CM | POA: Diagnosis not present

## 2013-04-16 DIAGNOSIS — M810 Age-related osteoporosis without current pathological fracture: Secondary | ICD-10-CM | POA: Diagnosis not present

## 2013-04-16 LAB — POCT INR: INR: 1.8

## 2013-04-30 ENCOUNTER — Ambulatory Visit (INDEPENDENT_AMBULATORY_CARE_PROVIDER_SITE_OTHER): Payer: Medicare Other | Admitting: Pharmacist

## 2013-04-30 DIAGNOSIS — I4891 Unspecified atrial fibrillation: Secondary | ICD-10-CM | POA: Diagnosis not present

## 2013-04-30 DIAGNOSIS — Z5181 Encounter for therapeutic drug level monitoring: Secondary | ICD-10-CM | POA: Diagnosis not present

## 2013-04-30 LAB — POCT INR: INR: 2.6

## 2013-05-20 DIAGNOSIS — M1990 Unspecified osteoarthritis, unspecified site: Secondary | ICD-10-CM | POA: Diagnosis not present

## 2013-05-20 DIAGNOSIS — M949 Disorder of cartilage, unspecified: Secondary | ICD-10-CM | POA: Diagnosis not present

## 2013-05-20 DIAGNOSIS — M899 Disorder of bone, unspecified: Secondary | ICD-10-CM | POA: Diagnosis not present

## 2013-05-20 DIAGNOSIS — M069 Rheumatoid arthritis, unspecified: Secondary | ICD-10-CM | POA: Diagnosis not present

## 2013-05-20 DIAGNOSIS — M67919 Unspecified disorder of synovium and tendon, unspecified shoulder: Secondary | ICD-10-CM | POA: Diagnosis not present

## 2013-05-26 ENCOUNTER — Encounter: Payer: Self-pay | Admitting: Internal Medicine

## 2013-05-26 ENCOUNTER — Ambulatory Visit (INDEPENDENT_AMBULATORY_CARE_PROVIDER_SITE_OTHER): Payer: Medicare Other | Admitting: Pharmacist Clinician (PhC)/ Clinical Pharmacy Specialist

## 2013-05-26 ENCOUNTER — Ambulatory Visit (INDEPENDENT_AMBULATORY_CARE_PROVIDER_SITE_OTHER): Payer: Medicare Other | Admitting: Internal Medicine

## 2013-05-26 VITALS — BP 136/78 | HR 66 | Ht 64.5 in | Wt 119.8 lb

## 2013-05-26 DIAGNOSIS — Z5181 Encounter for therapeutic drug level monitoring: Secondary | ICD-10-CM

## 2013-05-26 DIAGNOSIS — I4891 Unspecified atrial fibrillation: Secondary | ICD-10-CM

## 2013-05-26 DIAGNOSIS — I10 Essential (primary) hypertension: Secondary | ICD-10-CM

## 2013-05-26 DIAGNOSIS — R55 Syncope and collapse: Secondary | ICD-10-CM

## 2013-05-26 LAB — MDC_IDC_ENUM_SESS_TYPE_INCLINIC: Implantable Pulse Generator Model: 9529

## 2013-05-26 LAB — POCT INR: INR: 2.5

## 2013-05-26 NOTE — Patient Instructions (Addendum)
Your physician wants you to follow-up in: 6 months with Dr Taylor You will receive a reminder letter in the mail two months in advance. If you don't receive a letter, please call our office to schedule the follow-up appointment.  

## 2013-05-26 NOTE — Progress Notes (Signed)
HPI Mrs. Ast returns today for followup. She is a very pleasant elderly woman with a history of unexplained syncope, hypertension, and bradycardia. She is status post insertion of an implantable loop recorder. In the interim, she has had occasional dizzy spell but no frank syncope. She denies chest pain or shortness of breath. Since she lost some of her vision, she admits to being more sedentary. She notes that her husband will often cook dinner for her. Her main complaint today is anorexia.  No Known Allergies   Current Outpatient Prescriptions  Medication Sig Dispense Refill  . bromfenac (XIBROM) 0.09 % ophthalmic solution Place 1 drop into both eyes 2 (two) times daily.       . Calcium Carbonate (CALCIUM 600 PO) Take 600 mg by mouth daily.      . carboxymethylcellulose (REFRESH TEARS) 0.5 % SOLN Place 1 drop into both eyes 2 (two) times daily.      . cycloSPORINE (RESTASIS) 0.05 % ophthalmic emulsion Place 1 drop into both eyes 2 (two) times daily.        . fenofibrate (TRICOR) 145 MG tablet Take 145 mg by mouth daily.      . hydrochlorothiazide (MICROZIDE) 12.5 MG capsule Take 12.5 mg by mouth daily.      . hydrocortisone (ANUSOL-HC) 2.5 % rectal cream Place rectally 2 (two) times daily as needed for hemorrhoids.  30 g  0  . latanoprost (XALATAN) 0.005 % ophthalmic solution Place 1 drop into both eyes at bedtime.      Marland Kitchen leflunomide (ARAVA) 20 MG tablet Take 20 mg by mouth daily.      . methylPREDNISolone (MEDROL) 4 MG tablet Take 4 mg by mouth daily.       . Multiple Vitamin (MULTIVITAMIN) capsule Take 1 capsule by mouth daily.        Marland Kitchen omega-3 acid ethyl esters (LOVAZA) 1 G capsule Take 1 tablet in the morning and 1/2 tablet in the evening      . sulfaSALAzine (AZULFIDINE) 500 MG tablet Take 500 mg by mouth 2 (two) times daily.      . trazodone (DESYREL) 300 MG tablet Take 300 mg by mouth at bedtime.        Marland Kitchen warfarin (COUMADIN) 5 MG tablet Take 5 mg by mouth as directed.       No  current facility-administered medications for this visit.     Past Medical History  Diagnosis Date  . Syncope   . HTN (hypertension)   . Uterine prolapse   . Cryptococcal pneumonitis     right lower lobe  . GERD (gastroesophageal reflux disease)   . Rheumatoid arthritis(714.0)   . Choroid melanoma of left eye   . Renal cell carcinoma     Left kidney    ROS:   All systems reviewed and negative except as noted in the HPI.   Past Surgical History  Procedure Laterality Date  . Nephrectomy      native  . Eye surgery    . Bladder tact    . Partial removal of lung    . Tumor removed from left eye    . Cholecystectomy    . Brain meningioma excision    . Implanted loop recorder      Left Breast  . Colonoscopy N/A 05/21/2012    Procedure: COLONOSCOPY;  Surgeon: Arta Silence, MD;  Location: [facility];  Service: Endoscopy;  Laterality: N/A;     Family History  Problem Relation Age of Onset  .  Heart disease Maternal Aunt      History   Social History  . Marital Status: Married    Spouse Name: N/A    Number of Children: N/A  . Years of Education: N/A   Occupational History  . retired    Social History Main Topics  . Smoking status: Never Smoker   . Smokeless tobacco: Never Used  . Alcohol Use: No  . Drug Use: No  . Sexual Activity: Not on file   Other Topics Concern  . Not on file   Social History Narrative  . No narrative on file     BP 136/78  Pulse 66  Ht 5' 4.5" (1.638 m)  Wt 119 lb 12.8 oz (54.341 kg)  BMI 20.25 kg/m2  Physical Exam:  Well appearing elderly woman, NAD HEENT: Unremarkable Neck:  No JVD, no thyromegally Lungs:  Clear with no wheezes, rales, or rhonchi. HEART:  Regular rate rhythm, no murmurs, no rubs, no clicks. Well-healed implantable loop recorder incision. Abd:  soft, positive bowel sounds, no organomegally, no rebound, no guarding Ext:  2 plus pulses, no edema, no cyanosis, no clubbing Skin:  No rashes no  nodules Neuro:  CN II through XII intact, motor grossly intact  DEVICE  Normal device function.  See PaceArt for details. ILR interogated and at end of life. She has had nocturnal bradycardia and episodes of SVT  Assess/Plan:

## 2013-05-26 NOTE — Assessment & Plan Note (Signed)
The etiology remains unclear.  I suspect autonomic dysfunction. She has had prolonged pauses which occurred during the middle of the night. She has also had asymptomatic SVT. No obvious correlation of arrhythmia to her symptoms.

## 2013-05-26 NOTE — Assessment & Plan Note (Signed)
Her blood pressure is well controlled today. No change in meds.  

## 2013-06-23 ENCOUNTER — Ambulatory Visit (INDEPENDENT_AMBULATORY_CARE_PROVIDER_SITE_OTHER): Payer: Medicare Other | Admitting: *Deleted

## 2013-06-23 DIAGNOSIS — I4891 Unspecified atrial fibrillation: Secondary | ICD-10-CM

## 2013-06-23 DIAGNOSIS — Z5181 Encounter for therapeutic drug level monitoring: Secondary | ICD-10-CM | POA: Diagnosis not present

## 2013-06-23 LAB — POCT INR: INR: 3.7

## 2013-06-30 DIAGNOSIS — Z961 Presence of intraocular lens: Secondary | ICD-10-CM | POA: Diagnosis not present

## 2013-06-30 DIAGNOSIS — H4011X0 Primary open-angle glaucoma, stage unspecified: Secondary | ICD-10-CM | POA: Diagnosis not present

## 2013-06-30 DIAGNOSIS — H409 Unspecified glaucoma: Secondary | ICD-10-CM | POA: Diagnosis not present

## 2013-07-09 ENCOUNTER — Ambulatory Visit (INDEPENDENT_AMBULATORY_CARE_PROVIDER_SITE_OTHER): Payer: Medicare Other | Admitting: Pharmacist Clinician (PhC)/ Clinical Pharmacy Specialist

## 2013-07-09 DIAGNOSIS — Z5181 Encounter for therapeutic drug level monitoring: Secondary | ICD-10-CM

## 2013-07-09 DIAGNOSIS — I4891 Unspecified atrial fibrillation: Secondary | ICD-10-CM

## 2013-07-09 LAB — POCT INR: INR: 5.1

## 2013-07-15 DIAGNOSIS — R404 Transient alteration of awareness: Secondary | ICD-10-CM | POA: Diagnosis not present

## 2013-07-15 DIAGNOSIS — R51 Headache: Secondary | ICD-10-CM | POA: Diagnosis not present

## 2013-07-15 DIAGNOSIS — R42 Dizziness and giddiness: Secondary | ICD-10-CM | POA: Diagnosis not present

## 2013-07-15 DIAGNOSIS — R109 Unspecified abdominal pain: Secondary | ICD-10-CM | POA: Diagnosis not present

## 2013-07-15 DIAGNOSIS — S1093XA Contusion of unspecified part of neck, initial encounter: Secondary | ICD-10-CM | POA: Diagnosis not present

## 2013-07-15 DIAGNOSIS — S0003XA Contusion of scalp, initial encounter: Secondary | ICD-10-CM | POA: Diagnosis not present

## 2013-07-16 ENCOUNTER — Ambulatory Visit (INDEPENDENT_AMBULATORY_CARE_PROVIDER_SITE_OTHER): Payer: Medicare Other | Admitting: Pharmacist

## 2013-07-16 ENCOUNTER — Ambulatory Visit
Admission: RE | Admit: 2013-07-16 | Discharge: 2013-07-16 | Disposition: A | Discharge status: Home / Self Care | Payer: Medicare Other | Source: Ambulatory Visit | Attending: Family Medicine | Admitting: Family Medicine

## 2013-07-16 ENCOUNTER — Other Ambulatory Visit: Payer: Self-pay | Admitting: Family Medicine

## 2013-07-16 DIAGNOSIS — S0990XA Unspecified injury of head, initial encounter: Secondary | ICD-10-CM | POA: Diagnosis not present

## 2013-07-16 DIAGNOSIS — I4891 Unspecified atrial fibrillation: Secondary | ICD-10-CM | POA: Diagnosis not present

## 2013-07-16 DIAGNOSIS — Z5181 Encounter for therapeutic drug level monitoring: Secondary | ICD-10-CM | POA: Diagnosis not present

## 2013-07-16 DIAGNOSIS — W19XXXA Unspecified fall, initial encounter: Secondary | ICD-10-CM

## 2013-07-16 DIAGNOSIS — R220 Localized swelling, mass and lump, head: Secondary | ICD-10-CM | POA: Diagnosis not present

## 2013-07-16 DIAGNOSIS — T148XXA Other injury of unspecified body region, initial encounter: Secondary | ICD-10-CM

## 2013-07-16 LAB — POCT INR: INR: 5.1

## 2013-07-16 MED ORDER — PHYTONADIONE 5 MG PO TABS: 2.5000 mg | ORAL_TABLET | Freq: Once | ORAL | Status: DC

## 2013-07-23 ENCOUNTER — Ambulatory Visit (INDEPENDENT_AMBULATORY_CARE_PROVIDER_SITE_OTHER): Payer: Medicare Other | Admitting: *Deleted

## 2013-07-23 DIAGNOSIS — Z5181 Encounter for therapeutic drug level monitoring: Secondary | ICD-10-CM | POA: Diagnosis not present

## 2013-07-23 DIAGNOSIS — I4891 Unspecified atrial fibrillation: Secondary | ICD-10-CM | POA: Diagnosis not present

## 2013-07-23 LAB — POCT INR: INR: 1.2

## 2013-07-23 MED ORDER — WARFARIN SODIUM 1 MG PO TABS
1.0000 mg | ORAL_TABLET | ORAL | Status: DC
Start: 2013-07-23 — End: 2013-08-14

## 2013-07-29 ENCOUNTER — Ambulatory Visit (INDEPENDENT_AMBULATORY_CARE_PROVIDER_SITE_OTHER): Payer: Medicare Other | Admitting: Pharmacist

## 2013-07-29 DIAGNOSIS — I4891 Unspecified atrial fibrillation: Secondary | ICD-10-CM

## 2013-07-29 DIAGNOSIS — Z5181 Encounter for therapeutic drug level monitoring: Secondary | ICD-10-CM

## 2013-07-29 LAB — POCT INR: INR: 1.4

## 2013-08-06 ENCOUNTER — Ambulatory Visit (INDEPENDENT_AMBULATORY_CARE_PROVIDER_SITE_OTHER): Payer: Medicare Other | Admitting: *Deleted

## 2013-08-06 DIAGNOSIS — Z5181 Encounter for therapeutic drug level monitoring: Secondary | ICD-10-CM | POA: Diagnosis not present

## 2013-08-06 DIAGNOSIS — I4891 Unspecified atrial fibrillation: Secondary | ICD-10-CM | POA: Diagnosis not present

## 2013-08-06 LAB — POCT INR: INR: 3.8

## 2013-08-11 DIAGNOSIS — R634 Abnormal weight loss: Secondary | ICD-10-CM | POA: Diagnosis not present

## 2013-08-11 DIAGNOSIS — R110 Nausea: Secondary | ICD-10-CM | POA: Diagnosis not present

## 2013-08-11 DIAGNOSIS — R109 Unspecified abdominal pain: Secondary | ICD-10-CM | POA: Diagnosis not present

## 2013-08-12 ENCOUNTER — Other Ambulatory Visit: Payer: Self-pay | Admitting: Family Medicine

## 2013-08-12 DIAGNOSIS — R634 Abnormal weight loss: Secondary | ICD-10-CM

## 2013-08-12 DIAGNOSIS — R109 Unspecified abdominal pain: Secondary | ICD-10-CM

## 2013-08-12 DIAGNOSIS — R110 Nausea: Secondary | ICD-10-CM

## 2013-08-13 ENCOUNTER — Inpatient Hospital Stay (HOSPITAL_COMMUNITY)
Admission: EM | Admit: 2013-08-13 | Discharge: 2013-08-19 | DRG: 391 | Disposition: A | Discharge status: Home Health | Payer: Medicare Other | Attending: Internal Medicine | Admitting: Internal Medicine

## 2013-08-13 ENCOUNTER — Other Ambulatory Visit: Payer: Self-pay | Admitting: Family Medicine

## 2013-08-13 ENCOUNTER — Ambulatory Visit
Admission: RE | Admit: 2013-08-13 | Discharge: 2013-08-13 | Disposition: A | Discharge status: Home / Self Care | Payer: Medicare Other | Source: Ambulatory Visit | Attending: Family Medicine | Admitting: Family Medicine

## 2013-08-13 ENCOUNTER — Other Ambulatory Visit: Payer: Medicare Other

## 2013-08-13 ENCOUNTER — Encounter (HOSPITAL_COMMUNITY): Payer: Self-pay | Admitting: General Practice

## 2013-08-13 ENCOUNTER — Emergency Department (HOSPITAL_COMMUNITY): Payer: Medicare Other

## 2013-08-13 DIAGNOSIS — R4181 Age-related cognitive decline: Secondary | ICD-10-CM | POA: Diagnosis present

## 2013-08-13 DIAGNOSIS — Z85528 Personal history of other malignant neoplasm of kidney: Secondary | ICD-10-CM | POA: Diagnosis not present

## 2013-08-13 DIAGNOSIS — K219 Gastro-esophageal reflux disease without esophagitis: Secondary | ICD-10-CM

## 2013-08-13 DIAGNOSIS — K625 Hemorrhage of anus and rectum: Secondary | ICD-10-CM

## 2013-08-13 DIAGNOSIS — J4489 Other specified chronic obstructive pulmonary disease: Secondary | ICD-10-CM | POA: Diagnosis present

## 2013-08-13 DIAGNOSIS — E43 Unspecified severe protein-calorie malnutrition: Secondary | ICD-10-CM

## 2013-08-13 DIAGNOSIS — D638 Anemia in other chronic diseases classified elsewhere: Secondary | ICD-10-CM | POA: Diagnosis present

## 2013-08-13 DIAGNOSIS — Z9089 Acquired absence of other organs: Secondary | ICD-10-CM

## 2013-08-13 DIAGNOSIS — S0003XA Contusion of scalp, initial encounter: Secondary | ICD-10-CM | POA: Diagnosis not present

## 2013-08-13 DIAGNOSIS — N390 Urinary tract infection, site not specified: Secondary | ICD-10-CM

## 2013-08-13 DIAGNOSIS — K5792 Diverticulitis of intestine, part unspecified, without perforation or abscess without bleeding: Secondary | ICD-10-CM

## 2013-08-13 DIAGNOSIS — Z905 Acquired absence of kidney: Secondary | ICD-10-CM

## 2013-08-13 DIAGNOSIS — I4891 Unspecified atrial fibrillation: Secondary | ICD-10-CM | POA: Diagnosis not present

## 2013-08-13 DIAGNOSIS — D649 Anemia, unspecified: Secondary | ICD-10-CM | POA: Diagnosis not present

## 2013-08-13 DIAGNOSIS — R634 Abnormal weight loss: Secondary | ICD-10-CM

## 2013-08-13 DIAGNOSIS — E876 Hypokalemia: Secondary | ICD-10-CM | POA: Diagnosis not present

## 2013-08-13 DIAGNOSIS — J449 Chronic obstructive pulmonary disease, unspecified: Secondary | ICD-10-CM | POA: Diagnosis present

## 2013-08-13 DIAGNOSIS — Z79899 Other long term (current) drug therapy: Secondary | ICD-10-CM | POA: Diagnosis not present

## 2013-08-13 DIAGNOSIS — Z23 Encounter for immunization: Secondary | ICD-10-CM

## 2013-08-13 DIAGNOSIS — Z8249 Family history of ischemic heart disease and other diseases of the circulatory system: Secondary | ICD-10-CM

## 2013-08-13 DIAGNOSIS — R109 Unspecified abdominal pain: Secondary | ICD-10-CM | POA: Diagnosis not present

## 2013-08-13 DIAGNOSIS — Z860100 Personal history of colon polyps, unspecified: Secondary | ICD-10-CM

## 2013-08-13 DIAGNOSIS — D62 Acute posthemorrhagic anemia: Secondary | ICD-10-CM

## 2013-08-13 DIAGNOSIS — K5780 Diverticulitis of intestine, part unspecified, with perforation and abscess without bleeding: Secondary | ICD-10-CM

## 2013-08-13 DIAGNOSIS — R636 Underweight: Secondary | ICD-10-CM | POA: Diagnosis present

## 2013-08-13 DIAGNOSIS — E785 Hyperlipidemia, unspecified: Secondary | ICD-10-CM | POA: Diagnosis present

## 2013-08-13 DIAGNOSIS — R55 Syncope and collapse: Secondary | ICD-10-CM

## 2013-08-13 DIAGNOSIS — R630 Anorexia: Secondary | ICD-10-CM | POA: Diagnosis present

## 2013-08-13 DIAGNOSIS — Z681 Body mass index (BMI) 19 or less, adult: Secondary | ICD-10-CM

## 2013-08-13 DIAGNOSIS — K630 Abscess of intestine: Secondary | ICD-10-CM | POA: Diagnosis present

## 2013-08-13 DIAGNOSIS — Z7901 Long term (current) use of anticoagulants: Secondary | ICD-10-CM

## 2013-08-13 DIAGNOSIS — R110 Nausea: Secondary | ICD-10-CM

## 2013-08-13 DIAGNOSIS — Z5181 Encounter for therapeutic drug level monitoring: Secondary | ICD-10-CM

## 2013-08-13 DIAGNOSIS — C649 Malignant neoplasm of unspecified kidney, except renal pelvis: Secondary | ICD-10-CM

## 2013-08-13 DIAGNOSIS — M069 Rheumatoid arthritis, unspecified: Secondary | ICD-10-CM | POA: Diagnosis present

## 2013-08-13 DIAGNOSIS — K5732 Diverticulitis of large intestine without perforation or abscess without bleeding: Principal | ICD-10-CM

## 2013-08-13 DIAGNOSIS — I10 Essential (primary) hypertension: Secondary | ICD-10-CM | POA: Diagnosis not present

## 2013-08-13 DIAGNOSIS — S1093XA Contusion of unspecified part of neck, initial encounter: Secondary | ICD-10-CM | POA: Diagnosis not present

## 2013-08-13 DIAGNOSIS — K5900 Constipation, unspecified: Secondary | ICD-10-CM | POA: Diagnosis not present

## 2013-08-13 DIAGNOSIS — K5720 Diverticulitis of large intestine with perforation and abscess without bleeding: Secondary | ICD-10-CM

## 2013-08-13 HISTORY — DX: Headache: R51

## 2013-08-13 HISTORY — DX: Unspecified asthma, uncomplicated: J45.909

## 2013-08-13 HISTORY — DX: Unspecified osteoarthritis, unspecified site: M19.90

## 2013-08-13 LAB — COMPREHENSIVE METABOLIC PANEL
ALT: 11 U/L (ref 0–35)
AST: 32 U/L (ref 0–37)
Albumin: 2.6 g/dL — ABNORMAL LOW (ref 3.5–5.2)
Alkaline Phosphatase: 59 U/L (ref 39–117)
BUN: 9 mg/dL (ref 6–23)
CALCIUM: 9.8 mg/dL (ref 8.4–10.5)
CO2: 22 mEq/L (ref 19–32)
CREATININE: 0.69 mg/dL (ref 0.50–1.10)
Chloride: 101 mEq/L (ref 96–112)
GFR calc non Af Amer: 79 mL/min — ABNORMAL LOW (ref >90)
GLUCOSE: 92 mg/dL (ref 70–99)
Potassium: 3.7 mEq/L (ref 3.7–5.3)
Sodium: 138 mEq/L (ref 137–147)
TOTAL PROTEIN: 7.1 g/dL (ref 6.0–8.3)
Total Bilirubin: 0.6 mg/dL (ref 0.3–1.2)

## 2013-08-13 LAB — URINALYSIS, ROUTINE W REFLEX MICROSCOPIC
Bilirubin Urine: NEGATIVE
Glucose, UA: NEGATIVE mg/dL
Ketones, ur: NEGATIVE mg/dL
NITRITE: NEGATIVE
PH: 6 (ref 5.0–8.0)
Protein, ur: NEGATIVE mg/dL
SPECIFIC GRAVITY, URINE: 1.005 (ref 1.005–1.030)
Urobilinogen, UA: 1 mg/dL (ref 0.0–1.0)

## 2013-08-13 LAB — CBC WITH DIFFERENTIAL/PLATELET
BASOS ABS: 0 10*3/uL (ref 0.0–0.1)
Basophils Relative: 0 % (ref 0–1)
EOS ABS: 0 10*3/uL (ref 0.0–0.7)
Eosinophils Relative: 0 % (ref 0–5)
HCT: 25.9 % — ABNORMAL LOW (ref 36.0–46.0)
Hemoglobin: 8.4 g/dL — ABNORMAL LOW (ref 12.0–15.0)
LYMPHS PCT: 15 % (ref 12–46)
Lymphs Abs: 1.5 10*3/uL (ref 0.7–4.0)
MCH: 30.5 pg (ref 26.0–34.0)
MCHC: 32.4 g/dL (ref 30.0–36.0)
MCV: 94.2 fL (ref 78.0–100.0)
Monocytes Absolute: 1.1 10*3/uL — ABNORMAL HIGH (ref 0.1–1.0)
Monocytes Relative: 11 % (ref 3–12)
NEUTROS ABS: 7.3 10*3/uL (ref 1.7–7.7)
Neutrophils Relative %: 74 % (ref 43–77)
Platelets: 407 10*3/uL — ABNORMAL HIGH (ref 150–400)
RBC: 2.75 MIL/uL — ABNORMAL LOW (ref 3.87–5.11)
RDW: 18.7 % — AB (ref 11.5–15.5)
WBC Morphology: INCREASED
WBC: 9.9 10*3/uL (ref 4.0–10.5)

## 2013-08-13 LAB — ABO/RH: ABO/RH(D): O POS

## 2013-08-13 LAB — URINE MICROSCOPIC-ADD ON

## 2013-08-13 LAB — PROTIME-INR
INR: 2.2 — AB (ref 0.00–1.49)
PROTHROMBIN TIME: 23.7 s — AB (ref 11.6–15.2)

## 2013-08-13 LAB — POC OCCULT BLOOD, ED: FECAL OCCULT BLD: NEGATIVE

## 2013-08-13 MED ORDER — CARBOXYMETHYLCELLULOSE SODIUM 0.5 % OP SOLN: 1.0000 [drp] | Freq: Two times a day (BID) | OPHTHALMIC | Status: DC

## 2013-08-13 MED ORDER — ACETAMINOPHEN 650 MG RE SUPP: 650.0000 mg | Freq: Four times a day (QID) | RECTAL | Status: DC | PRN

## 2013-08-13 MED ORDER — SODIUM CHLORIDE 0.9 % IV SOLN
1.0000 g | INTRAVENOUS | Status: DC
  Administered 2013-08-13 – 2013-08-17 (×5): 1 g via INTRAVENOUS
  Filled 2013-08-13 (×7): qty 1

## 2013-08-13 MED ORDER — SODIUM CHLORIDE 0.9 % IV SOLN: INTRAVENOUS | Status: DC

## 2013-08-13 MED ORDER — CYCLOSPORINE 0.05 % OP EMUL
1.0000 [drp] | Freq: Two times a day (BID) | OPHTHALMIC | Status: DC
  Administered 2013-08-13 – 2013-08-19 (×12): 1 [drp] via OPHTHALMIC
  Filled 2013-08-13 (×14): qty 1

## 2013-08-13 MED ORDER — PIPERACILLIN-TAZOBACTAM 3.375 G IVPB 30 MIN
3.3750 g | Freq: Once | INTRAVENOUS | Status: AC
  Administered 2013-08-13: 3.375 g via INTRAVENOUS
  Filled 2013-08-13: qty 50

## 2013-08-13 MED ORDER — IOHEXOL 300 MG/ML  SOLN
75.0000 mL | Freq: Once | INTRAMUSCULAR | Status: AC | PRN
  Administered 2013-08-13: 75 mL via INTRAVENOUS

## 2013-08-13 MED ORDER — LATANOPROST 0.005 % OP SOLN
1.0000 [drp] | Freq: Every day | OPHTHALMIC | Status: DC
  Administered 2013-08-13 – 2013-08-18 (×6): 1 [drp] via OPHTHALMIC
  Filled 2013-08-13 (×2): qty 2.5

## 2013-08-13 MED ORDER — SODIUM CHLORIDE 0.9 % IV SOLN
INTRAVENOUS | Status: DC
  Administered 2013-08-14 – 2013-08-15 (×2): via INTRAVENOUS

## 2013-08-13 MED ORDER — HYDROMORPHONE HCL PF 1 MG/ML IJ SOLN: 0.5000 mg | INTRAMUSCULAR | Status: DC | PRN

## 2013-08-13 MED ORDER — ONDANSETRON HCL 4 MG PO TABS: 4.0000 mg | ORAL_TABLET | Freq: Four times a day (QID) | ORAL | Status: DC | PRN

## 2013-08-13 MED ORDER — ONDANSETRON HCL 4 MG/2ML IJ SOLN: 4.0000 mg | Freq: Three times a day (TID) | INTRAMUSCULAR | Status: DC | PRN

## 2013-08-13 MED ORDER — KETOROLAC TROMETHAMINE 0.5 % OP SOLN
1.0000 [drp] | Freq: Four times a day (QID) | OPHTHALMIC | Status: DC
  Administered 2013-08-13 – 2013-08-19 (×22): 1 [drp] via OPHTHALMIC
  Filled 2013-08-13: qty 3

## 2013-08-13 MED ORDER — ACETAMINOPHEN 325 MG PO TABS
650.0000 mg | ORAL_TABLET | Freq: Four times a day (QID) | ORAL | Status: DC | PRN
  Filled 2013-08-13: qty 2

## 2013-08-13 MED ORDER — POLYVINYL ALCOHOL 1.4 % OP SOLN
1.0000 [drp] | Freq: Two times a day (BID) | OPHTHALMIC | Status: DC
  Administered 2013-08-13 – 2013-08-19 (×12): 1 [drp] via OPHTHALMIC
  Filled 2013-08-13: qty 15

## 2013-08-13 MED ORDER — VITAMIN K1 10 MG/ML IJ SOLN
10.0000 mg | Freq: Once | INTRAMUSCULAR | Status: AC
  Administered 2013-08-13: 10 mg via SUBCUTANEOUS
  Filled 2013-08-13: qty 1

## 2013-08-13 MED ORDER — SODIUM CHLORIDE 0.9 % IV SOLN
Freq: Once | INTRAVENOUS | Status: AC
Start: 2013-08-13 — End: 2013-08-13
  Administered 2013-08-13: 15:00:00 via INTRAVENOUS

## 2013-08-13 MED ORDER — PNEUMOCOCCAL VAC POLYVALENT 25 MCG/0.5ML IJ INJ
0.5000 mL | INJECTION | INTRAMUSCULAR | Status: AC
  Administered 2013-08-15: 0.5 mL via INTRAMUSCULAR
  Filled 2013-08-13: qty 0.5

## 2013-08-13 MED ORDER — ONDANSETRON HCL 4 MG/2ML IJ SOLN: 4.0000 mg | Freq: Four times a day (QID) | INTRAMUSCULAR | Status: DC | PRN

## 2013-08-13 NOTE — ED Provider Notes (Signed)
CSN: [id number]     Arrival date & time 08/13/13  1258 History   First MD Initiated Contact with Patient 08/13/13 1331     Chief Complaint  Patient presents with  . loss of appetite      (Consider location/radiation/quality/duration/timing/severity/associated sxs/prior Treatment) HPI Comments: Patient presents with loss of appetite. She reports a two-week history of decreased appetite and inability to keep. She does report some abdominal pain which has been gone on for the last 3-4 months. She feels that it's gotten worse over the last couple of weeks. She denies any known fevers. She denies any nausea or vomiting. She denies any urinary symptoms. She is on Coumadin and she had a fall where she passed out 2 weeks ago. She hit her head and has a knot just above her left eyebrow. She denies any other injuries from the fall. She denies any neck or back pain. She denies any ongoing headaches however she does feel that her appetite has been worse since that time. She's also had some trouble with her balance since that time. She was seen by her primary care physician and had a CT scan of her abdomen and pelvis ordered which was done today and shows diverticulitis with multiple pericolonic abscesses. She was sent over here for further treatment.   Past Medical History  Diagnosis Date  . Syncope   . HTN (hypertension)   . Uterine prolapse   . Cryptococcal pneumonitis     right lower lobe  . GERD (gastroesophageal reflux disease)   . Rheumatoid arthritis(714.0)   . Choroid melanoma of left eye   . Renal cell carcinoma     Left kidney   Past Surgical History  Procedure Laterality Date  . Nephrectomy      native  . Eye surgery    . Bladder tact    . Partial removal of lung    . Tumor removed from left eye    . Cholecystectomy    . Brain meningioma excision    . Implanted loop recorder      Left Breast  . Colonoscopy N/A 05/21/2012    Procedure: COLONOSCOPY;  Surgeon: Arta Silence, MD;   Location: [facility];  Service: Endoscopy;  Laterality: N/A;   Family History  Problem Relation Age of Onset  . Heart disease Maternal Aunt    History  Substance Use Topics  . Smoking status: Never Smoker   . Smokeless tobacco: Never Used  . Alcohol Use: No   OB History   Grav Para Term Preterm Abortions TAB SAB Ect Mult Living                 Review of Systems  Constitutional: Positive for appetite change and fatigue. Negative for fever, chills and diaphoresis.  HENT: Negative for congestion, rhinorrhea and sneezing.   Eyes: Negative.   Respiratory: Negative for cough, chest tightness and shortness of breath.   Cardiovascular: Negative for chest pain and leg swelling.  Gastrointestinal: Positive for abdominal pain. Negative for nausea, vomiting, diarrhea and blood in stool.  Genitourinary: Negative for frequency, hematuria, flank pain and difficulty urinating.  Musculoskeletal: Negative for arthralgias and back pain.  Skin: Negative for rash.  Neurological: Positive for weakness (generalized). Negative for dizziness, speech difficulty, numbness and headaches.      Allergies  Review of patient's allergies indicates no known allergies.  Home Medications   Prior to Admission medications   Medication Sig Start Date End Date Taking? Authorizing Provider  bromfenac Claris Che)  0.09 % ophthalmic solution Place 1 drop into both eyes 2 (two) times daily.    Yes Historical Provider, MD  Calcium Carbonate (CALCIUM 600 PO) Take 600 mg by mouth daily.   Yes Historical Provider, MD  cycloSPORINE (RESTASIS) 0.05 % ophthalmic emulsion Place 1 drop into both eyes 2 (two) times daily.     Yes Historical Provider, MD  fenofibrate (TRICOR) 145 MG tablet Take 145 mg by mouth daily.   Yes Historical Provider, MD  hydrochlorothiazide (MICROZIDE) 12.5 MG capsule Take 12.5 mg by mouth daily.   Yes Historical Provider, MD  latanoprost (XALATAN) 0.005 % ophthalmic solution Place 1 drop into both  eyes at bedtime.   Yes Historical Provider, MD  leflunomide (ARAVA) 20 MG tablet Take 20 mg by mouth daily.   Yes Historical Provider, MD  carboxymethylcellulose (REFRESH TEARS) 0.5 % SOLN Place 1 drop into both eyes 2 (two) times daily.    Historical Provider, MD  hydrocortisone (ANUSOL-HC) 2.5 % rectal cream Place rectally 2 (two) times daily as needed for hemorrhoids. 05/30/12   Domenic Polite, MD  methylPREDNISolone (MEDROL) 4 MG tablet Take 4 mg by mouth daily.     Historical Provider, MD  Multiple Vitamin (MULTIVITAMIN) capsule Take 1 capsule by mouth daily.      Historical Provider, MD  omega-3 acid ethyl esters (LOVAZA) 1 G capsule Take 1 tablet in the morning and 1/2 tablet in the evening    Historical Provider, MD  phytonadione (MEPHYTON) 5 MG tablet Take 0.5 tablets (2.5 mg total) by mouth once. 07/16/13   Candee Furbish, MD  sulfaSALAzine (AZULFIDINE) 500 MG tablet Take 500 mg by mouth 2 (two) times daily.    Historical Provider, MD  trazodone (DESYREL) 300 MG tablet Take 300 mg by mouth at bedtime.      Historical Provider, MD  warfarin (COUMADIN) 1 MG tablet Take 1 tablet (1 mg total) by mouth as directed. 07/23/13   Candee Furbish, MD   BP 123/66  Pulse 77  Temp(Src) 98.2 F (36.8 C) (Oral)  Resp 23  Ht 5\' 5"  (1.651 m)  Wt 107 lb (48.535 kg)  BMI 17.81 kg/m2  SpO2 97% Physical Exam  Constitutional: She is oriented to person, place, and time. She appears well-developed and well-nourished.  HENT:  Head: Normocephalic.  Small hematoma above the left eyebrow  Eyes: Pupils are equal, round, and reactive to light.  Neck: Normal range of motion. Neck supple.  No pain along the spine  Cardiovascular: Normal rate, regular rhythm and normal heart sounds.   Pulmonary/Chest: Effort normal and breath sounds normal. No respiratory distress. She has no wheezes. She has no rales. She exhibits no tenderness.  Abdominal: Soft. Bowel sounds are normal. There is tenderness (Moderate diffuse abdominal  tenderness on palpation). There is no rebound and no guarding.  Musculoskeletal: Normal range of motion. She exhibits no edema.  Lymphadenopathy:    She has no cervical adenopathy.  Neurological: She is alert and oriented to person, place, and time. She has normal strength. No cranial nerve deficit or sensory deficit. GCS eye subscore is 4. GCS verbal subscore is 5. GCS motor subscore is 6.  Skin: Skin is warm and dry. No rash noted.  Psychiatric: She has a normal mood and affect.    ED Course  Procedures (including critical care time) Labs Review Labs Reviewed  CBC WITH DIFFERENTIAL - Abnormal; Notable for the following:    RBC 2.75 (*)    Hemoglobin 8.4 (*)  HCT 25.9 (*)    RDW 18.7 (*)    Platelets 407 (*)    Monocytes Absolute 1.1 (*)    All other components within normal limits  COMPREHENSIVE METABOLIC PANEL - Abnormal; Notable for the following:    Albumin 2.6 (*)    GFR calc non Af Amer 79 (*)    All other components within normal limits  URINALYSIS, ROUTINE W REFLEX MICROSCOPIC - Abnormal; Notable for the following:    Color, Urine AMBER (*)    APPearance CLOUDY (*)    Hgb urine dipstick TRACE (*)    Leukocytes, UA MODERATE (*)    All other components within normal limits  PROTIME-INR - Abnormal; Notable for the following:    Prothrombin Time 23.7 (*)    INR 2.20 (*)    All other components within normal limits  URINE MICROSCOPIC-ADD ON - Abnormal; Notable for the following:    Bacteria, UA MANY (*)    All other components within normal limits  POC OCCULT BLOOD, ED  TYPE AND SCREEN    Imaging Review Ct Head Wo Contrast  08/13/2013   CLINICAL DATA:  Previous traumatic injury, nausea  EXAM: CT HEAD WITHOUT CONTRAST  TECHNIQUE: Contiguous axial images were obtained from the base of the skull through the vertex without intravenous contrast.  COMPARISON:  None.  FINDINGS: The bony calvarium is intact. Soft tissue hematoma is noted in the left frontal region consistent  with a recent history. Changes consistent with prior left frontal craniotomy are noted. Septal malacia changes are noted secondary to the previous craniotomy also in the left frontal region. Mild atrophic changes are seen. No findings to suggest acute hemorrhage, acute infarction or space-occupying mass lesion are noted. Ex vacuo dilatation of the left lateral ventricle is noted.  IMPRESSION: Small left frontal soft tissue scalp hematoma.  Chronic changes as described.   Electronically Signed   By: Inez Catalina M.D.   On: 08/13/2013 14:57   Ct Abdomen Pelvis W Contrast  08/13/2013   CLINICAL DATA:  Abdominal pain with nausea and weight loss. History of renal cell carcinoma.  EXAM: CT ABDOMEN AND PELVIS WITH CONTRAST  TECHNIQUE: Multidetector CT imaging of the abdomen and pelvis was performed using the standard protocol following bolus administration of intravenous contrast.  CONTRAST:  75 ml Omnipaque 300.  COMPARISON:  Abdominal pelvic CT 05/13/2012.  FINDINGS: There is stable scarring at the right lung base, including a nodular component in the right lower lobe measuring 1.4 cm on image 17. There is no significant pleural or pericardial effusion.  The gallbladder is surgically absent. There is no significant biliary dilatation. The liver and spleen appear unremarkable. The pancreas is diffusely atrophied without focal abnormality.  The right kidney is surgically absent. There is no mass within the nephrectomy bed. The left kidney appears normal. Both adrenal glands appear normal.  The stomach, small bowel and proximal colon demonstrate no significant findings. There is extensive sigmoid colon diverticulitis with wall thickening and surrounding inflammation. There are multiple extraluminal air fluid collections surrounding the sigmoid colon. These measure 3.5 x 2.7 cm along the left aspect of the proximal sigmoid colon (image 74) and 4.5 x 1.9 cm more distally along the left aspect of the sigmoid colon. Within  the pelvic cul-de-sac, there is a 3.1 x 4.8 cm air-fluid collection on image 77. The contrast material has not yet passed through the sigmoid colon. The uterus and ovaries appear normal. The bladder is nearly empty, but demonstrates no  pneumatosis. A degree of pelvic floor relaxation is suspected. There is no evidence of bowel obstruction or pneumoperitoneum.  Chronic L1 compression deformity is unchanged. There are no worrisome osseous findings. Subchondral sclerosis anteriorly in the right femoral head is stable, potentially reflecting chronic avascular necrosis.  IMPRESSION: 1. Acute sigmoid diverticulitis with multiple pericolonic abscesses. 2. No evidence of free intraperitoneal air or bowel obstruction. 3. No evidence of metastatic renal cell carcinoma. The right nephrectomy bed and left kidney have stable appearances. 4. These results will be called to the ordering clinician or representative by the Radiology Department at the imaging location. Patient has been detained on site for subsequent disposition.   Electronically Signed   By: Camie Patience M.D.   On: 08/13/2013 12:36     EKG Interpretation None      MDM   Final diagnoses:  Diverticulitis of intestine with abscess    Patient diverticulitis and diverticular abscesses. I spoke with Dr. Georgette Dover who will consult on the patient. He requests the hospitalist to admit. As Dr. Conley Canal which I hospitalist to admit the patient. She is on Coumadin and she had a recent fall. Head CT did not show any evidence of traumatic injury. Hemoccult was done which was negative. Her hemoglobin is low at 8.4. Her baseline seems to be around 10. She was typed and screened for blood in case her hemoglobin drops.    Malvin Johns, MD 08/13/13 [phone]

## 2013-08-13 NOTE — Consult Note (Addendum)
Reason for Consult:Sigmoid diverticulitis with pericolonic abscess Referring Physician: Dr. Tamera Punt  PCP - Dr. Cristela Blue [facility] - Dr. Paulita Fujita Cardiology - Dr. Cristopher Peru  Ashley [name] is an 80 y.o. female.  HPI: This is an 80 yo female with known extensive sigmoid diverticulosis s/p colonoscopy in 05/2012, as well as admission for diverticular bleed during that time frame, presents with several months of vague, worsening lower abdominal pain.  Her appetite has been poor for the last two weeks.  She denies any fever, nausea, vomiting, or urinary symptoms.  The patient apparently had a fall two weeks ago after "passing out".  She is anticoagulated on Coumadin for atrial fibrillation.  An outpatient CT scan shows diverticulitis with multiple pericolonic abscesses.    She has a history of right renal cell carcinoma s/p right nephrectomy.    While in the ED, she had a large bowel movement, which was her first in several days.  She feels much better now, but still has some lower abdominal pain.  Past Medical History  Diagnosis Date  . Syncope   . HTN (hypertension)   . Uterine prolapse   . Cryptococcal pneumonitis     right lower lobe  . GERD (gastroesophageal reflux disease)   . Rheumatoid arthritis(714.0)   . Choroid melanoma of left eye   . Renal cell carcinoma     Left kidney    Past Surgical History  Procedure Laterality Date  . Nephrectomy      native  . Eye surgery    . Bladder tact    . Partial removal of lung    . Tumor removed from left eye    . Cholecystectomy    . Brain meningioma excision    . Implanted loop recorder      Left Breast  . Colonoscopy N/A 05/21/2012    Procedure: COLONOSCOPY;  Surgeon: Arta Silence, MD;  Location: [facility];  Service: Endoscopy;  Laterality: N/A;    Family History  Problem Relation Age of Onset  . Heart disease Maternal Aunt     Social History:  reports that she has never smoked. She has never used smokeless tobacco. She  reports that she does not drink alcohol or use illicit drugs.  Allergies: No Known Allergies  Medications:  Prior to Admission medications   Medication Sig Start Date End Date Taking? Authorizing Provider  bromfenac (XIBROM) 0.09 % ophthalmic solution Place 1 drop into both eyes 2 (two) times daily.    Yes Historical Provider, MD  Calcium Carbonate (CALCIUM 600 PO) Take 600 mg by mouth daily.   Yes Historical Provider, MD  cycloSPORINE (RESTASIS) 0.05 % ophthalmic emulsion Place 1 drop into both eyes 2 (two) times daily.     Yes Historical Provider, MD  fenofibrate (TRICOR) 145 MG tablet Take 145 mg by mouth daily.   Yes Historical Provider, MD  hydrochlorothiazide (MICROZIDE) 12.5 MG capsule Take 12.5 mg by mouth daily.   Yes Historical Provider, MD  latanoprost (XALATAN) 0.005 % ophthalmic solution Place 1 drop into both eyes at bedtime.   Yes Historical Provider, MD  leflunomide (ARAVA) 20 MG tablet Take 20 mg by mouth daily.   Yes Historical Provider, MD  carboxymethylcellulose (REFRESH TEARS) 0.5 % SOLN Place 1 drop into both eyes 2 (two) times daily.    Historical Provider, MD  hydrocortisone (ANUSOL-HC) 2.5 % rectal cream Place rectally 2 (two) times daily as needed for hemorrhoids. 05/30/12   Domenic Polite, MD  methylPREDNISolone (MEDROL)  4 MG tablet Take 4 mg by mouth daily.     Historical Provider, MD  Multiple Vitamin (MULTIVITAMIN) capsule Take 1 capsule by mouth daily.      Historical Provider, MD  omega-3 acid ethyl esters (LOVAZA) 1 G capsule Take 1 tablet in the morning and 1/2 tablet in the evening    Historical Provider, MD  phytonadione (MEPHYTON) 5 MG tablet Take 0.5 tablets (2.5 mg total) by mouth once. 07/16/13   Candee Furbish, MD  sulfaSALAzine (AZULFIDINE) 500 MG tablet Take 500 mg by mouth 2 (two) times daily.    Historical Provider, MD  trazodone (DESYREL) 300 MG tablet Take 300 mg by mouth at bedtime.      Historical Provider, MD  warfarin (COUMADIN) 1 MG tablet Take 1  tablet (1 mg total) by mouth as directed. 07/23/13   Candee Furbish, MD     Results for orders placed during the hospital encounter of 08/13/13 (from the past 48 hour(s))  CBC WITH DIFFERENTIAL     Status: Abnormal   Collection Time    08/13/13  2:13 PM      Result Value Ref Range   WBC 9.9  4.0 - 10.5 K/uL   RBC 2.75 (*) 3.87 - 5.11 MIL/uL   Hemoglobin 8.4 (*) 12.0 - 15.0 g/dL   HCT 25.9 (*) 36.0 - 46.0 %   MCV 94.2  78.0 - 100.0 fL   MCH 30.5  26.0 - 34.0 pg   MCHC 32.4  30.0 - 36.0 g/dL   RDW 18.7 (*) 11.5 - 15.5 %   Platelets 407 (*) 150 - 400 K/uL   Neutrophils Relative % 74  43 - 77 %   Lymphocytes Relative 15  12 - 46 %   Monocytes Relative 11  3 - 12 %   Eosinophils Relative 0  0 - 5 %   Basophils Relative 0  0 - 1 %   Neutro Abs 7.3  1.7 - 7.7 K/uL   Lymphs Abs 1.5  0.7 - 4.0 K/uL   Monocytes Absolute 1.1 (*) 0.1 - 1.0 K/uL   Eosinophils Absolute 0.0  0.0 - 0.7 K/uL   Basophils Absolute 0.0  0.0 - 0.1 K/uL   RBC Morphology POLYCHROMASIA PRESENT     WBC Morphology INCREASED BANDS (>20% BANDS)    COMPREHENSIVE METABOLIC PANEL     Status: Abnormal   Collection Time    08/13/13  2:13 PM      Result Value Ref Range   Sodium 138  137 - 147 mEq/L   Potassium 3.7  3.7 - 5.3 mEq/L   Chloride 101  96 - 112 mEq/L   CO2 22  19 - 32 mEq/L   Glucose, Bld 92  70 - 99 mg/dL   BUN 9  6 - 23 mg/dL   Creatinine, Ser 0.69  0.50 - 1.10 mg/dL   Calcium 9.8  8.4 - 10.5 mg/dL   Total Protein 7.1  6.0 - 8.3 g/dL   Albumin 2.6 (*) 3.5 - 5.2 g/dL   AST 32  0 - 37 U/L   ALT 11  0 - 35 U/L   Alkaline Phosphatase 59  39 - 117 U/L   Total Bilirubin 0.6  0.3 - 1.2 mg/dL   GFR calc non Af Amer 79 (*) >90 mL/min   GFR calc Af Amer >90  >90 mL/min   Comment: (NOTE)     The eGFR has been calculated using the CKD EPI equation.  This calculation has not been validated in all clinical situations.     eGFR's persistently <90 mL/min signify possible Chronic Kidney     Disease.  PROTIME-INR      Status: Abnormal   Collection Time    08/13/13  2:13 PM      Result Value Ref Range   Prothrombin Time 23.7 (*) 11.6 - 15.2 seconds   INR 2.20 (*) 0.00 - 1.49  URINALYSIS, ROUTINE W REFLEX MICROSCOPIC     Status: Abnormal   Collection Time    08/13/13  3:39 PM      Result Value Ref Range   Color, Urine AMBER (*) YELLOW   Comment: BIOCHEMICALS MAY BE AFFECTED BY COLOR   APPearance CLOUDY (*) CLEAR   Specific Gravity, Urine 1.005  1.005 - 1.030   pH 6.0  5.0 - 8.0   Glucose, UA NEGATIVE  NEGATIVE mg/dL   Hgb urine dipstick TRACE (*) NEGATIVE   Bilirubin Urine NEGATIVE  NEGATIVE   Ketones, ur NEGATIVE  NEGATIVE mg/dL   Protein, ur NEGATIVE  NEGATIVE mg/dL   Urobilinogen, UA 1.0  0.0 - 1.0 mg/dL   Nitrite NEGATIVE  NEGATIVE   Leukocytes, UA MODERATE (*) NEGATIVE  URINE MICROSCOPIC-ADD ON     Status: Abnormal   Collection Time    08/13/13  3:39 PM      Result Value Ref Range   Squamous Epithelial / LPF RARE  RARE   WBC, UA 3-6  <3 WBC/hpf   RBC / HPF 0-2  <3 RBC/hpf   Bacteria, UA MANY (*) RARE  POC OCCULT BLOOD, ED     Status: None   Collection Time    08/13/13  3:49 PM      Result Value Ref Range   Fecal Occult Bld NEGATIVE  NEGATIVE    Ct Head Wo Contrast  08/13/2013   CLINICAL DATA:  Previous traumatic injury, nausea  EXAM: CT HEAD WITHOUT CONTRAST  TECHNIQUE: Contiguous axial images were obtained from the base of the skull through the vertex without intravenous contrast.  COMPARISON:  None.  FINDINGS: The bony calvarium is intact. Soft tissue hematoma is noted in the left frontal region consistent with a recent history. Changes consistent with prior left frontal craniotomy are noted. Septal malacia changes are noted secondary to the previous craniotomy also in the left frontal region. Mild atrophic changes are seen. No findings to suggest acute hemorrhage, acute infarction or space-occupying mass lesion are noted. Ex vacuo dilatation of the left lateral ventricle is noted.   IMPRESSION: Small left frontal soft tissue scalp hematoma.  Chronic changes as described.   Electronically Signed   By: Inez Catalina M.D.   On: 08/13/2013 14:57   Ct Abdomen Pelvis W Contrast  08/13/2013   CLINICAL DATA:  Abdominal pain with nausea and weight loss. History of renal cell carcinoma.  EXAM: CT ABDOMEN AND PELVIS WITH CONTRAST  TECHNIQUE: Multidetector CT imaging of the abdomen and pelvis was performed using the standard protocol following bolus administration of intravenous contrast.  CONTRAST:  75 ml Omnipaque 300.  COMPARISON:  Abdominal pelvic CT 05/13/2012.  FINDINGS: There is stable scarring at the right lung base, including a nodular component in the right lower lobe measuring 1.4 cm on image 17. There is no significant pleural or pericardial effusion.  The gallbladder is surgically absent. There is no significant biliary dilatation. The liver and spleen appear unremarkable. The pancreas is diffusely atrophied without focal abnormality.  The right kidney is surgically  absent. There is no mass within the nephrectomy bed. The left kidney appears normal. Both adrenal glands appear normal.  The stomach, small bowel and proximal colon demonstrate no significant findings. There is extensive sigmoid colon diverticulitis with wall thickening and surrounding inflammation. There are multiple extraluminal air fluid collections surrounding the sigmoid colon. These measure 3.5 x 2.7 cm along the left aspect of the proximal sigmoid colon (image 74) and 4.5 x 1.9 cm more distally along the left aspect of the sigmoid colon. Within the pelvic cul-de-sac, there is a 3.1 x 4.8 cm air-fluid collection on image 77. The contrast material has not yet passed through the sigmoid colon. The uterus and ovaries appear normal. The bladder is nearly empty, but demonstrates no pneumatosis. A degree of pelvic floor relaxation is suspected. There is no evidence of bowel obstruction or pneumoperitoneum.  Chronic L1 compression  deformity is unchanged. There are no worrisome osseous findings. Subchondral sclerosis anteriorly in the right femoral head is stable, potentially reflecting chronic avascular necrosis.  IMPRESSION: 1. Acute sigmoid diverticulitis with multiple pericolonic abscesses. 2. No evidence of free intraperitoneal air or bowel obstruction. 3. No evidence of metastatic renal cell carcinoma. The right nephrectomy bed and left kidney have stable appearances. 4. These results will be called to the ordering clinician or representative by the Radiology Department at the imaging location. Patient has been detained on site for subsequent disposition.   Electronically Signed   By: Camie Patience M.D.   On: 08/13/2013 12:36    Review of Systems  Constitutional: Positive for weight loss and malaise/fatigue.  HENT: Negative for ear discharge, ear pain, hearing loss and tinnitus.   Eyes: Negative for blurred vision, double vision, photophobia and pain.  Respiratory: Negative for cough, sputum production and shortness of breath.   Cardiovascular: Negative for chest pain.  Gastrointestinal: Positive for abdominal pain. Negative for nausea and vomiting.  Genitourinary: Negative for dysuria, urgency, frequency and flank pain.  Musculoskeletal: Negative for back pain, falls, joint pain, myalgias and neck pain.  Neurological: Positive for weakness. Negative for dizziness, tingling, sensory change, focal weakness, loss of consciousness and headaches.  Endo/Heme/Allergies: Does not bruise/bleed easily.  Psychiatric/Behavioral: Negative for depression, memory loss and substance abuse. The patient is not nervous/anxious.    Blood pressure 123/66, pulse 77, temperature 98.2 F (36.8 C), temperature source Oral, resp. rate 23, height 5' 5"  (1.651 m), weight 107 lb (48.535 kg), SpO2 97.00%. Physical Exam WDWN in NAD HEENT:  EOMI, sclera anicteric Neck:  No masses, no thyromegaly Lungs:  CTA bilaterally; normal respiratory  effort CV:  Regular rate and rhythm; no murmurs Abd:  +bowel sounds, soft, mild abdominal tenderness across entire lower abdomen; no rebound, no guarding, no palpable masses Ext:  Well-perfused; no edema Skin:  Warm, dry; no sign of jaundice  Assessment/Plan: 1.  Sigmoid diverticulitis with several areas of extraluminal air fluid collections. 2.  Multiple medical comorbidities 3.  Anticoagulation 4.  Anemia - chronic vs. Slow lower GI bleed 5.  ?urinary tract infection  Recs:   Admission by [facility] to manage her multiple medical/ cardiac issues. NPO x ice chips Hold anticoagulation - would reverse anticoagulation with Vitamin K Transfuse if tachycardic Patient does not have an acute indication to proceed to the OR - afebrile, normal WBC.  With her medical history, she is at elevated risk for perioperative complications with an urgent/ emergent surgery.  If she needed urgent surgery, she would require a descending colostomy with her sigmoid colectomy (Hartmann's procedure).  The colostomy would likely be temporary if she recovered well from surgery. Will start her on Invanz for complex intra-abdominal infection. When INR below 1.5, would ask IR to percutaneously drain the fluid collection in the pelvis, as well as the largest collection adjacent to the sigmoid colon. We will continue to follow.   Imogene Burn. Georgette Dover, MD, [facility]  General/ Trauma Surgery  08/13/2013 5:31 PM

## 2013-08-13 NOTE — H&P (Signed)
[facility] History and Physical  CHANNON BATCHO [id number] DOB: 09/15/1928 DOA: 08/13/2013  Referring physician: Tamera Punt PCP: Leonides Sake, MD   Chief Complaint: andominal pain  HPI: AZELYN WINKER is a 80 y.o. female  Sent to ED after getting an outpatient CT which showed diverticulitis and multiple abscesses.  Has had abdominal pain, anorexia for months. Has lost almost 40 pounds.  Had colonoscopy last year.  Has been very weak and passed out within the past few weeks.  On coumadin for atrial fibrillation.  Has been seen by Dr. Georgette Dover who has consulted IR for drainage of abscesses. INR 2.4 today. hgb 8. Stool is heme negative. Denies bleeding. hgb 10 a year ago.   Review of Systems:  Systems reviewed. As above. Otherwise negative.  Past Medical History  Diagnosis Date  . Syncope   . Uterine prolapse   . Cryptococcal pneumonitis     right lower lobe  . GERD (gastroesophageal reflux disease)   . Asthma   . [id number])     "monthly" (08/13/2013)  . Rheumatoid arthritis(714.0)   . Arthritis     "all over" (08/13/2013)  . Choroid melanoma of left eye   . Renal cell carcinoma     Left kidney   Past Surgical History  Procedure Laterality Date  . Nephrectomy Left     native  . Bladder suspension    . Lung removal, partial    . Cholecystectomy    . Brain meningioma excision    . Loop recorder implant      Left Breast  . Colonoscopy N/A 05/21/2012    Procedure: COLONOSCOPY;  Surgeon: Arta Silence, MD;  Location: [facility];  Service: Endoscopy;  Laterality: N/A;  . Tonsillectomy    . Eye surgery Left     Choroid melanoma   Social History:  reports that she has never smoked. She has never used smokeless tobacco. She reports that she does not drink alcohol or use illicit drugs.  No Known Allergies  Family History  Problem Relation Age of Onset  . Heart disease Maternal Aunt      Prior to Admission medications   Medication Sig Start Date End Date Taking?  Authorizing Provider  bromfenac (XIBROM) 0.09 % ophthalmic solution Place 1 drop into both eyes 2 (two) times daily.    Yes Historical Provider, MD  Calcium Carbonate (CALCIUM 600 PO) Take 600 mg by mouth daily.   Yes Historical Provider, MD  cycloSPORINE (RESTASIS) 0.05 % ophthalmic emulsion Place 1 drop into both eyes 2 (two) times daily.     Yes Historical Provider, MD  fenofibrate (TRICOR) 145 MG tablet Take 145 mg by mouth daily.   Yes Historical Provider, MD  hydrochlorothiazide (MICROZIDE) 12.5 MG capsule Take 12.5 mg by mouth daily.   Yes Historical Provider, MD  latanoprost (XALATAN) 0.005 % ophthalmic solution Place 1 drop into both eyes at bedtime.   Yes Historical Provider, MD  leflunomide (ARAVA) 20 MG tablet Take 20 mg by mouth daily.   Yes Historical Provider, MD  carboxymethylcellulose (REFRESH TEARS) 0.5 % SOLN Place 1 drop into both eyes 2 (two) times daily.    Historical Provider, MD  hydrocortisone (ANUSOL-HC) 2.5 % rectal cream Place rectally 2 (two) times daily as needed for hemorrhoids. 05/30/12   Domenic Polite, MD  methylPREDNISolone (MEDROL) 4 MG tablet Take 4 mg by mouth daily.     Historical Provider, MD  Multiple Vitamin (MULTIVITAMIN) capsule Take 1 capsule by mouth daily.  Historical Provider, MD  omega-3 acid ethyl esters (LOVAZA) 1 G capsule Take 1 tablet in the morning and 1/2 tablet in the evening    Historical Provider, MD  phytonadione (MEPHYTON) 5 MG tablet Take 0.5 tablets (2.5 mg total) by mouth once. 07/16/13   Candee Furbish, MD  sulfaSALAzine (AZULFIDINE) 500 MG tablet Take 500 mg by mouth 2 (two) times daily.    Historical Provider, MD  trazodone (DESYREL) 300 MG tablet Take 300 mg by mouth at bedtime.      Historical Provider, MD  warfarin (COUMADIN) 1 MG tablet Take 1 tablet (1 mg total) by mouth as directed. 07/23/13   Candee Furbish, MD   Physical Exam: Filed Vitals:   08/13/13 1823  BP: 125/75  Pulse: 76  Temp: 98.4 F (36.9 C)  Resp: 20    BP  125/75  Pulse 76  Temp(Src) 98.4 F (36.9 C) (Oral)  Resp 20  Ht 5\' 5"  (1.651 m)  Wt 48.308 kg (106 lb 8 oz)  BMI 17.72 kg/m2  SpO2 99% BP 106/56  Pulse 60  Temp(Src) 98.6 F (37 C) (Oral)  Resp 18  Ht 5\' 5"  (1.651 m)  Wt 48.308 kg (106 lb 8 oz)  BMI 17.72 kg/m2  SpO2 96%  General Appearance:    Frail black female. Alert and oriented  Head:    Bitemporal wasting  Eyes:    PERRL, conjunctiva/corneas clear, EOM's intact, fundi    benign, both eyes     Nose:   Nares normal, septum midline, mucosa normal, no drainage    or sinus tenderness  Throat:   Lips, mucosa, and tongue normal; teeth and gums normal  Neck:   Supple, symmetrical, trachea midline, no adenopathy;    thyroid:  no enlargement/tenderness/nodules; no carotid   bruit or JVD  Back:     Symmetric, no curvature, ROM normal, no CVA tenderness  Lungs:     Clear to auscultation bilaterally, respirations unlabored  Chest Wall:    No tenderness or deformity   Heart:    Regular rate and rhythm, S1 and S2 normal, no murmur, rub   or gallop     Abdomen:     Soft, mild lower abdominal tenderness. Bowel sounds present  Genitalia:   deferred  Rectal:   deferred  Extremities:   Extremities normal, atraumatic, no cyanosis or edema  Pulses:   2+ and symmetric all extremities  Skin:   Skin color, texture, turgor normal, no rashes or lesions  Lymph nodes:   Cervical, supraclavicular, and axillary nodes normal  Neurologic:   CNII-XII intact, normal strength, sensation and reflexes    throughout           Psych: normal affect Labs on Admission:  Basic Metabolic Panel:  Recent Labs Lab 08/13/13 1413  NA 138  K 3.7  CL 101  CO2 22  GLUCOSE 92  BUN 9  CREATININE 0.69  CALCIUM 9.8   Liver Function Tests:  Recent Labs Lab 08/13/13 1413  AST 32  ALT 11  ALKPHOS 59  BILITOT 0.6  PROT 7.1  ALBUMIN 2.6*   No results found for this basename: LIPASE, AMYLASE,  in the last 168 hours No results found for this  basename: AMMONIA,  in the last 168 hours CBC:  Recent Labs Lab 08/13/13 1413  WBC 9.9  NEUTROABS 7.3  HGB 8.4*  HCT 25.9*  MCV 94.2  PLT 407*   Cardiac Enzymes: No results found for this basename: CKTOTAL, CKMB, CKMBINDEX,  TROPONINI,  in the last 168 hours  BNP (last 3 results) No results found for this basename: PROBNP,  in the last 8760 hours CBG: No results found for this basename: GLUCAP,  in the last 168 hours  Radiological Exams on Admission: Ct Head Wo Contrast  08/13/2013   CLINICAL DATA:  Previous traumatic injury, nausea  EXAM: CT HEAD WITHOUT CONTRAST  TECHNIQUE: Contiguous axial images were obtained from the base of the skull through the vertex without intravenous contrast.  COMPARISON:  None.  FINDINGS: The bony calvarium is intact. Soft tissue hematoma is noted in the left frontal region consistent with a recent history. Changes consistent with prior left frontal craniotomy are noted. Septal malacia changes are noted secondary to the previous craniotomy also in the left frontal region. Mild atrophic changes are seen. No findings to suggest acute hemorrhage, acute infarction or space-occupying mass lesion are noted. Ex vacuo dilatation of the left lateral ventricle is noted.  IMPRESSION: Small left frontal soft tissue scalp hematoma.  Chronic changes as described.   Electronically Signed   By: Inez Catalina M.D.   On: 08/13/2013 14:57   Ct Abdomen Pelvis W Contrast  08/13/2013   CLINICAL DATA:  Abdominal pain with nausea and weight loss. History of renal cell carcinoma.  EXAM: CT ABDOMEN AND PELVIS WITH CONTRAST  TECHNIQUE: Multidetector CT imaging of the abdomen and pelvis was performed using the standard protocol following bolus administration of intravenous contrast.  CONTRAST:  75 ml Omnipaque 300.  COMPARISON:  Abdominal pelvic CT 05/13/2012.  FINDINGS: There is stable scarring at the right lung base, including a nodular component in the right lower lobe measuring 1.4 cm  on image 17. There is no significant pleural or pericardial effusion.  The gallbladder is surgically absent. There is no significant biliary dilatation. The liver and spleen appear unremarkable. The pancreas is diffusely atrophied without focal abnormality.  The right kidney is surgically absent. There is no mass within the nephrectomy bed. The left kidney appears normal. Both adrenal glands appear normal.  The stomach, small bowel and proximal colon demonstrate no significant findings. There is extensive sigmoid colon diverticulitis with wall thickening and surrounding inflammation. There are multiple extraluminal air fluid collections surrounding the sigmoid colon. These measure 3.5 x 2.7 cm along the left aspect of the proximal sigmoid colon (image 74) and 4.5 x 1.9 cm more distally along the left aspect of the sigmoid colon. Within the pelvic cul-de-sac, there is a 3.1 x 4.8 cm air-fluid collection on image 77. The contrast material has not yet passed through the sigmoid colon. The uterus and ovaries appear normal. The bladder is nearly empty, but demonstrates no pneumatosis. A degree of pelvic floor relaxation is suspected. There is no evidence of bowel obstruction or pneumoperitoneum.  Chronic L1 compression deformity is unchanged. There are no worrisome osseous findings. Subchondral sclerosis anteriorly in the right femoral head is stable, potentially reflecting chronic avascular necrosis.  IMPRESSION: 1. Acute sigmoid diverticulitis with multiple pericolonic abscesses. 2. No evidence of free intraperitoneal air or bowel obstruction. 3. No evidence of metastatic renal cell carcinoma. The right nephrectomy bed and left kidney have stable appearances. 4. These results will be called to the ordering clinician or representative by the Radiology Department at the imaging location. Patient has been detained on site for subsequent disposition.   Electronically Signed   By: Camie Patience M.D.   On: 08/13/2013 12:36      Assessment/Plan  Diverticulitis of large intestine with abscess  without bleeding: started on invanz, bowel rest. Reverse coumadin. IR consult for drainage Active Problems:   PAF: reverse coumadin. Sounds like sinus rhythm on exam. Get PT eval. If fall risk, may need to stop indefinately   GERD (gastroesophageal reflux disease)   Rheumatoid arthritis(714.0): hold meds for now   Renal cell carcinoma s/p right nephrectomy     Anemia: no bleeding. Check anemia panel   UTI (urinary tract infection): Invanz will cover   Protein-calorie malnutrition, severe  Code Status: full Family Communication: none available Disposition Plan: ?  Time spent: 109 min  Delfina Redwood, MD [facility] Pager [phone]

## 2013-08-13 NOTE — Progress Notes (Signed)
Arrived to room 5w30 from ED. Denies nausea/pain at this time, instructed pt on call light and room surroundings.

## 2013-08-13 NOTE — ED Notes (Signed)
Pt c/o loss appetite for couple weeks with nausea denies emesis or diarrhea states recently had hemorrhoids and blood in stool c/o abd pain for"weeks"

## 2013-08-13 NOTE — Progress Notes (Signed)
Called ED and received report at this time from [facility]

## 2013-08-13 NOTE — ED Notes (Signed)
Patient visualized getting out of bed attached to wires with feces on floor, bed, gown and hands. Patient cleaned and assisted to bathroom for second BM. House keeping cleaned room and patient bed and linen cleaned. Patient laying comfortably in bed with call bell and instructions to inform staff for need for BM or leaving room. Patient window open and in view of nursing station.

## 2013-08-13 NOTE — ED Notes (Signed)
Dr. Molli Posey is at bedside.

## 2013-08-14 DIAGNOSIS — K630 Abscess of intestine: Secondary | ICD-10-CM | POA: Diagnosis not present

## 2013-08-14 DIAGNOSIS — K5732 Diverticulitis of large intestine without perforation or abscess without bleeding: Secondary | ICD-10-CM | POA: Diagnosis not present

## 2013-08-14 LAB — IRON AND TIBC
IRON: 46 ug/dL (ref 42–135)
SATURATION RATIOS: 21 % (ref 20–55)
TIBC: 220 ug/dL — ABNORMAL LOW (ref 250–470)
UIBC: 174 ug/dL (ref 125–400)

## 2013-08-14 LAB — VITAMIN B12: Vitamin B-12: 489 pg/mL (ref 211–911)

## 2013-08-14 LAB — FERRITIN: Ferritin: 654 ng/mL — ABNORMAL HIGH (ref 10–291)

## 2013-08-14 LAB — CBC
HEMATOCRIT: 23 % — AB (ref 36.0–46.0)
HEMOGLOBIN: 7.5 g/dL — AB (ref 12.0–15.0)
MCH: 31.1 pg (ref 26.0–34.0)
MCHC: 32.6 g/dL (ref 30.0–36.0)
MCV: 95.4 fL (ref 78.0–100.0)
Platelets: 420 10*3/uL — ABNORMAL HIGH (ref 150–400)
RBC: 2.41 MIL/uL — AB (ref 3.87–5.11)
RDW: 19.1 % — ABNORMAL HIGH (ref 11.5–15.5)
WBC: 8.1 10*3/uL (ref 4.0–10.5)

## 2013-08-14 LAB — PROTIME-INR
INR: 1.8 — ABNORMAL HIGH (ref 0.00–1.49)
Prothrombin Time: 20.4 seconds — ABNORMAL HIGH (ref 11.6–15.2)

## 2013-08-14 LAB — FOLATE: FOLATE: 12.5 ng/mL

## 2013-08-14 MED ORDER — PANTOPRAZOLE SODIUM 40 MG IV SOLR
40.0000 mg | INTRAVENOUS | Status: DC
  Administered 2013-08-14 – 2013-08-15 (×2): 40 mg via INTRAVENOUS
  Filled 2013-08-14 (×3): qty 40

## 2013-08-14 MED ORDER — POLYETHYLENE GLYCOL 3350 17 G PO PACK
17.0000 g | PACK | Freq: Every day | ORAL | Status: DC
  Administered 2013-08-14 – 2013-08-16 (×3): 17 g via ORAL
  Filled 2013-08-14 (×3): qty 1

## 2013-08-14 MED ORDER — VITAMIN K1 10 MG/ML IJ SOLN
10.0000 mg | Freq: Once | INTRAMUSCULAR | Status: AC
  Administered 2013-08-14: 10 mg via SUBCUTANEOUS
  Filled 2013-08-14: qty 1

## 2013-08-14 NOTE — Progress Notes (Signed)
Patient ID: Ashley [name], female   DOB: 1928-03-28, 80 y.o.   MRN: [id number]  Subjective: No n/v/d.  WBC normal.  Afebrile.  NPO.  NAD.  Sitting up in a chair.    Objective:  Vital signs:  Filed Vitals:   08/13/13 1730 08/13/13 1823 08/13/13 2148 08/14/13 0434  BP: 123/73 125/75 106/56 100/60  Pulse: 80 76 60 71  Temp:  98.4 F (36.9 C) 98.6 F (37 C) 98.2 F (36.8 C)  TempSrc:  Oral Oral Oral  Resp:  _0 Height:  _1  (1.651 m)    Weight:  106 lb 8 oz (48.308 kg)    SpO2: 99% 99% 96% 98%    Last BM Date: 08/13/13  Intake/Output   Yesterday:    This shift:     Physical Exam: General: Pt awake/alert/oriented x4 in no acute distress Abdomen: +bs. Soft.  Nondistended.  TTP LLQ.   No evidence of peritonitis.  No incarcerated hernias.    Problem List:   Active Problems:   Atrial fibrillation   GERD (gastroesophageal reflux disease)   Rheumatoid arthritis(714.0)   Renal cell carcinoma   Diverticulitis   Diverticulitis of large intestine with abscess without bleeding   Anemia   UTI (urinary tract infection)   Protein-calorie malnutrition, severe    Results:   Labs: Results for orders placed during the hospital encounter of 08/13/13 (from the past 48 hour(s))  CBC WITH DIFFERENTIAL     Status: Abnormal   Collection Time    08/13/13  2:13 PM      Result Value Ref Range   WBC 9.9  4.0 - 10.5 K/uL   RBC 2.75 (*) 3.87 - 5.11 MIL/uL   Hemoglobin 8.4 (*) 12.0 - 15.0 g/dL   HCT 25.9 (*) 36.0 - 46.0 %   MCV 94.2  78.0 - 100.0 fL   MCH 30.5  26.0 - 34.0 pg   MCHC 32.4  30.0 - 36.0 g/dL   RDW 18.7 (*) 11.5 - 15.5 %   Platelets 407 (*) 150 - 400 K/uL   Neutrophils Relative % 74  43 - 77 %   Lymphocytes Relative 15  12 - 46 %   Monocytes Relative 11  3 - 12 %   Eosinophils Relative 0  0 - 5 %   Basophils Relative 0  0 - 1 %   Neutro Abs 7.3  1.7 - 7.7 K/uL   Lymphs Abs 1.5  0.7 - 4.0 K/uL   Monocytes Absolute 1.1 (*) 0.1 - 1.0 K/uL   Eosinophils  Absolute 0.0  0.0 - 0.7 K/uL   Basophils Absolute 0.0  0.0 - 0.1 K/uL   RBC Morphology POLYCHROMASIA PRESENT     WBC Morphology INCREASED BANDS (>20% BANDS)    COMPREHENSIVE METABOLIC PANEL     Status: Abnormal   Collection Time    08/13/13  2:13 PM      Result Value Ref Range   Sodium 138  137 - 147 mEq/L   Potassium 3.7  3.7 - 5.3 mEq/L   Chloride 101  96 - 112 mEq/L   CO2 22  19 - 32 mEq/L   Glucose, Bld 92  70 - 99 mg/dL   BUN 9  6 - 23 mg/dL   Creatinine, Ser 0.69  0.50 - 1.10 mg/dL   Calcium 9.8  8.4 - 10.5 mg/dL   Total Protein 7.1  6.0 - 8.3 g/dL   Albumin 2.6 (*) 3.5 - 5.2 g/dL  AST 32  0 - 37 U/L   ALT 11  0 - 35 U/L   Alkaline Phosphatase 59  39 - 117 U/L   Total Bilirubin 0.6  0.3 - 1.2 mg/dL   GFR calc non Af Amer 79 (*) >90 mL/min   GFR calc Af Amer >90  >90 mL/min   Comment: (NOTE)     The eGFR has been calculated using the CKD EPI equation.     This calculation has not been validated in all clinical situations.     eGFR's persistently <90 mL/min signify possible Chronic Kidney     Disease.  PROTIME-INR     Status: Abnormal   Collection Time    08/13/13  2:13 PM      Result Value Ref Range   Prothrombin Time 23.7 (*) 11.6 - 15.2 seconds   INR 2.20 (*) 0.00 - 1.49  URINALYSIS, ROUTINE W REFLEX MICROSCOPIC     Status: Abnormal   Collection Time    08/13/13  3:39 PM      Result Value Ref Range   Color, Urine AMBER (*) YELLOW   Comment: BIOCHEMICALS MAY BE AFFECTED BY COLOR   APPearance CLOUDY (*) CLEAR   Specific Gravity, Urine 1.005  1.005 - 1.030   pH 6.0  5.0 - 8.0   Glucose, UA NEGATIVE  NEGATIVE mg/dL   Hgb urine dipstick TRACE (*) NEGATIVE   Bilirubin Urine NEGATIVE  NEGATIVE   Ketones, ur NEGATIVE  NEGATIVE mg/dL   Protein, ur NEGATIVE  NEGATIVE mg/dL   Urobilinogen, UA 1.0  0.0 - 1.0 mg/dL   Nitrite NEGATIVE  NEGATIVE   Leukocytes, UA MODERATE (*) NEGATIVE  URINE MICROSCOPIC-ADD ON     Status: Abnormal   Collection Time    08/13/13  3:39 PM       Result Value Ref Range   Squamous Epithelial / LPF RARE  RARE   WBC, UA 3-6  <3 WBC/hpf   RBC / HPF 0-2  <3 RBC/hpf   Bacteria, UA MANY (*) RARE  POC OCCULT BLOOD, ED     Status: None   Collection Time    08/13/13  3:49 PM      Result Value Ref Range   Fecal Occult Bld NEGATIVE  NEGATIVE  TYPE AND SCREEN     Status: None   Collection Time    08/13/13  5:46 PM      Result Value Ref Range   ABO/RH(D) O POS     Antibody Screen NEG     Sample Expiration 08/16/2013    ABO/RH     Status: None   Collection Time    08/13/13  5:46 PM      Result Value Ref Range   ABO/RH(D) O POS    CBC     Status: Abnormal   Collection Time    08/14/13  8:06 AM      Result Value Ref Range   WBC 8.1  4.0 - 10.5 K/uL   RBC 2.41 (*) 3.87 - 5.11 MIL/uL   Hemoglobin 7.5 (*) 12.0 - 15.0 g/dL   HCT 23.0 (*) 36.0 - 46.0 %   MCV 95.4  78.0 - 100.0 fL   MCH 31.1  26.0 - 34.0 pg   MCHC 32.6  30.0 - 36.0 g/dL   RDW 19.1 (*) 11.5 - 15.5 %   Platelets 420 (*) 150 - 400 K/uL  PROTIME-INR     Status: Abnormal   Collection Time    08/14/13  8:06 AM      Result Value Ref Range   Prothrombin Time 20.4 (*) 11.6 - 15.2 seconds   INR 1.80 (*) 0.00 - 1.49    Imaging / Studies: Ct Head Wo Contrast  08/13/2013   CLINICAL DATA:  Previous traumatic injury, nausea  EXAM: CT HEAD WITHOUT CONTRAST  TECHNIQUE: Contiguous axial images were obtained from the base of the skull through the vertex without intravenous contrast.  COMPARISON:  None.  FINDINGS: The bony calvarium is intact. Soft tissue hematoma is noted in the left frontal region consistent with a recent history. Changes consistent with prior left frontal craniotomy are noted. Septal malacia changes are noted secondary to the previous craniotomy also in the left frontal region. Mild atrophic changes are seen. No findings to suggest acute hemorrhage, acute infarction or space-occupying mass lesion are noted. Ex vacuo dilatation of the left lateral ventricle is  noted.  IMPRESSION: Small left frontal soft tissue scalp hematoma.  Chronic changes as described.   Electronically Signed   By: Inez Catalina M.D.   On: 08/13/2013 14:57   Ct Abdomen Pelvis W Contrast  08/13/2013   CLINICAL DATA:  Abdominal pain with nausea and weight loss. History of renal cell carcinoma.  EXAM: CT ABDOMEN AND PELVIS WITH CONTRAST  TECHNIQUE: Multidetector CT imaging of the abdomen and pelvis was performed using the standard protocol following bolus administration of intravenous contrast.  CONTRAST:  75 ml Omnipaque 300.  COMPARISON:  Abdominal pelvic CT 05/13/2012.  FINDINGS: There is stable scarring at the right lung base, including a nodular component in the right lower lobe measuring 1.4 cm on image 17. There is no significant pleural or pericardial effusion.  The gallbladder is surgically absent. There is no significant biliary dilatation. The liver and spleen appear unremarkable. The pancreas is diffusely atrophied without focal abnormality.  The right kidney is surgically absent. There is no mass within the nephrectomy bed. The left kidney appears normal. Both adrenal glands appear normal.  The stomach, small bowel and proximal colon demonstrate no significant findings. There is extensive sigmoid colon diverticulitis with wall thickening and surrounding inflammation. There are multiple extraluminal air fluid collections surrounding the sigmoid colon. These measure 3.5 x 2.7 cm along the left aspect of the proximal sigmoid colon (image 74) and 4.5 x 1.9 cm more distally along the left aspect of the sigmoid colon. Within the pelvic cul-de-sac, there is a 3.1 x 4.8 cm air-fluid collection on image 77. The contrast material has not yet passed through the sigmoid colon. The uterus and ovaries appear normal. The bladder is nearly empty, but demonstrates no pneumatosis. A degree of pelvic floor relaxation is suspected. There is no evidence of bowel obstruction or pneumoperitoneum.  Chronic L1  compression deformity is unchanged. There are no worrisome osseous findings. Subchondral sclerosis anteriorly in the right femoral head is stable, potentially reflecting chronic avascular necrosis.  IMPRESSION: 1. Acute sigmoid diverticulitis with multiple pericolonic abscesses. 2. No evidence of free intraperitoneal air or bowel obstruction. 3. No evidence of metastatic renal cell carcinoma. The right nephrectomy bed and left kidney have stable appearances. 4. These results will be called to the ordering clinician or representative by the Radiology Department at the imaging location. Patient has been detained on site for subsequent disposition.   Electronically Signed   By: Camie Patience M.D.   On: 08/13/2013 12:36   Scheduled Meds: . cycloSPORINE  1 drop Both Eyes BID  . ertapenem  1 g Intravenous Q24H  .  ketorolac  1 drop Both Eyes QID  . latanoprost  1 drop Both Eyes QHS  . pneumococcal 23 valent vaccine  0.5 mL Intramuscular Tomorrow-1000  . polyvinyl alcohol  1 drop Both Eyes BID   Continuous Infusions: . sodium chloride 125 mL/hr at 08/13/13 1837   PRN Meds:.acetaminophen, acetaminophen, HYDROmorphone (DILAUDID) injection, ondansetron (ZOFRAN) IV, ondansetron   Antibiotics: Anti-infectives   Start     Dose/Rate Route Frequency Ordered Stop   08/13/13 2200  ertapenem (INVANZ) 1 g in sodium chloride 0.9 % 50 mL IVPB     1 g 100 mL/hr over 30 Minutes Intravenous Every 24 hours 08/13/13 1736     08/13/13 1430  piperacillin-tazobactam (ZOSYN) IVPB 3.375 g     3.375 g 100 mL/hr over 30 Minutes Intravenous  Once 08/13/13 1415 08/13/13 1611      Assessment/Plan 1. Sigmoid diverticulitis with several areas of extraluminal air fluid collections.  2. Multiple medical comorbidities  3. Anticoagulation  4. Anemia - chronic vs. Slow lower GI bleed  5. ?urinary tract infection 6. Hx renal cell CA s/p right nephrectomy 7. PCM  IR for perc drain placement(INR 1.8) Continue with  Invanz NPO IVF Mobilize Monitor white count Hopefully we can treat her with non operative management in the acute phase Will follow  Erby Pian, [facility] Pager [phone] Office [phone]  08/14/2013 9:36 AM

## 2013-08-14 NOTE — Consult Note (Signed)
Reason for Consult:Intra-abdominal and pelvic abscesses Consulting Radiologist: Pascal Lux Referring Physician: Georgette Dover   HPI: Ashley [name] is an 80 y.o. female who has had vague abdominal pain and anorexia for the past several weeks. Her abdominal has worsened some and she eventually got CT abd/pelvis to evaluate. This finds evidence of diverticulitis with several fluid collections concerning for abscess. Surgery team has consulted and requests percutaneous drainage. She is on chronic Coumadin for a.fib, this has been held. PMHx, meds, labs reviewed. Imaging reviewed by Dr. Pascal Lux  Past Medical History:  Past Medical History  Diagnosis Date  . Syncope   . Uterine prolapse   . Cryptococcal pneumonitis     right lower lobe  . GERD (gastroesophageal reflux disease)   . Asthma   . YKDXIPJA(250.5)     "monthly" (08/13/2013)  . Rheumatoid arthritis(714.0)   . Arthritis     "all over" (08/13/2013)  . Choroid melanoma of left eye   . Renal cell carcinoma     Left kidney    Surgical History:  Past Surgical History  Procedure Laterality Date  . Nephrectomy Left     native  . Bladder suspension    . Lung removal, partial    . Cholecystectomy    . Brain meningioma excision    . Loop recorder implant      Left Breast  . Colonoscopy N/A 05/21/2012    Procedure: COLONOSCOPY;  Surgeon: Arta Silence, MD;  Location: [facility];  Service: Endoscopy;  Laterality: N/A;  . Tonsillectomy    . Eye surgery Left     Choroid melanoma    Family History:  Family History  Problem Relation Age of Onset  . Heart disease Maternal Aunt     Social History:  reports that she has never smoked. She has never used smokeless tobacco. She reports that she does not drink alcohol or use illicit drugs.  Allergies: No Known Allergies  Medications: Current facility-administered medications:0.9 %  sodium chloride infusion, , Intravenous, Continuous, Melton Alar, PA-C, Last Rate: 125 mL/hr at 08/13/13 1837;   acetaminophen (TYLENOL) suppository 650 mg, 650 mg, Rectal, Q6H PRN, Delfina Redwood, MD;  acetaminophen (TYLENOL) tablet 650 mg, 650 mg, Oral, Q6H PRN, Delfina Redwood, MD cycloSPORINE (RESTASIS) 0.05 % ophthalmic emulsion 1 drop, 1 drop, Both Eyes, BID, Delfina Redwood, MD, 1 drop at 08/14/13 0952;  ertapenem [facility]) 1 g in sodium chloride 0.9 % 50 mL IVPB, 1 g, Intravenous, Q24H, Matthew K. Tsuei, MD, 1 g at 08/13/13 2240;  HYDROmorphone (DILAUDID) injection 0.5 mg, 0.5 mg, Intravenous, Q3H PRN, Delfina Redwood, MD ketorolac (ACULAR) 0.5 % ophthalmic solution 1 drop, 1 drop, Both Eyes, QID, Delfina Redwood, MD, 1 drop at 08/14/13 0951;  latanoprost (XALATAN) 0.005 % ophthalmic solution 1 drop, 1 drop, Both Eyes, QHS, Delfina Redwood, MD, 1 drop at 08/13/13 2241;  ondansetron (ZOFRAN) injection 4 mg, 4 mg, Intravenous, Q6H PRN, Delfina Redwood, MD;  ondansetron (ZOFRAN) tablet 4 mg, 4 mg, Oral, Q6H PRN, Delfina Redwood, MD pantoprazole (PROTONIX) injection 40 mg, 40 mg, Intravenous, Q24H, Marianne L York, PA-C;  pneumococcal 23 valent vaccine (PNU-IMMUNE) injection 0.5 mL, 0.5 mL, Intramuscular, Tomorrow-1000, Delfina Redwood, MD;  polyethylene glycol (MIRALAX / GLYCOLAX) packet 17 g, 17 g, Oral, Daily, Bobby Rumpf York, PA-C polyvinyl alcohol (LIQUIFILM TEARS) 1.4 % ophthalmic solution 1 drop, 1 drop, Both Eyes, BID, Delfina Redwood, MD, 1 drop at 08/14/13 0952  ROS: See HPI for pertinent  findings, otherwise complete 10 system review negative.  Physical Exam: Blood pressure 100/60, pulse 71, temperature 98.2 F (36.8 C), temperature source Oral, resp. rate 12, height $RemoveBe'5\' 5"'[id number]$  (1.651 m), weight 106 lb 8 oz (48.308 kg), SpO2 98.00%. Gen: thin elderly AA female, NAD, nontoxic appearing ENT: unremarkable airway Lungs: CTA without w/r/r Heart: Regular Abdomen: soft, ND, mildly tender across lower abd    Labs: CBC  Recent Labs  08/13/13 1413 08/14/13 0806  WBC 9.9  8.1  HGB 8.4* 7.5*  HCT 25.9* 23.0*  PLT 407* 420*   MET  Recent Labs  08/13/13 1413  NA 138  K 3.7  CL 101  CO2 22  GLUCOSE 92  BUN 9  CREATININE 0.69  CALCIUM 9.8    Recent Labs  08/13/13 1413  PROT 7.1  ALBUMIN 2.6*  AST 32  ALT 11  ALKPHOS 59  BILITOT 0.6   PT/INR  Recent Labs  08/13/13 1413 08/14/13 0806  LABPROT 23.7* 20.4*  INR 2.20* 1.80*   ABG No results found for this basename: PHART, PCO2, PO2, HCO3,  in the last 72 hours    Ct Head Wo Contrast  08/13/2013   CLINICAL DATA:  Previous traumatic injury, nausea  EXAM: CT HEAD WITHOUT CONTRAST  TECHNIQUE: Contiguous axial images were obtained from the base of the skull through the vertex without intravenous contrast.  COMPARISON:  None.  FINDINGS: The bony calvarium is intact. Soft tissue hematoma is noted in the left frontal region consistent with a recent history. Changes consistent with prior left frontal craniotomy are noted. Septal malacia changes are noted secondary to the previous craniotomy also in the left frontal region. Mild atrophic changes are seen. No findings to suggest acute hemorrhage, acute infarction or space-occupying mass lesion are noted. Ex vacuo dilatation of the left lateral ventricle is noted.  IMPRESSION: Small left frontal soft tissue scalp hematoma.  Chronic changes as described.   Electronically Signed   By: Inez Catalina M.D.   On: 08/13/2013 14:57   Ct Abdomen Pelvis W Contrast  08/13/2013   CLINICAL DATA:  Abdominal pain with nausea and weight loss. History of renal cell carcinoma.  EXAM: CT ABDOMEN AND PELVIS WITH CONTRAST  TECHNIQUE: Multidetector CT imaging of the abdomen and pelvis was performed using the standard protocol following bolus administration of intravenous contrast.  CONTRAST:  75 ml Omnipaque 300.  COMPARISON:  Abdominal pelvic CT 05/13/2012.  FINDINGS: There is stable scarring at the right lung base, including a nodular component in the right lower lobe measuring  1.4 cm on image 17. There is no significant pleural or pericardial effusion.  The gallbladder is surgically absent. There is no significant biliary dilatation. The liver and spleen appear unremarkable. The pancreas is diffusely atrophied without focal abnormality.  The right kidney is surgically absent. There is no mass within the nephrectomy bed. The left kidney appears normal. Both adrenal glands appear normal.  The stomach, small bowel and proximal colon demonstrate no significant findings. There is extensive sigmoid colon diverticulitis with wall thickening and surrounding inflammation. There are multiple extraluminal air fluid collections surrounding the sigmoid colon. These measure 3.5 x 2.7 cm along the left aspect of the proximal sigmoid colon (image 74) and 4.5 x 1.9 cm more distally along the left aspect of the sigmoid colon. Within the pelvic cul-de-sac, there is a 3.1 x 4.8 cm air-fluid collection on image 77. The contrast material has not yet passed through the sigmoid colon. The uterus and ovaries appear  normal. The bladder is nearly empty, but demonstrates no pneumatosis. A degree of pelvic floor relaxation is suspected. There is no evidence of bowel obstruction or pneumoperitoneum.  Chronic L1 compression deformity is unchanged. There are no worrisome osseous findings. Subchondral sclerosis anteriorly in the right femoral head is stable, potentially reflecting chronic avascular necrosis.  IMPRESSION: 1. Acute sigmoid diverticulitis with multiple pericolonic abscesses. 2. No evidence of free intraperitoneal air or bowel obstruction. 3. No evidence of metastatic renal cell carcinoma. The right nephrectomy bed and left kidney have stable appearances. 4. These results will be called to the ordering clinician or representative by the Radiology Department at the imaging location. Patient has been detained on site for subsequent disposition.   Electronically Signed   By: Camie Patience M.D.   On: 08/13/2013  12:36    Assessment/Plan: Diverticulitis with multiple intra-abdominal and pelvic abscesses Dr. Pascal Lux feels pt is candidate for percutaneous drainage of these abscesses Explained procedure, risks, complications, use of sedation. Labs reviewed, Coumadin on hold, but INR still 1.8 Given current clinical status, will defer procedure until INR at least 1.5 or less, most likely tomorrow. Recheck INR in am. Consent signed in chart  Ascencion Dike PA-C 08/14/2013, 12:06 PM

## 2013-08-14 NOTE — Progress Notes (Signed)
PROGRESS NOTE  Ashley [name] KVQ:[id number] DOB: 04-Aug-1928 DOA: 08/13/2013 PCP: Leonides Sake, MD  Ashley [name] is a 80 y.o. female sent to ED after getting an outpatient CT which showed diverticulitis and multiple abscesses. Has had abdominal pain, anorexia for months. Has lost almost 40 pounds. Had colonoscopy last year. Has been very weak and passed out within the past few weeks. On coumadin for atrial fibrillation. Has been seen by Dr. Georgette Dover who has consulted IR for drainage of abscesses. INR 2.4 today. hgb 8. Stool is heme negative. Denies bleeding. hgb 10 a year ago.    Assessment/Plan:  Diverticulitis with multiple abscesses Patient on Invanz Seen by Surgery who recommended IR to drain  No fever. Stable. Will add miralax with patient can take POs.  Weight Loss 40 lb weight loss over the last 4-6 months. Anorexia. Denies pain. Concerning for CA Will request nutrition consultation.  PAF On coumadin.  Received vitamin to reverse. Rate controlled.  Not on BB.  GERD protonix.  Rheumatoid Arthritis Complains of pain in her hands and legs. Holding DMARDs for now.  H/O RCC S/p nephrectomy Creatinine is normal.  H/O colon polyps - tubular adenoma.    DVT Prophylaxis:  Reversing INR for procedure / surgery  Code Status: full Family Communication: patient alert and orientated. Disposition Plan: inpatient.   Consultants: Surgery.  Procedures:  CT guided drainage of abscesses.  Antibiotics: Anti-infectives   Start     Dose/Rate Route Frequency Ordered Stop   08/13/13 2200  ertapenem (INVANZ) 1 g in sodium chloride 0.9 % 50 mL IVPB     1 g 100 mL/hr over 30 Minutes Intravenous Every 24 hours 08/13/13 1736     08/13/13 1430  piperacillin-tazobactam (ZOSYN) IVPB 3.375 g     3.375 g 100 mL/hr over 30 Minutes Intravenous  Once 08/13/13 1415 08/13/13 1611        HPI/Subjective: Dizzy, falling recently.  Objective: Filed Vitals:   08/13/13 1730  08/13/13 1823 08/13/13 2148 08/14/13 0434  BP: 123/73 125/75 106/56 100/60  Pulse: 80 76 60 71  Temp:  98.4 F (36.9 C) 98.6 F (37 C) 98.2 F (36.8 C)  TempSrc:  Oral Oral Oral  Resp:  20 18 12   Height:  5\' 5"  (1.651 m)    Weight:  48.308 kg (106 lb 8 oz)    SpO2: 99% 99% 96% 98%   No intake or output data in the 24 hours ending 08/14/13 1143 Filed Weights   08/13/13 1305 08/13/13 1823  Weight: 48.535 kg (107 lb) 48.308 kg (106 lb 8 oz)    Exam: General: thin, pleasant, AA female,  NAD, appears stated age  71: Anicteic Sclera, MMM. No pharyngeal erythema or exudates.  Small hematoma over left brow. Neck: Supple, no JVD, no masses  Cardiovascular: RRR, S1 S2 auscultated, no rubs, murmurs or gallops.   Respiratory: Clear to auscultation bilaterally with equal chest rise  Abdomen: Soft, nontender, nondistended, + bowel sounds.  Lower abd scar.  Palpable masses (scar tissue?) Extremities: warm dry without cyanosis clubbing or edema.  Neuro: AAOx3, cranial nerves grossly intact. Strength 5/5 in upper and lower extremities  Skin: Without rashes exudates or nodules.   Psych:  Mildly confused.  Cooperative.   Data Reviewed: Basic Metabolic Panel:  Recent Labs Lab 08/13/13 1413  NA 138  K 3.7  CL 101  CO2 22  GLUCOSE 92  BUN 9  CREATININE 0.69  CALCIUM 9.8   Liver Function Tests:  Recent Labs Lab 08/13/13 1413  AST 32  ALT 11  ALKPHOS 59  BILITOT 0.6  PROT 7.1  ALBUMIN 2.6*   CBC:  Recent Labs Lab 08/13/13 1413 08/14/13 0806  WBC 9.9 8.1  NEUTROABS 7.3  --   HGB 8.4* 7.5*  HCT 25.9* 23.0*  MCV 94.2 95.4  PLT 407* 420*     Studies: Ct Head Wo Contrast  08/13/2013   CLINICAL DATA:  Previous traumatic injury, nausea  EXAM: CT HEAD WITHOUT CONTRAST  TECHNIQUE: Contiguous axial images were obtained from the base of the skull through the vertex without intravenous contrast.  COMPARISON:  None.  FINDINGS: The bony calvarium is intact. Soft tissue  hematoma is noted in the left frontal region consistent with a recent history. Changes consistent with prior left frontal craniotomy are noted. Septal malacia changes are noted secondary to the previous craniotomy also in the left frontal region. Mild atrophic changes are seen. No findings to suggest acute hemorrhage, acute infarction or space-occupying mass lesion are noted. Ex vacuo dilatation of the left lateral ventricle is noted.  IMPRESSION: Small left frontal soft tissue scalp hematoma.  Chronic changes as described.   Electronically Signed   By: Inez Catalina M.D.   On: 08/13/2013 14:57   Ct Abdomen Pelvis W Contrast  08/13/2013   CLINICAL DATA:  Abdominal pain with nausea and weight loss. History of renal cell carcinoma.  EXAM: CT ABDOMEN AND PELVIS WITH CONTRAST  TECHNIQUE: Multidetector CT imaging of the abdomen and pelvis was performed using the standard protocol following bolus administration of intravenous contrast.  CONTRAST:  75 ml Omnipaque 300.  COMPARISON:  Abdominal pelvic CT 05/13/2012.  FINDINGS: There is stable scarring at the right lung base, including a nodular component in the right lower lobe measuring 1.4 cm on image 17. There is no significant pleural or pericardial effusion.  The gallbladder is surgically absent. There is no significant biliary dilatation. The liver and spleen appear unremarkable. The pancreas is diffusely atrophied without focal abnormality.  The right kidney is surgically absent. There is no mass within the nephrectomy bed. The left kidney appears normal. Both adrenal glands appear normal.  The stomach, small bowel and proximal colon demonstrate no significant findings. There is extensive sigmoid colon diverticulitis with wall thickening and surrounding inflammation. There are multiple extraluminal air fluid collections surrounding the sigmoid colon. These measure 3.5 x 2.7 cm along the left aspect of the proximal sigmoid colon (image 74) and 4.5 x 1.9 cm more  distally along the left aspect of the sigmoid colon. Within the pelvic cul-de-sac, there is a 3.1 x 4.8 cm air-fluid collection on image 77. The contrast material has not yet passed through the sigmoid colon. The uterus and ovaries appear normal. The bladder is nearly empty, but demonstrates no pneumatosis. A degree of pelvic floor relaxation is suspected. There is no evidence of bowel obstruction or pneumoperitoneum.  Chronic L1 compression deformity is unchanged. There are no worrisome osseous findings. Subchondral sclerosis anteriorly in the right femoral head is stable, potentially reflecting chronic avascular necrosis.  IMPRESSION: 1. Acute sigmoid diverticulitis with multiple pericolonic abscesses. 2. No evidence of free intraperitoneal air or bowel obstruction. 3. No evidence of metastatic renal cell carcinoma. The right nephrectomy bed and left kidney have stable appearances. 4. These results will be called to the ordering clinician or representative by the Radiology Department at the imaging location. Patient has been detained on site for subsequent disposition.   Electronically Signed   By:  Camie Patience M.D.   On: 08/13/2013 12:36    Scheduled Meds: . cycloSPORINE  1 drop Both Eyes BID  . ertapenem  1 g Intravenous Q24H  . ketorolac  1 drop Both Eyes QID  . latanoprost  1 drop Both Eyes QHS  . pantoprazole (PROTONIX) IV  40 mg Intravenous Q24H  . pneumococcal 23 valent vaccine  0.5 mL Intramuscular Tomorrow-1000  . polyethylene glycol  17 g Oral Daily  . polyvinyl alcohol  1 drop Both Eyes BID   Continuous Infusions: . sodium chloride 125 mL/hr at 08/13/13 1837    Principal Problem:   Diverticulitis of large intestine with abscess without bleeding Active Problems:   Atrial fibrillation   GERD (gastroesophageal reflux disease)   Rheumatoid arthritis(714.0)   Renal cell carcinoma   Diverticulitis   Anemia   UTI (urinary tract infection)   Protein-calorie malnutrition,  severe    Melton Alar, PA-C  [facility] Pager [phone]. If 7PM-7AM, please contact night-coverage at [email or web address], password [facility] 08/14/2013, 11:43 AM  LOS: 1 day

## 2013-08-14 NOTE — Evaluation (Signed)
Physical Therapy Evaluation Patient Details Name: Ashley [name] MRN: [id number] DOB: [birth year] Today's Date: 08/14/2013   History of Present Illness  80 y.o. female Sent to ED after getting an outpatient CT which showed diverticulitis and multiple abscesses.  Clinical Impression  **Pt admitted with diverticulitis and multiple abscesses*. Pt currently with functional limitations due to the deficits listed below (see PT Problem List).  Pt will benefit from skilled PT to increase their independence and safety with mobility to allow discharge to the venue listed below.   *    Follow Up Recommendations Home health PT    Equipment Recommendations  Rolling walker with 5" wheels    Recommendations for Other Services       Precautions / Restrictions Precautions Precautions: Fall Precaution Comments: poor vision Restrictions Weight Bearing Restrictions: No      Mobility  Bed Mobility                  Transfers Overall transfer level: Modified independent Equipment used: Rolling walker (2 wheeled)                Ambulation/Gait Ambulation/Gait assistance: Min assist Ambulation Distance (Feet): 180 Feet Assistive device: Rolling walker (2 wheeled) Gait Pattern/deviations: Step-through pattern   Gait velocity interpretation: at or above normal speed for age/gender General Gait Details: min a to negotiate obstacles due to poor vision  Stairs            Wheelchair Mobility    Modified Rankin (Stroke Patients Only)       Balance Overall balance assessment: History of Falls;Needs assistance (passed out 5 weeks ago) Sitting-balance support: Feet supported;No upper extremity supported Sitting balance-Leahy Scale: Normal     Standing balance support: Single extremity supported Standing balance-Leahy Scale: Good                               Pertinent Vitals/Pain **5/10 abdomen*    Home Living Family/patient expects to be discharged  to:: Private residence Living Arrangements: Spouse/significant other Available Help at Discharge: Available 24 hours/day Type of Home: House Home Access: Stairs to enter Entrance Stairs-Rails: Right;Left;Can reach both Technical brewer of Steps: 6 Home Layout: One level Home Equipment: Cane - single point      Prior Function Level of Independence: Needs assistance   Gait / Transfers Assistance Needed: assist for stairs           Hand Dominance        Extremity/Trunk Assessment   Upper Extremity Assessment: Overall WFL for tasks assessed           Lower Extremity Assessment: Overall WFL for tasks assessed      Cervical / Trunk Assessment: Normal  Communication   Communication: No difficulties  Cognition Arousal/Alertness: Awake/alert Behavior During Therapy: WFL for tasks assessed/performed Overall Cognitive Status: Within Functional Limits for tasks assessed                      General Comments      Exercises        Assessment/Plan    PT Assessment Patient needs continued PT services  PT Diagnosis Generalized weakness;Acute pain   PT Problem List Decreased activity tolerance;Decreased balance;Decreased mobility;Pain  PT Treatment Interventions DME instruction;Gait training;Functional mobility training;Stair training;Therapeutic activities;Patient/family education;Balance training;Therapeutic exercise   PT Goals (Current goals can be found in the Care Plan section) Acute Rehab PT Goals Patient Stated Goal: to get  my strength back PT Goal Formulation: With patient/family Time For Goal Achievement: 08/27/13 Potential to Achieve Goals: Good    Frequency Min 3X/week   Barriers to discharge        Co-evaluation               End of Session Equipment Utilized During Treatment: Gait belt Activity Tolerance: Patient limited by fatigue Patient left: in chair;with call bell/phone within reach;with chair alarm set Nurse  Communication: Mobility status         Time: 3810-1751 PT Time Calculation (min): 20 min   Charges:   PT Evaluation $Initial PT Evaluation Tier I: 1 Procedure PT Treatments $Gait Training: 8-22 mins   PT G CodesLucile Crater 08/14/2013, 12:43 PM [phone]

## 2013-08-14 NOTE — Care Management Note (Signed)
    Page 1 of 2   08/19/2013     3:38:51 PM CARE MANAGEMENT NOTE 08/19/2013  Patient:  Ashley [name], Ashley [name]   Account Number:  [id number]  Date Initiated:  08/14/2013  Documentation initiated by:  Tomi Bamberger  Subjective/Objective Assessment:   dx diverticulitis  admit- lives with spouse.     Action/Plan:   pt eval- rec hhpt and rolling walker   Anticipated DC Date:  08/19/2013   Anticipated DC Plan:  Ashley [name]  CM consult      [facility] Choice  HOME HEALTH   Choice offered to / List presented to:  C-1 Patient   DME arranged  Vassie Moselle      DME agency  [place] arranged  [place].   Status of service:  Completed, signed off Medicare Important Message given?  YES (If response is "NO", the following Medicare IM given date fields will be blank) Date Medicare IM given:  08/17/2013 Date Additional Medicare IM given:    Discharge Disposition:  [place]  Per UR Regulation:  Reviewed for med. necessity/level of care/duration of stay  If discussed at [place] of Stay Meetings, dates discussed:    Comments:  08/17/13 [place] RN,BSN [phone] NCM gave patient 30 day free trial savings card and $10 co pay savings card,  08/14/13 [place] RN,BSN [phone] patient  lives with spouse, per physical therapy rec hhpt and rolling walker.  Patient chose [facility] for hhpt and rolling walker. Referral made to [place] notified, order in for rolling walker, MD notified to put face to face in.

## 2013-08-14 NOTE — Progress Notes (Signed)
INITIAL NUTRITION ASSESSMENT  DOCUMENTATION CODES Per approved criteria  -Severe malnutrition in the context of chronic illness -Underweight   INTERVENTION: Diet advancement per MD's discretion. Recommend Ensure Complete po BID, each supplement provides 350 kcal and 13 grams of protein, once diet order permits. Monitor magnesium, potassium, and phosphorus daily for at least 3 days, MD to replete as needed, as pt is at risk for refeeding syndrome given severe malnutrition. RD to continue to follow nutrition care plan.  NUTRITION DIAGNOSIS: Inadequate oral intake related to poor appetite as evidenced by weight loss and dietary recall.   Goal: Intake to meet >90% of estimated nutrition needs.  Monitor:  weight trends, lab trends, I/O's, PO intake, supplement tolerance  Reason for Assessment: Malnutrition Screening Tool  80 y.o. female  Admitting Dx: diverticulitis  ASSESSMENT: PMHx significant for GERD, asthma, HA, renal cell carcinoma. Outpatient CT showed diverticulitis and multiple abscesses. Admitted with abdominal pain. Work-up reveals sigmoid diverticulitis.  Currently NPO, allowed ice chips.   Pt confirms that her usual body weight is 135 - 140 lb and last weighed this 2 months ago. Pt is now down to 106 lb, a weight change of 23% x 2 months - severe for this time frame. Pt states that her intake has dramatically decreased over the past month. On average, only eats 1 meal a day now, which is breakfast. She will fix herself eggs, sausage/bacon and grits and only eat half of a portion. Has tried to drink Ensure in the past 2 weeks. Severe clavicle wasting present.  Pt meets criteria for severe MALNUTRITION in the context of chronic illness as evidenced by 23% wt loss x 2 months, intake of <75% x at least 1 month, and severe muscle mass loss.  Potassium WNL  Height: Ht Readings from Last 1 Encounters:  08/13/13 5\' 5"  (1.651 m)    Weight: Wt Readings from Last 1  Encounters:  08/13/13 106 lb 8 oz (48.308 kg)    Ideal Body Weight: 125 lb  % Ideal Body Weight: 85%  Wt Readings from Last 10 Encounters:  08/13/13 106 lb 8 oz (48.308 kg)  05/26/13 119 lb 12.8 oz (54.341 kg)  05/28/12 139 lb 1.6 oz (63.095 kg)  05/21/12 137 lb (62.143 kg)  05/21/12 137 lb (62.143 kg)  12/11/11 130 lb 12.8 oz (59.33 kg)  05/22/11 128 lb (58.06 kg)  11/03/10 125 lb 12.8 oz (57.063 kg)  05/03/10 134 lb (60.782 kg)    Usual Body Weight: 135 - 140 lb  % Usual Body Weight: 77%  BMI:  Body mass index is 17.72 kg/(m^2). Underweight  Estimated Nutritional Needs: Kcal: 1300 - 1500 Protein: at least 70 g daily Fluid: approx 1.5 liters daily  Skin: intact  Diet Order: NPO  EDUCATION NEEDS: -No education needs identified at this time  No intake or output data in the 24 hours ending 08/14/13 0952  Last BM: 5/28   Labs:   Recent Labs Lab 08/13/13 1413  NA 138  K 3.7  CL 101  CO2 22  BUN 9  CREATININE 0.69  CALCIUM 9.8  GLUCOSE 92    CBG (last 3)  No results found for this basename: GLUCAP,  in the last 72 hours  Scheduled Meds: . cycloSPORINE  1 drop Both Eyes BID  . ertapenem  1 g Intravenous Q24H  . ketorolac  1 drop Both Eyes QID  . latanoprost  1 drop Both Eyes QHS  . pneumococcal 23 valent vaccine  0.5 mL Intramuscular  Tomorrow-1000  . polyvinyl alcohol  1 drop Both Eyes BID    Continuous Infusions: . sodium chloride 125 mL/hr at 08/13/13 4742    Past Medical History  Diagnosis Date  . Syncope   . Uterine prolapse   . Cryptococcal pneumonitis     right lower lobe  . GERD (gastroesophageal reflux disease)   . Asthma   . VZDGLOVF(643.3)     "monthly" (08/13/2013)  . Rheumatoid arthritis(714.0)   . Arthritis     "all over" (08/13/2013)  . Choroid melanoma of left eye   . Renal cell carcinoma     Left kidney    Past Surgical History  Procedure Laterality Date  . Nephrectomy Left     native  . Bladder suspension    .  Lung removal, partial    . Cholecystectomy    . Brain meningioma excision    . Loop recorder implant      Left Breast  . Colonoscopy N/A 05/21/2012    Procedure: COLONOSCOPY;  Surgeon: Arta Silence, MD;  Location: [facility];  Service: Endoscopy;  Laterality: N/A;  . Tonsillectomy    . Eye surgery Left     Choroid melanoma    Inda Coke MS, RD, LDN Inpatient Registered Dietitian Pager: [phone] After-hours pager: [phone]

## 2013-08-14 NOTE — Progress Notes (Signed)
Will give the patient another dose of vitamin K.  Kathryne Eriksson. Dahlia Bailiff, MD, [facility] [phone] [facility]

## 2013-08-15 ENCOUNTER — Inpatient Hospital Stay (HOSPITAL_COMMUNITY): Payer: Medicare Other

## 2013-08-15 DIAGNOSIS — D649 Anemia, unspecified: Secondary | ICD-10-CM | POA: Diagnosis not present

## 2013-08-15 DIAGNOSIS — K219 Gastro-esophageal reflux disease without esophagitis: Secondary | ICD-10-CM | POA: Diagnosis not present

## 2013-08-15 DIAGNOSIS — K630 Abscess of intestine: Secondary | ICD-10-CM | POA: Diagnosis not present

## 2013-08-15 DIAGNOSIS — I4891 Unspecified atrial fibrillation: Secondary | ICD-10-CM | POA: Diagnosis not present

## 2013-08-15 DIAGNOSIS — K5732 Diverticulitis of large intestine without perforation or abscess without bleeding: Secondary | ICD-10-CM | POA: Diagnosis not present

## 2013-08-15 LAB — COMPREHENSIVE METABOLIC PANEL
ALBUMIN: 1.9 g/dL — AB (ref 3.5–5.2)
ALT: 7 U/L (ref 0–35)
AST: 20 U/L (ref 0–37)
Alkaline Phosphatase: 44 U/L (ref 39–117)
BUN: 4 mg/dL — ABNORMAL LOW (ref 6–23)
CALCIUM: 8.3 mg/dL — AB (ref 8.4–10.5)
CO2: 22 mEq/L (ref 19–32)
CREATININE: 0.58 mg/dL (ref 0.50–1.10)
Chloride: 112 mEq/L (ref 96–112)
GFR calc Af Amer: >90 mL/min (ref >90)
GFR calc non Af Amer: 84 mL/min — ABNORMAL LOW (ref >90)
Glucose, Bld: 83 mg/dL (ref 70–99)
Potassium: 2.6 mEq/L — CL (ref 3.7–5.3)
Sodium: 146 mEq/L (ref 137–147)
TOTAL PROTEIN: 4.9 g/dL — AB (ref 6.0–8.3)
Total Bilirubin: 0.4 mg/dL (ref 0.3–1.2)

## 2013-08-15 LAB — CBC
HCT: 21.2 % — ABNORMAL LOW (ref 36.0–46.0)
Hemoglobin: 6.6 g/dL — CL (ref 12.0–15.0)
MCH: 30.1 pg (ref 26.0–34.0)
MCHC: 31.1 g/dL (ref 30.0–36.0)
MCV: 96.8 fL (ref 78.0–100.0)
PLATELETS: 375 10*3/uL (ref 150–400)
RBC: 2.19 MIL/uL — ABNORMAL LOW (ref 3.87–5.11)
RDW: 19 % — ABNORMAL HIGH (ref 11.5–15.5)
WBC: 5.1 10*3/uL (ref 4.0–10.5)

## 2013-08-15 LAB — AFP TUMOR MARKER: AFP-Tumor Marker: 1.9 ng/mL (ref 0.0–8.0)

## 2013-08-15 LAB — PROTIME-INR
INR: 1.42 (ref 0.00–1.49)
Prothrombin Time: 17 seconds — ABNORMAL HIGH (ref 11.6–15.2)

## 2013-08-15 LAB — PREPARE RBC (CROSSMATCH)

## 2013-08-15 MED ORDER — LIDOCAINE-EPINEPHRINE 1 %-1:100000 IJ SOLN
INTRAMUSCULAR | Status: AC
  Filled 2013-08-15: qty 1

## 2013-08-15 MED ORDER — SODIUM CHLORIDE 0.9 % IJ SOLN
10.0000 mL | INTRAMUSCULAR | Status: DC | PRN
  Administered 2013-08-15 – 2013-08-19 (×7): 10 mL

## 2013-08-15 MED ORDER — MIDAZOLAM HCL 2 MG/2ML IJ SOLN
INTRAMUSCULAR | Status: AC
  Filled 2013-08-15: qty 4

## 2013-08-15 MED ORDER — POTASSIUM CHLORIDE 10 MEQ/100ML IV SOLN
10.0000 meq | INTRAVENOUS | Status: AC
  Administered 2013-08-15 (×2): 10 meq via INTRAVENOUS
  Filled 2013-08-15 (×2): qty 100

## 2013-08-15 MED ORDER — POTASSIUM CHLORIDE 10 MEQ/100ML IV SOLN
10.0000 meq | INTRAVENOUS | Status: AC
  Administered 2013-08-15 (×2): 10 meq via INTRAVENOUS
  Filled 2013-08-15 (×4): qty 100

## 2013-08-15 MED ORDER — POTASSIUM CHLORIDE CRYS ER 20 MEQ PO TBCR
40.0000 meq | EXTENDED_RELEASE_TABLET | Freq: Once | ORAL | Status: AC
  Administered 2013-08-15: 40 meq via ORAL
  Filled 2013-08-15: qty 2

## 2013-08-15 MED ORDER — SODIUM CHLORIDE 0.9 % IJ SOLN
10.0000 mL | Freq: Two times a day (BID) | INTRAMUSCULAR | Status: DC
  Administered 2013-08-17: 10 mL

## 2013-08-15 MED ORDER — FENTANYL CITRATE 0.05 MG/ML IJ SOLN
INTRAMUSCULAR | Status: AC
  Filled 2013-08-15: qty 2

## 2013-08-15 MED ORDER — FUROSEMIDE 10 MG/ML IJ SOLN
20.0000 mg | Freq: Once | INTRAMUSCULAR | Status: AC
  Administered 2013-08-15: 20 mg via INTRAVENOUS

## 2013-08-15 MED ORDER — SODIUM CHLORIDE 0.9 % IV SOLN
INTRAVENOUS | Status: DC
  Administered 2013-08-15 – 2013-08-16 (×2): via INTRAVENOUS
  Filled 2013-08-15 (×5): qty 1000

## 2013-08-15 MED ORDER — ACETAMINOPHEN 325 MG PO TABS
650.0000 mg | ORAL_TABLET | Freq: Once | ORAL | Status: AC
  Administered 2013-08-15: 650 mg via ORAL

## 2013-08-15 MED ORDER — FENTANYL CITRATE 0.05 MG/ML IJ SOLN
INTRAMUSCULAR | Status: AC | PRN
  Administered 2013-08-15: 12.5 ug via INTRAVENOUS
  Administered 2013-08-15: 25 ug via INTRAVENOUS
  Administered 2013-08-15: 12.5 ug via INTRAVENOUS
  Administered 2013-08-15: 25 ug via INTRAVENOUS

## 2013-08-15 MED ORDER — FUROSEMIDE 10 MG/ML IJ SOLN
INTRAMUSCULAR | Status: AC
  Filled 2013-08-15: qty 4

## 2013-08-15 MED ORDER — MIDAZOLAM HCL 2 MG/2ML IJ SOLN
INTRAMUSCULAR | Status: AC | PRN
  Administered 2013-08-15: 1 mg via INTRAVENOUS
  Administered 2013-08-15 (×2): 0.5 mg via INTRAVENOUS

## 2013-08-15 MED ORDER — DIPHENHYDRAMINE HCL 50 MG/ML IJ SOLN
25.0000 mg | Freq: Once | INTRAMUSCULAR | Status: AC
  Administered 2013-08-15: 25 mg via INTRAVENOUS
  Filled 2013-08-15: qty 1

## 2013-08-15 NOTE — Progress Notes (Signed)
CRITICAL VALUE ALERT  Critical value received:  K 2.6   Date of notification:  08/15/2013  Time of notification: 7:39 AM  Critical value read back:yes  Nurse who received alert:  Girtha Rm  MD notified (1st page):  ghimire   Time of first page:  7:39  MD notified (2nd page):  Time of second page:  Responding MD:  ghimire  Time MD responded:  7:40

## 2013-08-15 NOTE — Progress Notes (Signed)
PATIENT DETAILS Name: Ashley [name] Age: 80 y.o. Sex: female Date of Birth: [birth year] Admit Date: 08/13/2013 Admitting Physician No admitting provider for patient encounter. [id number],DTOIZ L, MD  Subjective: No major complaints-wants to go home  Assessment/Plan: Principal Problem:   Diverticulitis of large intestine with abscess without bleeding -admitted and started on Invanz since 5/28, Gen surgery and IR consutled. Underwent CT guided drainage catheter placement and drainage of the abscess. On full liquids, per [facility] plan to repeat CT Abd in about 3 days.Remains afebrile and with no Leukocytosis.  Active Problems: Anemia -suspect anemia of acute illness, and ?slow GI bleed -transfuse 2 units of PRBC on 5/30-recheck in am    Atrial fibrillation -stable -coumadin on hold-infact reversed with Vit K-given anemia-continue to keep on hold  Hypokalemia -replete and recheck  -check Magnesium  GERD  protonix.   Rheumatoid Arthritis  Complains of pain in her hands and legs.  Holding DMARDs for now.   H/O RCC  S/p nephrectomy  Creatinine is normal.  Protein-calorie malnutrition, severe -in the context of chronic illness -supplements with diet advanced further  Underweight  Disposition: Remain inpatient  DVT Prophylaxis: SCD's  Code Status: Full code   Family Communication Son and spouse at bedside  Procedures:  5/30-Underwent CT guided drainage catheter placement and drainage of the abscess.  CONSULTS:  general surgery  Time spent 40 minutes-which includes 50% of the time with face-to-face with patient/ family and coordinating care related to the above assessment and plan.    MEDICATIONS: Scheduled Meds: . acetaminophen  650 mg Oral Once  . cycloSPORINE  1 drop Both Eyes BID  . diphenhydrAMINE  25 mg Intravenous Once  . ertapenem  1 g Intravenous Q24H  . fentaNYL      . furosemide  20 mg Intravenous Once  . ketorolac  1 drop Both Eyes  QID  . latanoprost  1 drop Both Eyes QHS  . lidocaine-EPINEPHrine      . midazolam      . pantoprazole (PROTONIX) IV  40 mg Intravenous Q24H  . polyethylene glycol  17 g Oral Daily  . polyvinyl alcohol  1 drop Both Eyes BID  . sodium chloride  10-40 mL Intracatheter Q12H   Continuous Infusions: . sodium chloride 0.9 % 1,000 mL with potassium chloride 20 mEq infusion 75 mL/hr at 08/15/13 0839   PRN Meds:.acetaminophen, acetaminophen, HYDROmorphone (DILAUDID) injection, ondansetron (ZOFRAN) IV, ondansetron, sodium chloride  Antibiotics: Anti-infectives   Start     Dose/Rate Route Frequency Ordered Stop   08/13/13 2200  ertapenem (INVANZ) 1 g in sodium chloride 0.9 % 50 mL IVPB     1 g 100 mL/hr over 30 Minutes Intravenous Every 24 hours 08/13/13 1736     08/13/13 1430  piperacillin-tazobactam (ZOSYN) IVPB 3.375 g     3.375 g 100 mL/hr over 30 Minutes Intravenous  Once 08/13/13 1415 08/13/13 1611       PHYSICAL EXAM: Vital signs in last 24 hours: Filed Vitals:   08/15/13 1010 08/15/13 1015 08/15/13 1019 08/15/13 1048  BP: 152/64 145/66 145/66 133/61  Pulse: 85 88 86 73  Temp:    97.2 F (36.2 C)  TempSrc:    Oral  Resp: 18 16 20 18   Height:      Weight:      SpO2: 100% 100% 100% 93%    Weight change:  Filed Weights   08/13/13 1305 08/13/13 1823  Weight: 48.535 kg (107 lb)  48.308 kg (106 lb 8 oz)   Body mass index is 17.72 kg/(m^2).   Gen Exam: Awake and alert with clear speech.   Neck: Supple, No JVD.   Chest: B/L Clear.   CVS: S1 S2 Regular, no murmurs.  Abdomen: soft, BS +, tender in LLQ area, non distended.  Extremities: no edema, lower extremities warm to touch. Neurologic: Non Focal.   Skin: No Rash.   Wounds: N/A.   Intake/Output from previous day:  Intake/Output Summary (Last 24 hours) at 08/15/13 1401 Last data filed at 08/15/13 0600  Gross per 24 hour  Intake 3972.92 ml  Output      0 ml  Net 3972.92 ml     LAB RESULTS: CBC  Recent  Labs Lab 08/13/13 1413 08/14/13 0806 08/15/13 0600  WBC 9.9 8.1 5.1  HGB 8.4* 7.5* 6.6*  HCT 25.9* 23.0* 21.2*  PLT 407* 420* 375  MCV 94.2 95.4 96.8  MCH 30.5 31.1 30.1  MCHC 32.4 32.6 31.1  RDW 18.7* 19.1* 19.0*  LYMPHSABS 1.5  --   --   MONOABS 1.1*  --   --   EOSABS 0.0  --   --   BASOSABS 0.0  --   --     Chemistries   Recent Labs Lab 08/13/13 1413 08/15/13 0600  NA 138 146  K 3.7 2.6*  CL 101 112  CO2 22 22  GLUCOSE 92 83  BUN 9 4*  CREATININE 0.69 0.58  CALCIUM 9.8 8.3*    CBG: No results found for this basename: GLUCAP,  in the last 168 hours  GFR Estimated Creatinine Clearance: 41.3 ml/min (by C-G formula based on Cr of 0.58).  Coagulation profile  Recent Labs Lab 08/13/13 1413 08/14/13 0806 08/15/13 0600  INR 2.20* 1.80* 1.42    Cardiac Enzymes No results found for this basename: CK, CKMB, TROPONINI, MYOGLOBIN,  in the last 168 hours  No components found with this basename: POCBNP,  No results found for this basename: DDIMER,  in the last 72 hours No results found for this basename: HGBA1C,  in the last 72 hours No results found for this basename: CHOL, HDL, LDLCALC, TRIG, CHOLHDL, LDLDIRECT,  in the last 72 hours No results found for this basename: TSH, T4TOTAL, FREET3, T3FREE, THYROIDAB,  in the last 72 hours  Recent Labs  08/14/13 0806  VITAMINB12 489  FOLATE 12.5  FERRITIN 654*  TIBC 220*  IRON 46   No results found for this basename: LIPASE, AMYLASE,  in the last 72 hours  Urine Studies No results found for this basename: UACOL, UAPR, USPG, UPH, UTP, UGL, UKET, UBIL, UHGB, UNIT, UROB, ULEU, UEPI, UWBC, URBC, UBAC, CAST, CRYS, UCOM, BILUA,  in the last 72 hours  MICROBIOLOGY: No results found for this or any previous visit (from the past 240 hour(s)).  RADIOLOGY STUDIES/RESULTS: Ct Head Wo Contrast  08/13/2013   CLINICAL DATA:  Previous traumatic injury, nausea  EXAM: CT HEAD WITHOUT CONTRAST  TECHNIQUE: Contiguous axial  images were obtained from the base of the skull through the vertex without intravenous contrast.  COMPARISON:  None.  FINDINGS: The bony calvarium is intact. Soft tissue hematoma is noted in the left frontal region consistent with a recent history. Changes consistent with prior left frontal craniotomy are noted. Septal malacia changes are noted secondary to the previous craniotomy also in the left frontal region. Mild atrophic changes are seen. No findings to suggest acute hemorrhage, acute infarction or space-occupying mass lesion are noted.  Ex vacuo dilatation of the left lateral ventricle is noted.  IMPRESSION: Small left frontal soft tissue scalp hematoma.  Chronic changes as described.   Electronically Signed   By: Inez Catalina M.D.   On: 08/13/2013 14:57   Ct Abdomen Pelvis W Contrast  08/13/2013   CLINICAL DATA:  Abdominal pain with nausea and weight loss. History of renal cell carcinoma.  EXAM: CT ABDOMEN AND PELVIS WITH CONTRAST  TECHNIQUE: Multidetector CT imaging of the abdomen and pelvis was performed using the standard protocol following bolus administration of intravenous contrast.  CONTRAST:  75 ml Omnipaque 300.  COMPARISON:  Abdominal pelvic CT 05/13/2012.  FINDINGS: There is stable scarring at the right lung base, including a nodular component in the right lower lobe measuring 1.4 cm on image 17. There is no significant pleural or pericardial effusion.  The gallbladder is surgically absent. There is no significant biliary dilatation. The liver and spleen appear unremarkable. The pancreas is diffusely atrophied without focal abnormality.  The right kidney is surgically absent. There is no mass within the nephrectomy bed. The left kidney appears normal. Both adrenal glands appear normal.  The stomach, small bowel and proximal colon demonstrate no significant findings. There is extensive sigmoid colon diverticulitis with wall thickening and surrounding inflammation. There are multiple extraluminal  air fluid collections surrounding the sigmoid colon. These measure 3.5 x 2.7 cm along the left aspect of the proximal sigmoid colon (image 74) and 4.5 x 1.9 cm more distally along the left aspect of the sigmoid colon. Within the pelvic cul-de-sac, there is a 3.1 x 4.8 cm air-fluid collection on image 77. The contrast material has not yet passed through the sigmoid colon. The uterus and ovaries appear normal. The bladder is nearly empty, but demonstrates no pneumatosis. A degree of pelvic floor relaxation is suspected. There is no evidence of bowel obstruction or pneumoperitoneum.  Chronic L1 compression deformity is unchanged. There are no worrisome osseous findings. Subchondral sclerosis anteriorly in the right femoral head is stable, potentially reflecting chronic avascular necrosis.  IMPRESSION: 1. Acute sigmoid diverticulitis with multiple pericolonic abscesses. 2. No evidence of free intraperitoneal air or bowel obstruction. 3. No evidence of metastatic renal cell carcinoma. The right nephrectomy bed and left kidney have stable appearances. 4. These results will be called to the ordering clinician or representative by the Radiology Department at the imaging location. Patient has been detained on site for subsequent disposition.   Electronically Signed   By: Camie Patience M.D.   On: 08/13/2013 12:36   Ct Image Guided Drainage By Percutaneous Catheter  08/15/2013   INDICATION: Diverticular abscess. Please attempted CT-guided drainage of the dominant collection located within the lower pelvis.  EXAM: CT IMAGE GUIDED DRAINAGE BY PERCUTANEOUS CATHETER  COMPARISON:  CT the abdomen pelvis - 08/13/2013  MEDICATIONS: The patient is currently admitted to the hospital and receiving intravenous antibiotics. The antibiotics were administered within an appropriate time frame prior to the initiation of the procedure.  ANESTHESIA/SEDATION: Fentanyl 75 mcg IV; Versed 2 mg IV  Total Moderate Sedation time  25 minutes   CONTRAST:  None  COMPLICATIONS: None immediate  PROCEDURE: Informed written consent was obtained from the patient after a discussion of the risks, benefits and alternatives to treatment. The patient was placed prone on the CT gantry and a pre procedural CT was performed re-demonstrating the known abdominal abscess/fluid collection within the lower pelvis, which appeared minimally decreased in size in the interval, currently measuring approximately 2.9 x 1.9 cm (  image 40, series 2), previously, 4.8 x 3.1 cm.  Of note, the additional smaller diverticular abscesses within the left hemipelvis also appear to have decreased in size in interval measuring approximately 1.8 x 3.2 cm, previously, 1.9 x 4.5 cm, and approximately 1.2 x 2.3 cm, previously 3.5 x 2.7 cm (both smaller abscesses seen on image 30, series 2).  The decision was made to perform CT guided drainage of the residual dominant collection within the lower pelvis. The procedure was planned. A timeout was performed prior to the initiation of the procedure.  The left buttocks was prepped and draped in the usual sterile fashion. The overlying soft tissues were anesthetized with 1% lidocaine with epinephrine. Appropriate trajectory was planned with the use of a 22 gauge spinal needle. An 18 gauge trocar needle was advanced into the abscess/fluid collection and a short Amplatz super stiff wire was coiled within the collection. Appropriate positioning was confirmed with a limited CT scan. The tract was serially dilated allowing placement of a 10 [place] all-purpose drainage catheter. Appropriate positioning was confirmed with a limited postprocedural CT scan.  Approximately 5 cc of purulent fluid was aspirated. The tube was connected to a drainage bag and sutured in place. A dressing was placed. The patient tolerated the procedure well without immediate post procedural complication.  IMPRESSION: 1. Successful CT guided placement of a 10 [place] all purpose drain  catheter into the dominant diverticular abscess within the lower pelvis via a left trans gluteal approach with aspiration of 5 mL of purulent fluid. Samples were sent to the laboratory as requested by the ordering clinical team. 2. The two additional smaller diverticular abscesses with the left hemipelvis have decreased in size and are too small to allow for percutaneous drainage.  PLAN: - Pending drain output, would recommend followup pelvic CT with oral and IV contrast in approximately 5-7 days.  - Recommend fluoroscopic drainage catheter injection prior to removal.   Electronically Signed   By: Sandi Mariscal M.D.   On: 08/15/2013 11:07    Kay Shippy Kristeen Mans, MD  [facility] Pager:[phone]  If 7PM-7AM, please contact night-coverage [email or web address] Password [email or web address] 08/15/2013, 2:01 PM   LOS: 2 days   **Disclaimer: This note may have been dictated with voice recognition software. Similar sounding words can inadvertently be transcribed and this note may contain transcription errors which may not have been corrected upon publication of note.**

## 2013-08-15 NOTE — Progress Notes (Signed)
Peripherally Inserted Central Catheter/Midline Placement  The IV Nurse has discussed with the patient and/or persons authorized to consent for the patient, the purpose of this procedure and the potential benefits and risks involved with this procedure.  The benefits include less needle sticks, lab draws from the catheter and patient may be discharged home with the catheter.  Risks include, but not limited to, infection, bleeding, blood clot (thrombus formation), and puncture of an artery; nerve damage and irregular heat beat.  Alternatives to this procedure were also discussed.  PICC/Midline Placement Documentation  PICC / Midline Double Lumen 62/22/97 PICC Right Basilic 38 cm 0 cm (Active)  Indication for Insertion or Continuance of Line Poor Vasculature-patient has had multiple peripheral attempts or PIVs lasting less than 24 hours 08/15/2013  1:48 PM  Exposed Catheter (cm) 0 cm 08/15/2013  1:48 PM  Lumen #1 Status Flushed;Saline locked;Blood return noted 08/15/2013  1:48 PM  Lumen #2 Status Flushed;Saline locked;Blood return noted 08/15/2013  1:48 PM  Dressing Change Due 08/22/13 08/15/2013  1:48 PM       Gordan Payment 08/15/2013, 1:50 PM

## 2013-08-15 NOTE — Progress Notes (Signed)
General Surgery [facility]  Subjective: For IR drainage of abscess today, she is in IR now.  Objective: Vital signs in last 24 hours: Temp:  [98.5 F (36.9 C)-98.6 F (37 C)] 98.6 F (37 C) (05/30 0513) Pulse Rate:  [72-94] 73 (05/30 0919) Resp:  [16-18] 16 (05/30 0919) BP: (112-149)/(63-78) 126/63 mmHg (05/30 0919) SpO2:  [94 %-99 %] 99 % (05/30 0919) Last BM Date: 08/14/13 300 Po recorded 4 stools recorded NPO Afebrile K+ 2.6 H/H= 6.6/21 INR 1.42 AFP is normal.  Intake/Output from previous day: 05/29 0701 - 05/30 0700 In: 3972.9 [P.O.:300; I.V.:3672.9] Out: -  Intake/Output this shift:   Exam: Abd - soft without distension Drain placed transguteal with thin serosanguinous in tubing, nothing in drainage bag at this time.  Lab Results:   Recent Labs  08/14/13 0806 08/15/13 0600  WBC 8.1 5.1  HGB 7.5* 6.6*  HCT 23.0* 21.2*  PLT 420* 375    BMET  Recent Labs  08/13/13 1413 08/15/13 0600  NA 138 146  K 3.7 2.6*  CL 101 112  CO2 22 22  GLUCOSE 92 83  BUN 9 4*  CREATININE 0.69 0.58  CALCIUM 9.8 8.3*   PT/INR  Recent Labs  08/14/13 0806 08/15/13 0600  LABPROT 20.4* 17.0*  INR 1.80* 1.42     Recent Labs Lab 08/13/13 1413 08/15/13 0600  AST 32 20  ALT 11 7  ALKPHOS 59 44  BILITOT 0.6 0.4  PROT 7.1 4.9*  ALBUMIN 2.6* 1.9*     Lipase  No results found for this basename: lipase     Studies/Results: Ct Head Wo Contrast  08/13/2013   CLINICAL DATA:  Previous traumatic injury, nausea  EXAM: CT HEAD WITHOUT CONTRAST  TECHNIQUE: Contiguous axial images were obtained from the base of the skull through the vertex without intravenous contrast.  COMPARISON:  None.  FINDINGS: The bony calvarium is intact. Soft tissue hematoma is noted in the left frontal region consistent with a recent history. Changes consistent with prior left frontal craniotomy are noted. Septal malacia changes are noted secondary to the previous  craniotomy also in the left frontal region. Mild atrophic changes are seen. No findings to suggest acute hemorrhage, acute infarction or space-occupying mass lesion are noted. Ex vacuo dilatation of the left lateral ventricle is noted.  IMPRESSION: Small left frontal soft tissue scalp hematoma.  Chronic changes as described.   Electronically Signed   By: Inez Catalina M.D.   On: 08/13/2013 14:57   Ct Abdomen Pelvis W Contrast  08/13/2013   CLINICAL DATA:  Abdominal pain with nausea and weight loss. History of renal cell carcinoma.  EXAM: CT ABDOMEN AND PELVIS WITH CONTRAST  TECHNIQUE: Multidetector CT imaging of the abdomen and pelvis was performed using the standard protocol following bolus administration of intravenous contrast.  CONTRAST:  75 ml Omnipaque 300.  COMPARISON:  Abdominal pelvic CT 05/13/2012.  FINDINGS: There is stable scarring at the right lung base, including a nodular component in the right lower lobe measuring 1.4 cm on image 17. There is no significant pleural or pericardial effusion.  The gallbladder is surgically absent. There is no significant biliary dilatation. The liver and spleen appear unremarkable. The pancreas is diffusely atrophied without focal abnormality.  The right kidney is surgically absent. There is no mass within the nephrectomy bed. The left kidney appears normal. Both adrenal glands appear normal.  The stomach, small bowel and proximal colon demonstrate no significant findings. There is extensive  sigmoid colon diverticulitis with wall thickening and surrounding inflammation. There are multiple extraluminal air fluid collections surrounding the sigmoid colon. These measure 3.5 x 2.7 cm along the left aspect of the proximal sigmoid colon (image 74) and 4.5 x 1.9 cm more distally along the left aspect of the sigmoid colon. Within the pelvic cul-de-sac, there is a 3.1 x 4.8 cm air-fluid collection on image 77. The contrast material has not yet passed through the sigmoid colon.  The uterus and ovaries appear normal. The bladder is nearly empty, but demonstrates no pneumatosis. A degree of pelvic floor relaxation is suspected. There is no evidence of bowel obstruction or pneumoperitoneum.  Chronic L1 compression deformity is unchanged. There are no worrisome osseous findings. Subchondral sclerosis anteriorly in the right femoral head is stable, potentially reflecting chronic avascular necrosis.  IMPRESSION: 1. Acute sigmoid diverticulitis with multiple pericolonic abscesses. 2. No evidence of free intraperitoneal air or bowel obstruction. 3. No evidence of metastatic renal cell carcinoma. The right nephrectomy bed and left kidney have stable appearances. 4. These results will be called to the ordering clinician or representative by the Radiology Department at the imaging location. Patient has been detained on site for subsequent disposition.   Electronically Signed   By: Camie Patience M.D.   On: 08/13/2013 12:36    Medications: . acetaminophen  650 mg Oral Once  . cycloSPORINE  1 drop Both Eyes BID  . diphenhydrAMINE  25 mg Intravenous Once  . ertapenem  1 g Intravenous Q24H  . fentaNYL      . furosemide  20 mg Intravenous Once  . ketorolac  1 drop Both Eyes QID  . latanoprost  1 drop Both Eyes QHS  . lidocaine-EPINEPHrine      . midazolam      . pantoprazole (PROTONIX) IV  40 mg Intravenous Q24H  . pneumococcal 23 valent vaccine  0.5 mL Intramuscular Tomorrow-1000  . polyethylene glycol  17 g Oral Daily  . polyvinyl alcohol  1 drop Both Eyes BID  . potassium chloride  10 mEq Intravenous Q1 Hr x 4    Assessment/Plan 1. Sigmoid diverticulitis with several areas of extraluminal air fluid collections. 3.5 x 2.7, 4.5 x 1.9 and 3.1 x 4.8 cm fluid collections 2. Multiple medical comorbidities  3. Anticoagulation  4. Anemia - chronic vs. Slow lower GI bleed  5. ?urinary tract infection  6. Hx renal cell CA s/p right nephrectomy  7. PCM 8.  S/p left nephrectomy,  lobectomy, brain meningioma excision, left eye for melonoma     LOS: 2 days    Earnstine Regal 08/15/2013  [facility]  Patient back in room from IR.  Largest abscess successfully drained.  Other two collections smaller and do not require drainage at this point.  Discussed findings and plans for management at length with patient, her husband, and her son at bedside.  Will follow.  Repeat CT scan in about 3 days.  Continue abx.  Earnstine Regal, MD, [facility] Office: [phone]

## 2013-08-15 NOTE — Progress Notes (Signed)
RN spoke with IR MD and Attending MD. Orders received to start potassium IV fluids and attempt to gain second IV access. Attending MD okay with pt going to IR before blood transfusion. Pt's VSS.

## 2013-08-15 NOTE — Progress Notes (Signed)
CRITICAL VALUE ALERT  Critical value received:  Hgb 6.6  Date of notification:  08/15/2013  Time of notification:  7:38 AM  Critical value read back:yes  Nurse who received alert:  Girtha Rm   MD notified (1st page):  ghimire  Time of first page:  7:38  MD notified (2nd page):  Time of second page:  Responding MD:  ghimire  Time MD responded:  7:40

## 2013-08-15 NOTE — Procedures (Signed)
Technically successful CT guided drainage catheter placement into pelvis via a L TG approach yielding 5 cc of purulent material.  Samples sent to laboratory.  No immediate post procedural complications.

## 2013-08-16 DIAGNOSIS — K5732 Diverticulitis of large intestine without perforation or abscess without bleeding: Secondary | ICD-10-CM | POA: Diagnosis not present

## 2013-08-16 DIAGNOSIS — E43 Unspecified severe protein-calorie malnutrition: Secondary | ICD-10-CM | POA: Diagnosis not present

## 2013-08-16 DIAGNOSIS — K630 Abscess of intestine: Secondary | ICD-10-CM | POA: Diagnosis not present

## 2013-08-16 DIAGNOSIS — D649 Anemia, unspecified: Secondary | ICD-10-CM | POA: Diagnosis not present

## 2013-08-16 DIAGNOSIS — I4891 Unspecified atrial fibrillation: Secondary | ICD-10-CM | POA: Diagnosis not present

## 2013-08-16 LAB — CBC
HEMATOCRIT: 27.8 % — AB (ref 36.0–46.0)
Hemoglobin: 9.4 g/dL — ABNORMAL LOW (ref 12.0–15.0)
MCH: 30.6 pg (ref 26.0–34.0)
MCHC: 33.8 g/dL (ref 30.0–36.0)
MCV: 90.6 fL (ref 78.0–100.0)
PLATELETS: 302 10*3/uL (ref 150–400)
RBC: 3.07 MIL/uL — ABNORMAL LOW (ref 3.87–5.11)
RDW: 18 % — AB (ref 11.5–15.5)
WBC: 7.1 10*3/uL (ref 4.0–10.5)

## 2013-08-16 LAB — TYPE AND SCREEN
ABO/RH(D): O POS
Antibody Screen: NEGATIVE
Unit division: 0
Unit division: 0

## 2013-08-16 LAB — BASIC METABOLIC PANEL
BUN: 4 mg/dL — AB (ref 6–23)
CALCIUM: 8.5 mg/dL (ref 8.4–10.5)
CO2: 24 mEq/L (ref 19–32)
CREATININE: 0.58 mg/dL (ref 0.50–1.10)
Chloride: 112 mEq/L (ref 96–112)
GFR calc non Af Amer: 84 mL/min — ABNORMAL LOW (ref >90)
Glucose, Bld: 84 mg/dL (ref 70–99)
Potassium: 3.8 mEq/L (ref 3.7–5.3)
Sodium: 145 mEq/L (ref 137–147)

## 2013-08-16 LAB — MAGNESIUM: Magnesium: 1.4 mg/dL — ABNORMAL LOW (ref 1.5–2.5)

## 2013-08-16 MED ORDER — SACCHAROMYCES BOULARDII 250 MG PO CAPS
250.0000 mg | ORAL_CAPSULE | Freq: Two times a day (BID) | ORAL | Status: DC
  Administered 2013-08-16 – 2013-08-19 (×7): 250 mg via ORAL
  Filled 2013-08-16 (×9): qty 1

## 2013-08-16 MED ORDER — MAGIC MOUTHWASH
15.0000 mL | Freq: Four times a day (QID) | ORAL | Status: DC | PRN
  Filled 2013-08-16: qty 15

## 2013-08-16 MED ORDER — HYDROCORTISONE ACE-PRAMOXINE 2.5-1 % RE CREA
1.0000 "application " | TOPICAL_CREAM | Freq: Four times a day (QID) | RECTAL | Status: DC | PRN
  Filled 2013-08-16: qty 30

## 2013-08-16 MED ORDER — MULTIVITAMINS PO CAPS: 1.0000 | ORAL_CAPSULE | Freq: Every day | ORAL | Status: DC

## 2013-08-16 MED ORDER — ALUM & MAG HYDROXIDE-SIMETH 200-200-20 MG/5ML PO SUSP: 30.0000 mL | Freq: Four times a day (QID) | ORAL | Status: DC | PRN

## 2013-08-16 MED ORDER — FENOFIBRATE 54 MG PO TABS
54.0000 mg | ORAL_TABLET | Freq: Every day | ORAL | Status: DC
  Administered 2013-08-16 – 2013-08-19 (×4): 54 mg via ORAL
  Filled 2013-08-16 (×4): qty 1

## 2013-08-16 MED ORDER — LIP MEDEX EX OINT
1.0000 "application " | TOPICAL_OINTMENT | Freq: Two times a day (BID) | CUTANEOUS | Status: DC
  Administered 2013-08-16 – 2013-08-19 (×7): 1 via TOPICAL
  Filled 2013-08-16: qty 7

## 2013-08-16 MED ORDER — ACETAMINOPHEN 500 MG PO TABS
1000.0000 mg | ORAL_TABLET | Freq: Three times a day (TID) | ORAL | Status: DC
  Administered 2013-08-16 – 2013-08-19 (×9): 1000 mg via ORAL
  Filled 2013-08-16 (×13): qty 2

## 2013-08-16 MED ORDER — BISMUTH SUBSALICYLATE 262 MG/15ML PO SUSP
30.0000 mL | Freq: Three times a day (TID) | ORAL | Status: DC | PRN
  Filled 2013-08-16: qty 236

## 2013-08-16 MED ORDER — MAGNESIUM SULFATE 40 MG/ML IJ SOLN
2.0000 g | Freq: Once | INTRAMUSCULAR | Status: AC
  Administered 2013-08-16: 2 g via INTRAVENOUS
  Filled 2013-08-16: qty 50

## 2013-08-16 MED ORDER — HYDROCORTISONE 2.5 % RE CREA
TOPICAL_CREAM | Freq: Four times a day (QID) | RECTAL | Status: DC | PRN
  Filled 2013-08-16: qty 28.35

## 2013-08-16 MED ORDER — WITCH HAZEL-GLYCERIN EX PADS
1.0000 "application " | MEDICATED_PAD | CUTANEOUS | Status: DC | PRN
  Filled 2013-08-16: qty 100

## 2013-08-16 MED ORDER — METHYLPREDNISOLONE 4 MG PO TABS
4.0000 mg | ORAL_TABLET | Freq: Every day | ORAL | Status: DC
  Administered 2013-08-16 – 2013-08-19 (×4): 4 mg via ORAL
  Filled 2013-08-16 (×5): qty 1

## 2013-08-16 MED ORDER — ADULT MULTIVITAMIN W/MINERALS CH
1.0000 | ORAL_TABLET | Freq: Every day | ORAL | Status: DC
  Administered 2013-08-16 – 2013-08-19 (×4): 1 via ORAL
  Filled 2013-08-16 (×4): qty 1

## 2013-08-16 NOTE — Progress Notes (Signed)
Subjective: TG pelvic drain placed 5/30 Pt up in room Tender at site Drain bag is missing  Objective: Vital signs in last 24 hours: Temp:  [97.2 F (36.2 C)-98.3 F (36.8 C)] 98.3 F (36.8 C) (05/30 2240) Pulse Rate:  [67-80] 67 (05/30 2240) Resp:  [18-20] 18 (05/30 2240) BP: (112-145)/(55-87) 123/63 mmHg (05/30 2240) SpO2:  [93 %-100 %] 98 % (05/30 2240) Last BM Date: 08/15/13  Intake/Output from previous day: 05/30 0701 - 05/31 0700 In: 2924.8 [P.O.:422; I.V.:1648.8; Blood:639; IV Piggyback:200] Out: 25 [Drains:25] Intake/Output this shift: Total I/O In: 222 [P.O.:222] Out: -   PE:  afeb vss Site clean and dry Wbc wnl Drain bag has been removed-- drain cap in place RN instructed to flush and replace bag IR sending bag  Lab Results:   Recent Labs  08/15/13 0600 08/16/13 0505  WBC 5.1 7.1  HGB 6.6* 9.4*  HCT 21.2* 27.8*  PLT 375 302   BMET  Recent Labs  08/15/13 0600 08/16/13 0505  NA 146 145  K 2.6* 3.8  CL 112 112  CO2 22 24  GLUCOSE 83 84  BUN 4* 4*  CREATININE 0.58 0.58  CALCIUM 8.3* 8.5   PT/INR  Recent Labs  08/14/13 0806 08/15/13 0600  LABPROT 20.4* 17.0*  INR 1.80* 1.42   ABG No results found for this basename: PHART, PCO2, PO2, HCO3,  in the last 72 hours  Studies/Results: Ct Image Guided Drainage By Percutaneous Catheter  08/15/2013   INDICATION: Diverticular abscess. Please attempted CT-guided drainage of the dominant collection located within the lower pelvis.  EXAM: CT IMAGE GUIDED DRAINAGE BY PERCUTANEOUS CATHETER  COMPARISON:  CT the abdomen pelvis - 08/13/2013  MEDICATIONS: The patient is currently admitted to the hospital and receiving intravenous antibiotics. The antibiotics were administered within an appropriate time frame prior to the initiation of the procedure.  ANESTHESIA/SEDATION: Fentanyl 75 mcg IV; Versed 2 mg IV  Total Moderate Sedation time  25 minutes  CONTRAST:  None  COMPLICATIONS: None immediate  PROCEDURE:  Informed written consent was obtained from the patient after a discussion of the risks, benefits and alternatives to treatment. The patient was placed prone on the CT gantry and a pre procedural CT was performed re-demonstrating the known abdominal abscess/fluid collection within the lower pelvis, which appeared minimally decreased in size in the interval, currently measuring approximately 2.9 x 1.9 cm (image 40, series 2), previously, 4.8 x 3.1 cm.  Of note, the additional smaller diverticular abscesses within the left hemipelvis also appear to have decreased in size in interval measuring approximately 1.8 x 3.2 cm, previously, 1.9 x 4.5 cm, and approximately 1.2 x 2.3 cm, previously 3.5 x 2.7 cm (both smaller abscesses seen on image 30, series 2).  The decision was made to perform CT guided drainage of the residual dominant collection within the lower pelvis. The procedure was planned. A timeout was performed prior to the initiation of the procedure.  The left buttocks was prepped and draped in the usual sterile fashion. The overlying soft tissues were anesthetized with 1% lidocaine with epinephrine. Appropriate trajectory was planned with the use of a 22 gauge spinal needle. An 18 gauge trocar needle was advanced into the abscess/fluid collection and a short Amplatz super stiff wire was coiled within the collection. Appropriate positioning was confirmed with a limited CT scan. The tract was serially dilated allowing placement of a 10 [place] all-purpose drainage catheter. Appropriate positioning was confirmed with a limited postprocedural CT scan.  Approximately 5  cc of purulent fluid was aspirated. The tube was connected to a drainage bag and sutured in place. A dressing was placed. The patient tolerated the procedure well without immediate post procedural complication.  IMPRESSION: 1. Successful CT guided placement of a 10 [place] all purpose drain catheter into the dominant diverticular abscess within the lower  pelvis via a left trans gluteal approach with aspiration of 5 mL of purulent fluid. Samples were sent to the laboratory as requested by the ordering clinical team. 2. The two additional smaller diverticular abscesses with the left hemipelvis have decreased in size and are too small to allow for percutaneous drainage.  PLAN: - Pending drain output, would recommend followup pelvic CT with oral and IV contrast in approximately 5-7 days.  - Recommend fluoroscopic drainage catheter injection prior to removal.   Electronically Signed   By: Sandi Mariscal M.D.   On: 08/15/2013 11:07    Anti-infectives: Anti-infectives   Start     Dose/Rate Route Frequency Ordered Stop   08/13/13 2200  ertapenem (INVANZ) 1 g in sodium chloride 0.9 % 50 mL IVPB     1 g 100 mL/hr over 30 Minutes Intravenous Every 24 hours 08/13/13 1736     08/13/13 1430  piperacillin-tazobactam (ZOSYN) IVPB 3.375 g     3.375 g 100 mL/hr over 30 Minutes Intravenous  Once 08/13/13 1415 08/13/13 1611      Assessment/Plan: s/p * No surgery found *  TG drain intact Bag missing---IR sending replacement RN to flush and replace to drain bag Will follow   LOS: 3 days    Lavonia Drafts 08/16/2013

## 2013-08-16 NOTE — Progress Notes (Signed)
PATIENT DETAILS Name: Ashley [name] Age: 80 y.o. Sex: female Date of Birth: 06-18-1928 Admit Date: 08/13/2013 Admitting Physician No admitting provider for patient encounter. AYT:KZSWFUX,NATFT L, MD  Subjective: No major complaints-wants to go home  Assessment/Plan: Principal Problem:   Diverticulitis of large intestine with abscess without bleeding -admitted and started on Invanz since 5/28, Gen surgery and IR consutled. Underwent CT guided drainage catheter placement and drainage of the abscess on 5/30. Currently on full liquids-advanced to bland diet today, per [facility] plan to repeat CT Abd in about 2-3 days from 5/30.Remains afebrile and with no Leukocytosis.  Active Problems: Anemia -suspect anemia of acute illness, and ?slow GI bleed -transfuse 2 units of PRBC on 5/30-recheck in am    Atrial fibrillation -stable -coumadin on hold-infact reversed with Vit K-given anemia-continue to keep on hold-if Hb holds-suspect we can resume anti-coagulation in the next few days. Suspect we can just switch her to Eliquis-if ok with patient/insurance etc.  Hypokalemia -normalized, recheck periodically  Hypomagnesemia -replete and recheck  GERD  protonix.   Rheumatoid Arthritis  Complains of pain in her hands and legs.  Holding DMARDs (Sulfasalazine/Leflunomide) for now-given ongoing infection/abscess. Will resume Methylprednisone as she is on this long term  Dyslipidemia -resume Fibrates  HTN -BP controlled without the need for anti-hypertensive. HCTZ remains on hold  H/O RCC  S/p nephrectomy  Creatinine is normal.  Protein-calorie malnutrition, severe -in the context of chronic illness -supplements with diet advanced further  Underweight  Disposition: Remain inpatient  DVT Prophylaxis: SCD's  Code Status: Full code   Family Communication Son and spouse at bedside on 5/30  Procedures:  5/30-Underwent CT guided drainage catheter placement and drainage of  the abscess.  CONSULTS:  general surgery  MEDICATIONS: Scheduled Meds: . acetaminophen  1,000 mg Oral TID  . cycloSPORINE  1 drop Both Eyes BID  . ertapenem  1 g Intravenous Q24H  . ketorolac  1 drop Both Eyes QID  . latanoprost  1 drop Both Eyes QHS  . lip balm  1 application Topical BID  . multivitamin  1 capsule Oral Daily  . polyvinyl alcohol  1 drop Both Eyes BID  . saccharomyces boulardii  250 mg Oral BID  . sodium chloride  10-40 mL Intracatheter Q12H   Continuous Infusions: . sodium chloride 0.9 % 1,000 mL with potassium chloride 20 mEq infusion 75 mL/hr at 08/16/13 0304   PRN Meds:.alum & mag hydroxide-simeth, bismuth subsalicylate, hydrocortisone-pramoxine, HYDROmorphone (DILAUDID) injection, magic mouthwash, ondansetron (ZOFRAN) IV, ondansetron, sodium chloride, witch hazel-glycerin  Antibiotics: Anti-infectives   Start     Dose/Rate Route Frequency Ordered Stop   08/13/13 2200  ertapenem (INVANZ) 1 g in sodium chloride 0.9 % 50 mL IVPB     1 g 100 mL/hr over 30 Minutes Intravenous Every 24 hours 08/13/13 1736     08/13/13 1430  piperacillin-tazobactam (ZOSYN) IVPB 3.375 g     3.375 g 100 mL/hr over 30 Minutes Intravenous  Once 08/13/13 1415 08/13/13 1611       PHYSICAL EXAM: Vital signs in last 24 hours: Filed Vitals:   08/15/13 2000 08/15/13 2100 08/15/13 2200 08/15/13 2240  BP: 139/59 112/87 130/80 123/63  Pulse: 74 71 73 67  Temp: 97.8 F (36.6 C) 98.2 F (36.8 C) 98.1 F (36.7 C) 98.3 F (36.8 C)  TempSrc: Oral Oral Oral Oral  Resp: 20 18 18 18   Height:      Weight:  SpO2: 96% 97% 99% 98%    Weight change:  Filed Weights   08/13/13 1305 08/13/13 1823  Weight: 48.535 kg (107 lb) 48.308 kg (106 lb 8 oz)   Body mass index is 17.72 kg/(m^2).   Gen Exam: Awake and alert with clear speech.   Neck: Supple, No JVD.   Chest: B/L Clear.   CVS: S1 S2 Regular, no murmurs.  Abdomen: soft, BS +, tender in LLQ area, non distended.  Extremities:  no edema, lower extremities warm to touch. Neurologic: Non Focal.   Skin: No Rash.   Wounds: N/A.   Intake/Output from previous day:  Intake/Output Summary (Last 24 hours) at 08/16/13 1159 Last data filed at 08/16/13 1055  Gross per 24 hour  Intake 3351.75 ml  Output     25 ml  Net 3326.75 ml     LAB RESULTS: CBC  Recent Labs Lab 08/13/13 1413 08/14/13 0806 08/15/13 0600 08/16/13 0505  WBC 9.9 8.1 5.1 7.1  HGB 8.4* 7.5* 6.6* 9.4*  HCT 25.9* 23.0* 21.2* 27.8*  PLT 407* 420* 375 302  MCV 94.2 95.4 96.8 90.6  MCH 30.5 31.1 30.1 30.6  MCHC 32.4 32.6 31.1 33.8  RDW 18.7* 19.1* 19.0* 18.0*  LYMPHSABS 1.5  --   --   --   MONOABS 1.1*  --   --   --   EOSABS 0.0  --   --   --   BASOSABS 0.0  --   --   --     Chemistries   Recent Labs Lab 08/13/13 1413 08/15/13 0600 08/16/13 0505  NA 138 146 145  K 3.7 2.6* 3.8  CL 101 112 112  CO2 22 22 24   GLUCOSE 92 83 84  BUN 9 4* 4*  CREATININE 0.69 0.58 0.58  CALCIUM 9.8 8.3* 8.5  MG  --   --  1.4*    CBG: No results found for this basename: GLUCAP,  in the last 168 hours  GFR Estimated Creatinine Clearance: 41.3 ml/min (by C-G formula based on Cr of 0.58).  Coagulation profile  Recent Labs Lab 08/13/13 1413 08/14/13 0806 08/15/13 0600  INR 2.20* 1.80* 1.42    Cardiac Enzymes No results found for this basename: CK, CKMB, TROPONINI, MYOGLOBIN,  in the last 168 hours  No components found with this basename: POCBNP,  No results found for this basename: DDIMER,  in the last 72 hours No results found for this basename: HGBA1C,  in the last 72 hours No results found for this basename: CHOL, HDL, LDLCALC, TRIG, CHOLHDL, LDLDIRECT,  in the last 72 hours No results found for this basename: TSH, T4TOTAL, FREET3, T3FREE, THYROIDAB,  in the last 72 hours  Recent Labs  08/14/13 0806  VITAMINB12 489  FOLATE 12.5  FERRITIN 654*  TIBC 220*  IRON 46   No results found for this basename: LIPASE, AMYLASE,  in the  last 72 hours  Urine Studies No results found for this basename: UACOL, UAPR, USPG, UPH, UTP, UGL, UKET, UBIL, UHGB, UNIT, UROB, ULEU, UEPI, UWBC, URBC, UBAC, CAST, CRYS, UCOM, BILUA,  in the last 72 hours  MICROBIOLOGY: Recent Results (from the past 240 hour(s))  CULTURE, ROUTINE-ABSCESS     Status: None   Collection Time    08/15/13 10:33 AM      Result Value Ref Range Status   Specimen Description ABSCESS   Final   Special Requests DIVERTICULAR ABSCESS POST PERC DRAINAGE   Final   Gram Stain PENDING  Incomplete   Culture     Final   Value: NO GROWTH 1 DAY     Performed at [facility]   Report Status PENDING   Incomplete    RADIOLOGY STUDIES/RESULTS: Ct Head Wo Contrast  08/13/2013   CLINICAL DATA:  Previous traumatic injury, nausea  EXAM: CT HEAD WITHOUT CONTRAST  TECHNIQUE: Contiguous axial images were obtained from the base of the skull through the vertex without intravenous contrast.  COMPARISON:  None.  FINDINGS: The bony calvarium is intact. Soft tissue hematoma is noted in the left frontal region consistent with a recent history. Changes consistent with prior left frontal craniotomy are noted. Septal malacia changes are noted secondary to the previous craniotomy also in the left frontal region. Mild atrophic changes are seen. No findings to suggest acute hemorrhage, acute infarction or space-occupying mass lesion are noted. Ex vacuo dilatation of the left lateral ventricle is noted.  IMPRESSION: Small left frontal soft tissue scalp hematoma.  Chronic changes as described.   Electronically Signed   By: Inez Catalina M.D.   On: 08/13/2013 14:57   Ct Abdomen Pelvis W Contrast  08/13/2013   CLINICAL DATA:  Abdominal pain with nausea and weight loss. History of renal cell carcinoma.  EXAM: CT ABDOMEN AND PELVIS WITH CONTRAST  TECHNIQUE: Multidetector CT imaging of the abdomen and pelvis was performed using the standard protocol following bolus administration of intravenous  contrast.  CONTRAST:  75 ml Omnipaque 300.  COMPARISON:  Abdominal pelvic CT 05/13/2012.  FINDINGS: There is stable scarring at the right lung base, including a nodular component in the right lower lobe measuring 1.4 cm on image 17. There is no significant pleural or pericardial effusion.  The gallbladder is surgically absent. There is no significant biliary dilatation. The liver and spleen appear unremarkable. The pancreas is diffusely atrophied without focal abnormality.  The right kidney is surgically absent. There is no mass within the nephrectomy bed. The left kidney appears normal. Both adrenal glands appear normal.  The stomach, small bowel and proximal colon demonstrate no significant findings. There is extensive sigmoid colon diverticulitis with wall thickening and surrounding inflammation. There are multiple extraluminal air fluid collections surrounding the sigmoid colon. These measure 3.5 x 2.7 cm along the left aspect of the proximal sigmoid colon (image 74) and 4.5 x 1.9 cm more distally along the left aspect of the sigmoid colon. Within the pelvic cul-de-sac, there is a 3.1 x 4.8 cm air-fluid collection on image 77. The contrast material has not yet passed through the sigmoid colon. The uterus and ovaries appear normal. The bladder is nearly empty, but demonstrates no pneumatosis. A degree of pelvic floor relaxation is suspected. There is no evidence of bowel obstruction or pneumoperitoneum.  Chronic L1 compression deformity is unchanged. There are no worrisome osseous findings. Subchondral sclerosis anteriorly in the right femoral head is stable, potentially reflecting chronic avascular necrosis.  IMPRESSION: 1. Acute sigmoid diverticulitis with multiple pericolonic abscesses. 2. No evidence of free intraperitoneal air or bowel obstruction. 3. No evidence of metastatic renal cell carcinoma. The right nephrectomy bed and left kidney have stable appearances. 4. These results will be called to the  ordering clinician or representative by the Radiology Department at the imaging location. Patient has been detained on site for subsequent disposition.   Electronically Signed   By: Camie Patience M.D.   On: 08/13/2013 12:36   Ct Image Guided Drainage By Percutaneous Catheter  08/15/2013   INDICATION: Diverticular abscess. Please attempted  CT-guided drainage of the dominant collection located within the lower pelvis.  EXAM: CT IMAGE GUIDED DRAINAGE BY PERCUTANEOUS CATHETER  COMPARISON:  CT the abdomen pelvis - 08/13/2013  MEDICATIONS: The patient is currently admitted to the hospital and receiving intravenous antibiotics. The antibiotics were administered within an appropriate time frame prior to the initiation of the procedure.  ANESTHESIA/SEDATION: Fentanyl 75 mcg IV; Versed 2 mg IV  Total Moderate Sedation time  25 minutes  CONTRAST:  None  COMPLICATIONS: None immediate  PROCEDURE: Informed written consent was obtained from the patient after a discussion of the risks, benefits and alternatives to treatment. The patient was placed prone on the CT gantry and a pre procedural CT was performed re-demonstrating the known abdominal abscess/fluid collection within the lower pelvis, which appeared minimally decreased in size in the interval, currently measuring approximately 2.9 x 1.9 cm (image 40, series 2), previously, 4.8 x 3.1 cm.  Of note, the additional smaller diverticular abscesses within the left hemipelvis also appear to have decreased in size in interval measuring approximately 1.8 x 3.2 cm, previously, 1.9 x 4.5 cm, and approximately 1.2 x 2.3 cm, previously 3.5 x 2.7 cm (both smaller abscesses seen on image 30, series 2).  The decision was made to perform CT guided drainage of the residual dominant collection within the lower pelvis. The procedure was planned. A timeout was performed prior to the initiation of the procedure.  The left buttocks was prepped and draped in the usual sterile fashion. The  overlying soft tissues were anesthetized with 1% lidocaine with epinephrine. Appropriate trajectory was planned with the use of a 22 gauge spinal needle. An 18 gauge trocar needle was advanced into the abscess/fluid collection and a short Amplatz super stiff wire was coiled within the collection. Appropriate positioning was confirmed with a limited CT scan. The tract was serially dilated allowing placement of a 10 [place] all-purpose drainage catheter. Appropriate positioning was confirmed with a limited postprocedural CT scan.  Approximately 5 cc of purulent fluid was aspirated. The tube was connected to a drainage bag and sutured in place. A dressing was placed. The patient tolerated the procedure well without immediate post procedural complication.  IMPRESSION: 1. Successful CT guided placement of a 10 [place] all purpose drain catheter into the dominant diverticular abscess within the lower pelvis via a left trans gluteal approach with aspiration of 5 mL of purulent fluid. Samples were sent to the laboratory as requested by the ordering clinical team. 2. The two additional smaller diverticular abscesses with the left hemipelvis have decreased in size and are too small to allow for percutaneous drainage.  PLAN: - Pending drain output, would recommend followup pelvic CT with oral and IV contrast in approximately 5-7 days.  - Recommend fluoroscopic drainage catheter injection prior to removal.   Electronically Signed   By: Sandi Mariscal M.D.   On: 08/15/2013 11:07    Shanker Kristeen Mans, MD  [facility] Pager:[phone]  If 7PM-7AM, please contact night-coverage [email or web address] Password [email or web address] 08/16/2013, 11:59 AM   LOS: 3 days   **Disclaimer: This note may have been dictated with voice recognition software. Similar sounding words can inadvertently be transcribed and this note may contain transcription errors which may not have been corrected upon publication of note.**

## 2013-08-16 NOTE — Progress Notes (Signed)
RN notified of Patient's drain bag becoming unattached. IR PA aware and verbal order to flush drain and reattach new bag.

## 2013-08-16 NOTE — Progress Notes (Signed)
Subjective: 5 ml from drain placed yesterday.  She has lost her bag for the drain, so there is nothing to look at now.  She says she hurts in the LLQ.  Objective: Vital signs in last 24 hours: Temp:  [97.2 F (36.2 C)-98.3 F (36.8 C)] 98.3 F (36.8 C) (05/30 2240) Pulse Rate:  [67-80] 67 (05/30 2240) Resp:  [18-20] 18 (05/30 2240) BP: (112-145)/(55-87) 123/63 mmHg (05/30 2240) SpO2:  [93 %-100 %] 98 % (05/30 2240) Last BM Date: 08/15/13 PO 422 ml recorded. 6 stools recorded yesterday and l this AM  No VS since 10 PM last evening Urine culture pending CBC/BMp is better. MAG is low Full liquid diet. Intake/Output from previous day: 05/30 0701 - 05/31 0700 In: 2924.8 [P.O.:422; I.V.:1648.8; Blood:639; IV Piggyback:200] Out: 25 [Drains:25] Intake/Output this shift: Total I/O In: 222 [P.O.:222] Out: -   General appearance: alert, cooperative, no distress and she is actually up taking a bath. GI: soft sore in LLQ, + BS  Lab Results:   Recent Labs  08/15/13 0600 08/16/13 0505  WBC 5.1 7.1  HGB 6.6* 9.4*  HCT 21.2* 27.8*  PLT 375 302    BMET  Recent Labs  08/15/13 0600 08/16/13 0505  NA 146 145  K 2.6* 3.8  CL 112 112  CO2 22 24  GLUCOSE 83 84  BUN 4* 4*  CREATININE 0.58 0.58  CALCIUM 8.3* 8.5   PT/INR  Recent Labs  08/14/13 0806 08/15/13 0600  LABPROT 20.4* 17.0*  INR 1.80* 1.42     Recent Labs Lab 08/13/13 1413 08/15/13 0600  AST 32 20  ALT 11 7  ALKPHOS 59 44  BILITOT 0.6 0.4  PROT 7.1 4.9*  ALBUMIN 2.6* 1.9*     Lipase  No results found for this basename: lipase     Studies/Results: Ct Image Guided Drainage By Percutaneous Catheter  08/15/2013   INDICATION: Diverticular abscess. Please attempted CT-guided drainage of the dominant collection located within the lower pelvis.  EXAM: CT IMAGE GUIDED DRAINAGE BY PERCUTANEOUS CATHETER  COMPARISON:  CT the abdomen pelvis - 08/13/2013  MEDICATIONS: The patient is currently admitted to  the hospital and receiving intravenous antibiotics. The antibiotics were administered within an appropriate time frame prior to the initiation of the procedure.  ANESTHESIA/SEDATION: Fentanyl 75 mcg IV; Versed 2 mg IV  Total Moderate Sedation time  25 minutes  CONTRAST:  None  COMPLICATIONS: None immediate  PROCEDURE: Informed written consent was obtained from the patient after a discussion of the risks, benefits and alternatives to treatment. The patient was placed prone on the CT gantry and a pre procedural CT was performed re-demonstrating the known abdominal abscess/fluid collection within the lower pelvis, which appeared minimally decreased in size in the interval, currently measuring approximately 2.9 x 1.9 cm (image 40, series 2), previously, 4.8 x 3.1 cm.  Of note, the additional smaller diverticular abscesses within the left hemipelvis also appear to have decreased in size in interval measuring approximately 1.8 x 3.2 cm, previously, 1.9 x 4.5 cm, and approximately 1.2 x 2.3 cm, previously 3.5 x 2.7 cm (both smaller abscesses seen on image 30, series 2).  The decision was made to perform CT guided drainage of the residual dominant collection within the lower pelvis. The procedure was planned. A timeout was performed prior to the initiation of the procedure.  The left buttocks was prepped and draped in the usual sterile fashion. The overlying soft tissues were anesthetized with 1% lidocaine with epinephrine. Appropriate  trajectory was planned with the use of a 22 gauge spinal needle. An 18 gauge trocar needle was advanced into the abscess/fluid collection and a short Amplatz super stiff wire was coiled within the collection. Appropriate positioning was confirmed with a limited CT scan. The tract was serially dilated allowing placement of a 10 [place] all-purpose drainage catheter. Appropriate positioning was confirmed with a limited postprocedural CT scan.  Approximately 5 cc of purulent fluid was aspirated.  The tube was connected to a drainage bag and sutured in place. A dressing was placed. The patient tolerated the procedure well without immediate post procedural complication.  IMPRESSION: 1. Successful CT guided placement of a 10 [place] all purpose drain catheter into the dominant diverticular abscess within the lower pelvis via a left trans gluteal approach with aspiration of 5 mL of purulent fluid. Samples were sent to the laboratory as requested by the ordering clinical team. 2. The two additional smaller diverticular abscesses with the left hemipelvis have decreased in size and are too small to allow for percutaneous drainage.  PLAN: - Pending drain output, would recommend followup pelvic CT with oral and IV contrast in approximately 5-7 days.  - Recommend fluoroscopic drainage catheter injection prior to removal.   Electronically Signed   By: Sandi Mariscal M.D.   On: 08/15/2013 11:07    Medications: . cycloSPORINE  1 drop Both Eyes BID  . ertapenem  1 g Intravenous Q24H  . ketorolac  1 drop Both Eyes QID  . latanoprost  1 drop Both Eyes QHS  . pantoprazole (PROTONIX) IV  40 mg Intravenous Q24H  . polyethylene glycol  17 g Oral Daily  . polyvinyl alcohol  1 drop Both Eyes BID  . sodium chloride  10-40 mL Intracatheter Q12H    Assessment/Plan 1. Sigmoid diverticulitis with several areas of extraluminal air fluid collections. 3.5 x 2.7, 4.5 x 1.9 and 3.1 x 4.8 cm fluid collections  2. Multiple medical comorbidities  3. Anticoagulation  4. Anemia - chronic vs. Slow lower GI bleed  5. ?urinary tract infection  6. Hx renal cell CA s/p right nephrectomy  7. PCM  8. S/p left nephrectomy, lobectomy, brain meningioma excision, left eye for melonoma    Plan:  I have ask nurse to get IR to bring up an new bag for the drain if they cannot find the other one.  Ask IR to irrigate and check the drain.   I would not advance her diet at this point. Replace Mag per Medicine.  LOS: 3 days    Earnstine Regal 08/16/2013  Tol fulls Mild TTP TTP - no peritonitis No bag on now  -cont drainage -IV ABX -OK to try bland diet -bowel regimen w diarrhea  Adin Hector, M.D., F.A.C.S. Gastrointestinal and Minimally Invasive Surgery [facility] [address] [place], [place] [address] [phone] Main / Paging

## 2013-08-17 DIAGNOSIS — K5732 Diverticulitis of large intestine without perforation or abscess without bleeding: Secondary | ICD-10-CM | POA: Diagnosis not present

## 2013-08-17 DIAGNOSIS — E43 Unspecified severe protein-calorie malnutrition: Secondary | ICD-10-CM | POA: Diagnosis not present

## 2013-08-17 DIAGNOSIS — D649 Anemia, unspecified: Secondary | ICD-10-CM | POA: Diagnosis not present

## 2013-08-17 DIAGNOSIS — K630 Abscess of intestine: Secondary | ICD-10-CM | POA: Diagnosis not present

## 2013-08-17 LAB — BASIC METABOLIC PANEL
BUN: 6 mg/dL (ref 6–23)
CO2: 23 mEq/L (ref 19–32)
CREATININE: 0.57 mg/dL (ref 0.50–1.10)
Calcium: 8.8 mg/dL (ref 8.4–10.5)
Chloride: 114 mEq/L — ABNORMAL HIGH (ref 96–112)
GFR calc Af Amer: >90 mL/min (ref >90)
GFR, EST NON AFRICAN AMERICAN: 84 mL/min — AB (ref >90)
GLUCOSE: 77 mg/dL (ref 70–99)
Potassium: 3.9 mEq/L (ref 3.7–5.3)
Sodium: 146 mEq/L (ref 137–147)

## 2013-08-17 LAB — CBC
HEMATOCRIT: 28.1 % — AB (ref 36.0–46.0)
Hemoglobin: 9.4 g/dL — ABNORMAL LOW (ref 12.0–15.0)
MCH: 30.5 pg (ref 26.0–34.0)
MCHC: 33.5 g/dL (ref 30.0–36.0)
MCV: 91.2 fL (ref 78.0–100.0)
Platelets: 291 10*3/uL (ref 150–400)
RBC: 3.08 MIL/uL — ABNORMAL LOW (ref 3.87–5.11)
RDW: 18.9 % — ABNORMAL HIGH (ref 11.5–15.5)
WBC: 5 10*3/uL (ref 4.0–10.5)

## 2013-08-17 LAB — PHOSPHORUS: Phosphorus: 2.2 mg/dL — ABNORMAL LOW (ref 2.3–4.6)

## 2013-08-17 MED ORDER — ENSURE COMPLETE PO LIQD
237.0000 mL | Freq: Two times a day (BID) | ORAL | Status: DC
  Administered 2013-08-17 – 2013-08-18 (×2): 237 mL via ORAL

## 2013-08-17 NOTE — Progress Notes (Signed)
  Subjective: TG pelvic abscess drain placed 5/30 Up in chair Better today Home soon with drain per [facility] note  Objective: Vital signs in last 24 hours: Temp:  [97.9 F (36.6 C)-98.5 F (36.9 C)] 98.2 F (36.8 C) (06/01 0500) Pulse Rate:  [60-93] 60 (06/01 0500) Resp:  [18] 18 (06/01 0500) BP: (126-144)/(71-90) 126/71 mmHg (06/01 0500) SpO2:  [94 %-99 %] 99 % (06/01 0500) Last BM Date: 08/16/13  Intake/Output from previous day: 05/31 0701 - 06/01 0700 In: 1264 [P.O.:1104; IV Piggyback:100] Out: 0  Intake/Output this shift:   PE:  Afeb; vss Output minimal 10 cc in bag now: purulent Site clean and dry; sl  tender Wbc wnl Cx mult orgs   Lab Results:   Recent Labs  08/16/13 0505 08/17/13 0510  WBC 7.1 5.0  HGB 9.4* 9.4*  HCT 27.8* 28.1*  PLT 302 291   BMET  Recent Labs  08/16/13 0505 08/17/13 0510  NA 145 146  K 3.8 3.9  CL 112 114*  CO2 24 23  GLUCOSE 84 77  BUN 4* 6  CREATININE 0.58 0.57  CALCIUM 8.5 8.8   PT/INR  Recent Labs  08/15/13 0600  LABPROT 17.0*  INR 1.42   ABG No results found for this basename: PHART, PCO2, PO2, HCO3,  in the last 72 hours  Studies/Results: No results found.  Anti-infectives: Anti-infectives   Start     Dose/Rate Route Frequency Ordered Stop   08/13/13 2200  ertapenem (INVANZ) 1 g in sodium chloride 0.9 % 50 mL IVPB     1 g 100 mL/hr over 30 Minutes Intravenous Every 24 hours 08/13/13 1736     08/13/13 1430  piperacillin-tazobactam (ZOSYN) IVPB 3.375 g     3.375 g 100 mL/hr over 30 Minutes Intravenous  Once 08/13/13 1415 08/13/13 1611      Assessment/Plan: s/p * No surgery found *  Diverticular abscess Pelvic drain (TG) placed 5/30 prob dc to home with drain soon per [facility] note   LOS: 4 days    Lavonia Drafts 08/17/2013

## 2013-08-17 NOTE — Progress Notes (Addendum)
Patient seen and examined this morning. Denies any abdominal pain. Tolerating advanced diet. Reported loose bowel movements yesterday. Plan for repeat CT scan of the abdomen tomorrow morning. If stable plan on discharging her home with the drain tomorrow. Patient on empiric Invanz, cx so far negative. If no growth seen will switch to oral cipro and flagyl to complete a 2 week course. Pt off coumadin for  afib this admission . CM working to see if [employer] covers for newer anticoagulant like eliquis.

## 2013-08-17 NOTE — Progress Notes (Signed)
Subjective: Abdomen is still sore and somewhat tender.  Having allot of stools.    Objective: Vital signs in last 24 hours: Temp:  [97.9 F (36.6 C)-98.5 F (36.9 C)] 98.2 F (36.8 C) (06/01 0500) Pulse Rate:  [60-93] 60 (06/01 0500) Resp:  [18] 18 (06/01 0500) BP: (126-144)/(71-90) 126/71 mmHg (06/01 0500) SpO2:  [94 %-99 %] 99 % (06/01 0500) Last BM Date: 08/16/13 1104 PO recorded Nothing from the drain 9 stools Afebrile, VSS Multiple organism on culture pending. He has had 4 days of Invanz  Intake/Output from previous day: 05/31 0701 - 06/01 0700 In: 1264 [P.O.:1104; IV Piggyback:100] Out: 0  Intake/Output this shift:    General appearance: alert, cooperative and no distress GI: soft, she is comfortable sitting up but still complains of pain, nothing in the drain bags +BS, she cannot remember what she has eaten.  Lab Results:   Recent Labs  08/16/13 0505 08/17/13 0510  WBC 7.1 5.0  HGB 9.4* 9.4*  HCT 27.8* 28.1*  PLT 302 291    BMET  Recent Labs  08/16/13 0505 08/17/13 0510  NA 145 146  K 3.8 3.9  CL 112 114*  CO2 24 23  GLUCOSE 84 77  BUN 4* 6  CREATININE 0.58 0.57  CALCIUM 8.5 8.8   PT/INR  Recent Labs  08/15/13 0600  LABPROT 17.0*  INR 1.42     Recent Labs Lab 08/13/13 1413 08/15/13 0600  AST 32 20  ALT 11 7  ALKPHOS 59 44  BILITOT 0.6 0.4  PROT 7.1 4.9*  ALBUMIN 2.6* 1.9*     Lipase  No results found for this basename: lipase     Studies/Results: Ct Image Guided Drainage By Percutaneous Catheter  08/15/2013   INDICATION: Diverticular abscess. Please attempted CT-guided drainage of the dominant collection located within the lower pelvis.  EXAM: CT IMAGE GUIDED DRAINAGE BY PERCUTANEOUS CATHETER  COMPARISON:  CT the abdomen pelvis - 08/13/2013  MEDICATIONS: The patient is currently admitted to the hospital and receiving intravenous antibiotics. The antibiotics were administered within an appropriate time frame prior to the  initiation of the procedure.  ANESTHESIA/SEDATION: Fentanyl 75 mcg IV; Versed 2 mg IV  Total Moderate Sedation time  25 minutes  CONTRAST:  None  COMPLICATIONS: None immediate  PROCEDURE: Informed written consent was obtained from the patient after a discussion of the risks, benefits and alternatives to treatment. The patient was placed prone on the CT gantry and a pre procedural CT was performed re-demonstrating the known abdominal abscess/fluid collection within the lower pelvis, which appeared minimally decreased in size in the interval, currently measuring approximately 2.9 x 1.9 cm (image 40, series 2), previously, 4.8 x 3.1 cm.  Of note, the additional smaller diverticular abscesses within the left hemipelvis also appear to have decreased in size in interval measuring approximately 1.8 x 3.2 cm, previously, 1.9 x 4.5 cm, and approximately 1.2 x 2.3 cm, previously 3.5 x 2.7 cm (both smaller abscesses seen on image 30, series 2).  The decision was made to perform CT guided drainage of the residual dominant collection within the lower pelvis. The procedure was planned. A timeout was performed prior to the initiation of the procedure.  The left buttocks was prepped and draped in the usual sterile fashion. The overlying soft tissues were anesthetized with 1% lidocaine with epinephrine. Appropriate trajectory was planned with the use of a 22 gauge spinal needle. An 18 gauge trocar needle was advanced into the abscess/fluid collection and a short  Amplatz super stiff wire was coiled within the collection. Appropriate positioning was confirmed with a limited CT scan. The tract was serially dilated allowing placement of a 10 [place] all-purpose drainage catheter. Appropriate positioning was confirmed with a limited postprocedural CT scan.  Approximately 5 cc of purulent fluid was aspirated. The tube was connected to a drainage bag and sutured in place. A dressing was placed. The patient tolerated the procedure well  without immediate post procedural complication.  IMPRESSION: 1. Successful CT guided placement of a 10 [place] all purpose drain catheter into the dominant diverticular abscess within the lower pelvis via a left trans gluteal approach with aspiration of 5 mL of purulent fluid. Samples were sent to the laboratory as requested by the ordering clinical team. 2. The two additional smaller diverticular abscesses with the left hemipelvis have decreased in size and are too small to allow for percutaneous drainage.  PLAN: - Pending drain output, would recommend followup pelvic CT with oral and IV contrast in approximately 5-7 days.  - Recommend fluoroscopic drainage catheter injection prior to removal.   Electronically Signed   By: Sandi Mariscal M.D.   On: 08/15/2013 11:07    Medications: . acetaminophen  1,000 mg Oral TID  . cycloSPORINE  1 drop Both Eyes BID  . ertapenem  1 g Intravenous Q24H  . fenofibrate  54 mg Oral Daily  . ketorolac  1 drop Both Eyes QID  . latanoprost  1 drop Both Eyes QHS  . lip balm  1 application Topical BID  . methylPREDNISolone  4 mg Oral QAC breakfast  . multivitamin with minerals  1 tablet Oral Daily  . polyvinyl alcohol  1 drop Both Eyes BID  . saccharomyces boulardii  250 mg Oral BID  . sodium chloride  10-40 mL Intracatheter Q12H    Assessment/Plan 1. Sigmoid diverticulitis with several areas of extraluminal air fluid collections. 3.5 x 2.7, 4.5 x 1.9 and 3.1 x 4.8 cm fluid collections  2. Multiple medical comorbidities  3. Anticoagulation  4. Anemia - chronic vs. Slow lower GI bleed  5. ?urinary tract infection  6. Hx renal cell CA s/p right nephrectomy  7. PCM  8. S/p left nephrectomy, lobectomy, brain meningioma excision, left eye for melonoma    Plan:   She is on a bland diet, I am going to check for C diff just to be sure. She has had 4 days of Invanz, today will be day 5.  LOS: 4 days    Ashley [name] 08/17/2013

## 2013-08-17 NOTE — Evaluation (Addendum)
Occupational Therapy Evaluation Patient Details Name: Ashley [name] MRN: [id number] DOB: 02/10/1929 Today's Date: 08/17/2013    History of Present Illness 80 y.o. female Sent to ED after getting an outpatient CT which showed diverticulitis and multiple abscesses.   Clinical Impression   Pt admitted with above. Pt independent with ADLs, PTA. Feel pt will benefit from acute OT to increase independence prior to d/c.    Follow Up Recommendations  No OT follow up;Supervision/Assistance - 24 hour    Equipment Recommendations  None recommended by OT    Recommendations for Other Services       Precautions / Restrictions Precautions Precautions: Fall Precaution Comments: poor vision Restrictions Weight Bearing Restrictions: No      Mobility Bed Mobility Overal bed mobility: Modified Independent                Transfers Overall transfer level: Needs assistance   Transfers: Sit to/from Stand Sit to Stand: Min assist;Min guard         General transfer comment: cues for technique/walker. Assistance to rise from toilet.    Balance                                            ADL Overall ADL's : Needs assistance/impaired     Grooming: Wash/dry hands;Standing;Minimal assistance (assistance with soap dispenser)           Upper Body Dressing : Set up;Sitting   Lower Body Dressing: Min guard;Sit to/from stand   Toilet Transfer: Minimal assistance;Ambulation;Regular Toilet;Grab bars;RW   Toileting- Water quality scientist and Hygiene: Min guard;Sit to/from stand       Functional mobility during ADLs: Minimal assistance;Rolling walker;Cane General ADL Comments: Educated on safe shoewear, use of bag on walker, and discussed rugs. Recommended sitting for most of bathing/dressing. Recommended spouse be with pt for tub transfer. Educated on tub transfer technique. Ambulated with cane and walker-more stable with walker and recommended pt use this.      Vision                     Perception     Praxis      Pertinent Vitals/Pain Pain in abdomen, but not rated.      Hand Dominance     Extremity/Trunk Assessment Upper Extremity Assessment Upper Extremity Assessment: Generalized weakness   Lower Extremity Assessment Lower Extremity Assessment: Defer to PT evaluation       Communication Communication Communication: No difficulties   Cognition Arousal/Alertness: Awake/alert Behavior During Therapy: WFL for tasks assessed/performed Overall Cognitive Status: Within Functional Limits for tasks assessed                     General Comments       Exercises       Shoulder Instructions      Home Living Family/patient expects to be discharged to:: Private residence Living Arrangements: Spouse/significant other Available Help at Discharge: Available 24 hours/day Type of Home: House Home Access: Stairs to enter [employer] of Steps: 6 Entrance Stairs-Rails: Right;Left;Can reach both Home Layout: One level     Bathroom Shower/Tub: Tub/shower unit;Walk-in shower   Bathroom Toilet: Handicapped height Bathroom Accessibility: Yes How Accessible: Accessible via walker Home Equipment: Cane - single point;Shower seat;Grab bars - toilet;Grab bars - tub/shower (has walker)          Prior Functioning/Environment Level  of Independence: Needs assistance  Gait / Transfers Assistance Needed: assist for stairs          OT Diagnosis: Acute pain;Generalized weakness   OT Problem List: Decreased strength;Impaired balance (sitting and/or standing);Decreased knowledge of use of DME or AE;Decreased knowledge of precautions;Pain;Impaired vision/perception   OT Treatment/Interventions: Self-care/ADL training;Therapeutic exercise;DME and/or AE instruction;Therapeutic activities;Patient/family education;Balance training;Visual/perceptual remediation/compensation    OT Goals(Current goals can be found  in the care plan section) Acute Rehab OT Goals Patient Stated Goal: not stated OT Goal Formulation: With patient Time For Goal Achievement: 08/24/13 Potential to Achieve Goals: Good ADL Goals Pt Will Perform Lower Body Dressing: with modified independence;sit to/from stand (including gathering supplies) Pt Will Transfer to Toilet: with modified independence;ambulating;grab bars (elevated commode) Pt Will Perform Toileting - Clothing Manipulation and hygiene: with modified independence;sit to/from stand Pt Will Perform Tub/Shower Transfer: Tub transfer;with supervision;ambulating;shower seat;rolling walker Additional ADL Goal #1: Pt will independently perform HEP for UE's to increase strength.  OT Frequency: Min 2X/week   Barriers to D/C:            Co-evaluation              End of Session Equipment Utilized During Treatment: Gait belt;Rolling walker  Activity Tolerance: Patient tolerated treatment well Patient left: in bed;with call bell/phone within reach;with bed alarm set;with family/visitor present   Time: 8657-8469 OT Time Calculation (min): 23 min Charges:  OT General Charges $OT Visit: 1 Procedure OT Evaluation $Initial OT Evaluation Tier I: 1 Procedure OT Treatments $Self Care/Home Management : 8-22 mins G-Codes:    Benito Mccreedy OTR/L [phone] 08/17/2013, 3:11 PM

## 2013-08-17 NOTE — Progress Notes (Signed)
PATIENT DETAILS Name: Ashley [name] Age: 80 y.o. Sex: female Date of Birth: Feb 21, 1929 Admit Date: 08/13/2013 Admitting Physician No admitting provider for patient encounter. GMW:NUUVOZD,GUYQI L, MD  Subjective: Looking forward to discharge to home.  Assessment/Plan: Principal Problem:   Diverticulitis of large intestine with abscess without bleeding -admitted and started on Invanz since 5/28, Gen surgery and IR consutled. Underwent CT guided drainage catheter placement and drainage of the abscess on 5/30. Currently on full liquids-advanced to bland diet today, per CCS plan to repeat CT Abd in about 2-3 days from 5/30.Remains afebrile and with no Leukocytosis. Will re-order CT abdomen/pelvis for 6/2 am.  If patient is eating well may be able to go home on 6/2.  Anemia -suspect anemia of acute illness, and slow GI bleed.  Guiac negative. -transfuse 2 units of PRBC on 5/30-recheck in am    Atrial fibrillation -stable -coumadin on hold-infact reversed with Vit K-given anemia-continue to keep on hold-if Hb holds-suspect we can resume anti-coagulation in the next few days. Suspect we can just switch her to Eliquis-if ok with patient/insurance etc.  Hypokalemia -normalized, recheck periodically  Hypomagnesemia -replete and recheck  GERD  protonix.   Rheumatoid Arthritis  Complains of pain in her hands and legs.  Holding DMARDs (Sulfasalazine/Leflunomide) for now-given ongoing infection/abscess. Will resume Methylprednisone as she is on this long term  Dyslipidemia -resume Fibrates  HTN -BP controlled without the need for anti-hypertensive. HCTZ remains on hold  H/O RCC  S/p nephrectomy  Creatinine is normal.  Protein-calorie malnutrition, severe -in the context of chronic illness -supplements with diet advanced further  Underweight  Disposition: Remain inpatient  DVT Prophylaxis: SCD's  Code Status: Full code   Family Communication Son and spouse at  bedside on 5/30  Procedures:  5/30-Underwent CT guided drainage catheter placement and drainage of the abscess.  CONSULTS:  general surgery  MEDICATIONS: Scheduled Meds: . acetaminophen  1,000 mg Oral TID  . cycloSPORINE  1 drop Both Eyes BID  . ertapenem  1 g Intravenous Q24H  . feeding supplement (ENSURE COMPLETE)  237 mL Oral BID BM  . fenofibrate  54 mg Oral Daily  . ketorolac  1 drop Both Eyes QID  . latanoprost  1 drop Both Eyes QHS  . lip balm  1 application Topical BID  . methylPREDNISolone  4 mg Oral QAC breakfast  . multivitamin with minerals  1 tablet Oral Daily  . polyvinyl alcohol  1 drop Both Eyes BID  . saccharomyces boulardii  250 mg Oral BID  . sodium chloride  10-40 mL Intracatheter Q12H   Continuous Infusions:   PRN Meds:.alum & mag hydroxide-simeth, bismuth subsalicylate, hydrocortisone, HYDROmorphone (DILAUDID) injection, magic mouthwash, ondansetron (ZOFRAN) IV, ondansetron, sodium chloride, witch hazel-glycerin  Antibiotics: Anti-infectives   Start     Dose/Rate Route Frequency Ordered Stop   08/13/13 2200  ertapenem (INVANZ) 1 g in sodium chloride 0.9 % 50 mL IVPB     1 g 100 mL/hr over 30 Minutes Intravenous Every 24 hours 08/13/13 1736     08/13/13 1430  piperacillin-tazobactam (ZOSYN) IVPB 3.375 g     3.375 g 100 mL/hr over 30 Minutes Intravenous  Once 08/13/13 1415 08/13/13 1611       PHYSICAL EXAM: Vital signs in last 24 hours: Filed Vitals:   08/15/13 2240 08/16/13 1429 08/16/13 2043 08/17/13 0500  BP: 123/63 134/90 144/77 126/71  Pulse: 67 93 68 60  Temp: 98.3 F (36.8 C) 97.9  F (36.6 C) 98.5 F (36.9 C) 98.2 F (36.8 C)  TempSrc: Oral Oral Oral Oral  Resp: 18 18 18 18   Height:      Weight:      SpO2: 98% 94% 94% 99%    Weight change:  Filed Weights   08/13/13 1305 08/13/13 1823  Weight: 48.535 kg (107 lb) 48.308 kg (106 lb 8 oz)   Body mass index is 17.72 kg/(m^2).   Gen Exam: Awake and alert with clear speech.     Neck: Supple, No JVD.   Chest: B/L Clear.  No accessory muscle movement CVS: S1 S2 Regular, no murmurs.  Abdomen: soft, BS +, tender in LLQ area, non distended.  Extremities: no edema, lower extremities warm to touch. Neurologic: Non Focal.   Skin: No Rash.     Intake/Output from previous day:  Intake/Output Summary (Last 24 hours) at 08/17/13 1431 Last data filed at 08/17/13 6962  Gross per 24 hour  Intake    567 ml  Output      0 ml  Net    567 ml     LAB RESULTS: CBC  Recent Labs Lab 08/13/13 1413 08/14/13 0806 08/15/13 0600 08/16/13 0505 08/17/13 0510  WBC 9.9 8.1 5.1 7.1 5.0  HGB 8.4* 7.5* 6.6* 9.4* 9.4*  HCT 25.9* 23.0* 21.2* 27.8* 28.1*  PLT 407* 420* 375 302 291  MCV 94.2 95.4 96.8 90.6 91.2  MCH 30.5 31.1 30.1 30.6 30.5  MCHC 32.4 32.6 31.1 33.8 33.5  RDW 18.7* 19.1* 19.0* 18.0* 18.9*  LYMPHSABS 1.5  --   --   --   --   MONOABS 1.1*  --   --   --   --   EOSABS 0.0  --   --   --   --   BASOSABS 0.0  --   --   --   --     Chemistries   Recent Labs Lab 08/13/13 1413 08/15/13 0600 08/16/13 0505 08/17/13 0510  NA 138 146 145 146  K 3.7 2.6* 3.8 3.9  CL 101 112 112 114*  CO2 22 22 24 23   GLUCOSE 92 83 84 77  BUN 9 4* 4* 6  CREATININE 0.69 0.58 0.58 0.57  CALCIUM 9.8 8.3* 8.5 8.8  MG  --   --  1.4*  --      Coagulation profile  Recent Labs Lab 08/13/13 1413 08/14/13 0806 08/15/13 0600  INR 2.20* 1.80* 1.42    Cardiac Enzymes  MICROBIOLOGY: Recent Results (from the past 240 hour(s))  CULTURE, ROUTINE-ABSCESS     Status: None   Collection Time    08/15/13 10:33 AM      Result Value Ref Range Status   Specimen Description ABSCESS   Final   Special Requests DIVERTICULAR ABSCESS POST PERC DRAINAGE   Final   Gram Stain PENDING   Incomplete   Culture     Final   Value: MULTIPLE ORGANISMS PRESENT, NONE PREDOMINANT     Performed at [facility]   Report Status PENDING   Incomplete    RADIOLOGY STUDIES/RESULTS: Ct Head Wo  Contrast  08/13/2013   CLINICAL DATA:  Previous traumatic injury, nausea  EXAM: CT HEAD WITHOUT CONTRAST  TECHNIQUE: Contiguous axial images were obtained from the base of the skull through the vertex without intravenous contrast.  COMPARISON:  None.  FINDINGS: The bony calvarium is intact. Soft tissue hematoma is noted in the left frontal region consistent with a recent history. Changes  consistent with prior left frontal craniotomy are noted. Septal malacia changes are noted secondary to the previous craniotomy also in the left frontal region. Mild atrophic changes are seen. No findings to suggest acute hemorrhage, acute infarction or space-occupying mass lesion are noted. Ex vacuo dilatation of the left lateral ventricle is noted.  IMPRESSION: Small left frontal soft tissue scalp hematoma.  Chronic changes as described.   Electronically Signed   By: Inez Catalina M.D.   On: 08/13/2013 14:57   Ct Abdomen Pelvis W Contrast  08/13/2013   CLINICAL DATA:  Abdominal pain with nausea and weight loss. History of renal cell carcinoma.  EXAM: CT ABDOMEN AND PELVIS WITH CONTRAST  TECHNIQUE: Multidetector CT imaging of the abdomen and pelvis was performed using the standard protocol following bolus administration of intravenous contrast.  CONTRAST:  75 ml Omnipaque 300.  COMPARISON:  Abdominal pelvic CT 05/13/2012.  FINDINGS: There is stable scarring at the right lung base, including a nodular component in the right lower lobe measuring 1.4 cm on image 17. There is no significant pleural or pericardial effusion.  The gallbladder is surgically absent. There is no significant biliary dilatation. The liver and spleen appear unremarkable. The pancreas is diffusely atrophied without focal abnormality.  The right kidney is surgically absent. There is no mass within the nephrectomy bed. The left kidney appears normal. Both adrenal glands appear normal.  The stomach, small bowel and proximal colon demonstrate no significant  findings. There is extensive sigmoid colon diverticulitis with wall thickening and surrounding inflammation. There are multiple extraluminal air fluid collections surrounding the sigmoid colon. These measure 3.5 x 2.7 cm along the left aspect of the proximal sigmoid colon (image 74) and 4.5 x 1.9 cm more distally along the left aspect of the sigmoid colon. Within the pelvic cul-de-sac, there is a 3.1 x 4.8 cm air-fluid collection on image 77. The contrast material has not yet passed through the sigmoid colon. The uterus and ovaries appear normal. The bladder is nearly empty, but demonstrates no pneumatosis. A degree of pelvic floor relaxation is suspected. There is no evidence of bowel obstruction or pneumoperitoneum.  Chronic L1 compression deformity is unchanged. There are no worrisome osseous findings. Subchondral sclerosis anteriorly in the right femoral head is stable, potentially reflecting chronic avascular necrosis.  IMPRESSION: 1. Acute sigmoid diverticulitis with multiple pericolonic abscesses. 2. No evidence of free intraperitoneal air or bowel obstruction. 3. No evidence of metastatic renal cell carcinoma. The right nephrectomy bed and left kidney have stable appearances. 4. These results will be called to the ordering clinician or representative by the Radiology Department at the imaging location. Patient has been detained on site for subsequent disposition.   Electronically Signed   By: Camie Patience M.D.   On: 08/13/2013 12:36   Ct Image Guided Drainage By Percutaneous Catheter  08/15/2013   INDICATION: Diverticular abscess. Please attempted CT-guided drainage of the dominant collection located within the lower pelvis.  EXAM: CT IMAGE GUIDED DRAINAGE BY PERCUTANEOUS CATHETER  COMPARISON:  CT the abdomen pelvis - 08/13/2013  MEDICATIONS: The patient is currently admitted to the hospital and receiving intravenous antibiotics. The antibiotics were administered within an appropriate time frame prior to  the initiation of the procedure.  ANESTHESIA/SEDATION: Fentanyl 75 mcg IV; Versed 2 mg IV  Total Moderate Sedation time  25 minutes  CONTRAST:  None  COMPLICATIONS: None immediate  PROCEDURE: Informed written consent was obtained from the patient after a discussion of the risks, benefits and alternatives to  treatment. The patient was placed prone on the CT gantry and a pre procedural CT was performed re-demonstrating the known abdominal abscess/fluid collection within the lower pelvis, which appeared minimally decreased in size in the interval, currently measuring approximately 2.9 x 1.9 cm (image 40, series 2), previously, 4.8 x 3.1 cm.  Of note, the additional smaller diverticular abscesses within the left hemipelvis also appear to have decreased in size in interval measuring approximately 1.8 x 3.2 cm, previously, 1.9 x 4.5 cm, and approximately 1.2 x 2.3 cm, previously 3.5 x 2.7 cm (both smaller abscesses seen on image 30, series 2).  The decision was made to perform CT guided drainage of the residual dominant collection within the lower pelvis. The procedure was planned. A timeout was performed prior to the initiation of the procedure.  The left buttocks was prepped and draped in the usual sterile fashion. The overlying soft tissues were anesthetized with 1% lidocaine with epinephrine. Appropriate trajectory was planned with the use of a 22 gauge spinal needle. An 18 gauge trocar needle was advanced into the abscess/fluid collection and a short Amplatz super stiff wire was coiled within the collection. Appropriate positioning was confirmed with a limited CT scan. The tract was serially dilated allowing placement of a 10 [place] all-purpose drainage catheter. Appropriate positioning was confirmed with a limited postprocedural CT scan.  Approximately 5 cc of purulent fluid was aspirated. The tube was connected to a drainage bag and sutured in place. A dressing was placed. The patient tolerated the procedure well  without immediate post procedural complication.  IMPRESSION: 1. Successful CT guided placement of a 10 [place] all purpose drain catheter into the dominant diverticular abscess within the lower pelvis via a left trans gluteal approach with aspiration of 5 mL of purulent fluid. Samples were sent to the laboratory as requested by the ordering clinical team. 2. The two additional smaller diverticular abscesses with the left hemipelvis have decreased in size and are too small to allow for percutaneous drainage.  PLAN: - Pending drain output, would recommend followup pelvic CT with oral and IV contrast in approximately 5-7 days.  - Recommend fluoroscopic drainage catheter injection prior to removal.   Electronically Signed   By: Sandi Mariscal M.D.   On: 08/15/2013 11:07    Karen Kitchens [facility] Pager:[phone]  If 7PM-7AM, please contact night-coverage [email or web address] Password [email or web address] 08/17/2013, 2:31 PM   LOS: 4 days   **Disclaimer: This note may have been dictated with voice recognition software. Similar sounding words can inadvertently be transcribed and this note may contain transcription errors which may not have been corrected upon publication of note.**

## 2013-08-17 NOTE — Progress Notes (Signed)
I have seen and examined the patient and agree with the assessment and plans. May be able to be discharged as early as tomorrow with her drain  Alexcis Bicking A. Ninfa Linden  MD, FACS

## 2013-08-17 NOTE — Progress Notes (Signed)
NUTRITION FOLLOW UP  Intervention:   - Ensure Complete BID - Will order snacks in between meals - Discussed refeeding risk with MD, got verbal order to check phosphorus, recommend MD replace if low  - RD to continue to monitor   Nutrition Dx:   Inadequate oral intake related to poor appetite as evidenced by weight loss and dietary recall - ongoing    Goal:   Intake to meet >90% of estimated nutrition needs - not met   Monitor:   Weights, labs, intake, diarrhea   Assessment:   PMHx significant for GERD, asthma, HA, renal cell carcinoma. Outpatient CT showed diverticulitis and multiple abscesses. Admitted with abdominal pain. Work-up reveals sigmoid diverticulitis.   5/29: Currently NPO, allowed ice chips. Pt confirms that her usual body weight is 135 - 140 lb and last weighed this 2 months ago. Pt is now down to 106 lb, a weight change of 23% x 2 months - severe for this time frame. Pt states that her intake has dramatically decreased over the past month. On average, only eats 1 meal a day now, which is breakfast. She will fix herself eggs, sausage/bacon and grits and only eat half of a portion. Has tried to drink Ensure in the past 2 weeks. Severe clavicle wasting present. Pt meets criteria for severe MALNUTRITION in the context of chronic illness as evidenced by 23% wt loss x 2 months, intake of <75% x at least 1 month, and severe muscle mass loss.  6/1: Pt discussed during multidisciplinary rounds. Had pelvis drain placed 5/30. Diet advanced to bland/soft yesterday, PO intake documented as 50% of meals. Met with pt who reports her appetite is "terrible" and that she has been forcing herself to eat. Agreeable to getting snacks and Ensure Complete. States the diarrhea is about the same.   Potassium WNL Phosphorus not ordered Magnesium low, getting replaced  Also getting daily multivitamin    Potassium  Date/Time Value Ref Range Status  08/17/2013  5:10 AM 3.9  3.7 - 5.3 mEq/L Final   08/16/2013  5:05 AM 3.8  3.7 - 5.3 mEq/L Final     DELTA CHECK NOTED  08/15/2013  6:00 AM 2.6* 3.7 - 5.3 mEq/L Final     CRITICAL RESULT CALLED TO, READ BACK BY AND VERIFIED WITH:     ESPERANCERRN [id number] MCCAULEG    No results found for this basename: phos    Magnesium  Date/Time Value Ref Range Status  08/16/2013  5:05 AM 1.4* 1.5 - 2.5 mg/dL Final      Height: Ht Readings from Last 1 Encounters:  08/13/13 5' 5"  (1.651 m)    Weight Status:   Wt Readings from Last 1 Encounters:  08/13/13 106 lb 8 oz (48.308 kg)    Re-estimated needs:  Kcal: 1300 - 1500  Protein: at least 70 g daily  Fluid: approx 1.5 liters daily   Skin: Facial edema in left eye due to fall at home  Diet Order: Criss Rosales   Intake/Output Summary (Last 24 hours) at 08/17/13 1047 Last data filed at 08/17/13 3559  Gross per 24 hour  Intake    842 ml  Output      0 ml  Net    842 ml    Last BM: 5/31   Labs:   Recent Labs Lab 08/15/13 0600 08/16/13 0505 08/17/13 0510  NA 146 145 146  K 2.6* 3.8 3.9  CL 112 112 114*  CO2 22 24 23   BUN 4* 4*  6  CREATININE 0.58 0.58 0.57  CALCIUM 8.3* 8.5 8.8  MG  --  1.4*  --   GLUCOSE 83 84 77    CBG (last 3)  No results found for this basename: GLUCAP,  in the last 72 hours  Scheduled Meds: . acetaminophen  1,000 mg Oral TID  . cycloSPORINE  1 drop Both Eyes BID  . ertapenem  1 g Intravenous Q24H  . fenofibrate  54 mg Oral Daily  . ketorolac  1 drop Both Eyes QID  . latanoprost  1 drop Both Eyes QHS  . lip balm  1 application Topical BID  . methylPREDNISolone  4 mg Oral QAC breakfast  . multivitamin with minerals  1 tablet Oral Daily  . polyvinyl alcohol  1 drop Both Eyes BID  . saccharomyces boulardii  250 mg Oral BID  . sodium chloride  10-40 mL Intracatheter Q12H    Mikey College MS, RD, LDN [phone] Pager [phone] Weekend/After Hours Pager

## 2013-08-17 NOTE — Progress Notes (Signed)
Physical Therapy Treatment Patient Details Name: Ashley [name] MRN: [id number] DOB: [birth year] Today's Date: 08/17/2013    History of Present Illness 80 y.o. female Sent to ED after getting an outpatient CT which showed diverticulitis and multiple abscesses.    PT Comments     pt ambulated again in hall. Recommend HHPT  Follow Up Recommendations  Home health PT     Equipment Recommendations  Rolling walker with 5" wheels    Recommendations for Other Services       Precautions / Restrictions Precautions Precautions: Fall Precaution Comments: poor vision Restrictions Weight Bearing Restrictions: No    Mobility  Bed Mobility Overal bed mobility: Modified Independent                Transfers Overall transfer level: Needs assistance Equipment used: Rolling walker (2 wheeled) Transfers: Sit to/from Stand Sit to Stand: Min guard;Min assist         General transfer comment: min guard from be, min from low toilet.  Ambulation/Gait Ambulation/Gait assistance: Min assist Ambulation Distance (Feet): 90 Feet Assistive device: Rolling walker (2 wheeled)       General Gait Details: short distance in room holding onto Iv pole., cues for safety, turns.   Stairs            Wheelchair Mobility    Modified Rankin (Stroke Patients Only)       Balance     Sitting balance-Leahy Scale: Normal     Standing balance support: No upper extremity supported Standing balance-Leahy Scale: Fair                      Cognition Arousal/Alertness: Awake/alert Behavior During Therapy: WFL for tasks assessed/performed Overall Cognitive Status: Within Functional Limits for tasks assessed                      Exercises      General Comments        Pertinent Vitals/Pain No c/o except tired.    Home Living Family/patient expects to be discharged to:: Private residence Living Arrangements: Spouse/significant other Available Help at Discharge:  Available 24 hours/day Type of Home: House Home Access: Stairs to enter Entrance Stairs-Rails: Right;Left;Can reach both Home Layout: One level Home Equipment: [place] Rim - single point;Shower seat;Grab bars - toilet;Grab bars - tub/shower (has walker)      Prior Function Level of Independence: Needs assistance  Gait / Transfers Assistance Needed: assist for stairs       PT Goals (current goals can now be found in the care plan section) Acute Rehab PT Goals Patient Stated Goal: not stated Progress towards PT goals: Progressing toward goals    Frequency  Min 3X/week    PT Plan Current plan remains appropriate    Co-evaluation             End of Session   Activity Tolerance: Patient tolerated treatment well Patient left: in bed;with call bell/phone within reach;with bed alarm set;with family/visitor present     Time: 7893-8101 PT Time Calculation (min): 26 min  Charges:  $Gait Training: 23-37 mins                    G Codes:      Claretha Cooper 08/17/2013, 4:31 PM

## 2013-08-18 ENCOUNTER — Inpatient Hospital Stay (HOSPITAL_COMMUNITY): Payer: Medicare Other

## 2013-08-18 DIAGNOSIS — K5732 Diverticulitis of large intestine without perforation or abscess without bleeding: Secondary | ICD-10-CM | POA: Diagnosis not present

## 2013-08-18 DIAGNOSIS — R109 Unspecified abdominal pain: Secondary | ICD-10-CM | POA: Diagnosis not present

## 2013-08-18 DIAGNOSIS — K630 Abscess of intestine: Secondary | ICD-10-CM | POA: Diagnosis not present

## 2013-08-18 LAB — CBC
HCT: 29.5 % — ABNORMAL LOW (ref 36.0–46.0)
HEMOGLOBIN: 9.7 g/dL — AB (ref 12.0–15.0)
MCH: 30 pg (ref 26.0–34.0)
MCHC: 32.9 g/dL (ref 30.0–36.0)
MCV: 91.3 fL (ref 78.0–100.0)
Platelets: 303 10*3/uL (ref 150–400)
RBC: 3.23 MIL/uL — ABNORMAL LOW (ref 3.87–5.11)
RDW: 18.6 % — ABNORMAL HIGH (ref 11.5–15.5)
WBC: 5 10*3/uL (ref 4.0–10.5)

## 2013-08-18 LAB — CULTURE, ROUTINE-ABSCESS

## 2013-08-18 LAB — BASIC METABOLIC PANEL
BUN: 7 mg/dL (ref 6–23)
CO2: 25 meq/L (ref 19–32)
Calcium: 9 mg/dL (ref 8.4–10.5)
Chloride: 110 mEq/L (ref 96–112)
Creatinine, Ser: 0.61 mg/dL (ref 0.50–1.10)
GFR calc Af Amer: >90 mL/min (ref >90)
GFR, EST NON AFRICAN AMERICAN: 82 mL/min — AB (ref >90)
GLUCOSE: 81 mg/dL (ref 70–99)
POTASSIUM: 4.1 meq/L (ref 3.7–5.3)
Sodium: 144 mEq/L (ref 137–147)

## 2013-08-18 LAB — CLOSTRIDIUM DIFFICILE BY PCR: Toxigenic C. Difficile by PCR: NEGATIVE

## 2013-08-18 MED ORDER — POLYETHYLENE GLYCOL 3350 17 G PO PACK
17.0000 g | PACK | Freq: Every day | ORAL | Status: DC
  Filled 2013-08-18 (×2): qty 1

## 2013-08-18 MED ORDER — K PHOS MONO-SOD PHOS DI & MONO 155-852-130 MG PO TABS
500.0000 mg | ORAL_TABLET | Freq: Once | ORAL | Status: AC
  Administered 2013-08-18: 500 mg via ORAL
  Filled 2013-08-18: qty 2

## 2013-08-18 MED ORDER — CIPROFLOXACIN HCL 500 MG PO TABS
500.0000 mg | ORAL_TABLET | Freq: Two times a day (BID) | ORAL | Status: DC
  Administered 2013-08-18 – 2013-08-19 (×2): 500 mg via ORAL
  Filled 2013-08-18 (×4): qty 1

## 2013-08-18 MED ORDER — IOHEXOL 300 MG/ML  SOLN
80.0000 mL | Freq: Once | INTRAMUSCULAR | Status: AC | PRN
  Administered 2013-08-18: 80 mL via INTRAVENOUS

## 2013-08-18 MED ORDER — IOHEXOL 300 MG/ML  SOLN
25.0000 mL | INTRAMUSCULAR | Status: AC
  Administered 2013-08-18 (×2): 25 mL via ORAL

## 2013-08-18 MED ORDER — APIXABAN 2.5 MG PO TABS
2.5000 mg | ORAL_TABLET | Freq: Two times a day (BID) | ORAL | Status: DC
  Administered 2013-08-18 – 2013-08-19 (×3): 2.5 mg via ORAL
  Filled 2013-08-18 (×4): qty 1

## 2013-08-18 NOTE — Progress Notes (Signed)
Occupational Therapy Treatment Patient Details Name: ORTENCIA ASKARI MRN: [id number] DOB: October 07, 1928 Today's Date: 08/18/2013    History of present illness 80 y.o. female Sent to ED after getting an outpatient CT which showed diverticulitis and multiple abscesses.   OT comments  Pt making progress with functional goals, however reports fatigue this morning and decline sitting up in recliner at end of OT session  Follow Up Recommendations  No OT follow up;Supervision/Assistance - 24 hour    Equipment Recommendations  None recommended by OT    Recommendations for Other Services      Precautions / Restrictions Precautions Precautions: Fall Precaution Comments: poor vision Restrictions Weight Bearing Restrictions: No       Mobility Bed Mobility Overal bed mobility: Modified Independent                Transfers Overall transfer level: Needs assistance Equipment used: Rolling walker (2 wheeled) Transfers: Sit to/from Stand Sit to Stand: Min guard              Balance   Sitting-balance support: No upper extremity supported;Feet supported Sitting balance-Leahy Scale: Good     Standing balance support: Single extremity supported;During functional activity Standing balance-Leahy Scale: Fair                     ADL       Grooming: Wash/dry hands;Standing;Wash/dry face;Min guard               Lower Body Dressing: Min guard;Sit to/from stand   Toilet Transfer: Ambulation;RW;Min guard;BSC   Toileting- Water quality scientist and Hygiene: Min guard;Sit to/from stand       Functional mobility during ADLs: Rolling walker;Cane;Min guard        Vision  hx of poor vision                              Cognition   Behavior During Therapy: WFL for tasks assessed/performed Overall Cognitive Status: Within Functional Limits for tasks assessed                                                  General Comments  Pt  pleasant and cooperative    Pertinent Vitals/ Pain       No c/o pain, VSS                                                          Frequency Min 2X/week     Progress Toward Goals  OT Goals(current goals can now be found in the care plan section)  Progress towards OT goals: Progressing toward goals     Plan Discharge plan remains appropriate                     End of Session Equipment Utilized During Treatment: Gait belt;Rolling walker;Other (comment) (BSC, cane)   Activity Tolerance Patient limited by fatigue   Patient Left in bed;with call bell/phone within reach;with bed alarm set             Time: 5956-3875 OT Time Calculation (min): 12 min  Charges: OT  General Charges $OT Visit: 1 Procedure OT Treatments $Self Care/Home Management : 8-22 mins  Mosetta Putt 08/18/2013, 1:30 PM

## 2013-08-18 NOTE — Progress Notes (Signed)
PATIENT DETAILS Name: Ashley [name] Age: 80 y.o. Sex: female Date of Birth: [birth year] Admit Date: 08/13/2013 Admitting Physician No admitting provider for patient encounter. FYB:[id number],HENID L, MD  Subjective: Feeling better, eating breakfast.  Wants to go home.  Assessment/Plan: Principal Problem:    Diverticulitis of large intestine with abscess without bleeding -Admitted and started on Invanz since 5/28, Gen surgery and IR consutled.  -Underwent CT guided drainage catheter placement and drainage of the abscess on 5/30. Currently on soft diet and tolerating it well.   -Repeat CT scan shows near resolution of the abscess being drained and improvement in the two smaller abscesses. -Remains afebrile and with no Leukocytosis.  -CCS recommends 1 more day in the hospital given mild constipation and pain.  Anemia -suspect anemia of acute illness, and slow GI bleed.  Guiac negative. -transfuse 2 units of PRBC on 5/30-recheck in am -hgb stable at 9.7 on 6/2.    Atrial fibrillation -stable -coumadin reversed with Vit K -Discussed with CCS will resume anticoagulation 6/2.  Will start Eliquis.  Given age and co morbidities will use 2.5 mg dose.   Hypokalemia -normalized, recheck periodically  Hypomagnesemia -replete and recheck  GERD  protonix.   Rheumatoid Arthritis  Complains of pain in her hands and legs.  Holding DMARDs (Sulfasalazine/Leflunomide) for now-given ongoing infection/abscess. Will resume Methylprednisone as she is on this long term  Dyslipidemia -resume Fibrates  HTN -BP controlled without the need for anti-hypertensive. HCTZ remains on hold  H/O RCC  S/p nephrectomy  Creatinine is normal.  Protein-calorie malnutrition, severe -in the context of chronic illness -supplements with diet advanced further  Mild hypophosphatemia -will replete with oral Kphos 500 mg x 1  Underweight  Disposition: Remain inpatient  DVT  Prophylaxis: SCD's  Code Status: Full code   Family Communication Husband at bedside on 6/2.  Procedures:  5/30-Underwent CT guided drainage catheter placement and drainage of the abscess.  CONSULTS:  general surgery  MEDICATIONS: Scheduled Meds: . acetaminophen  1,000 mg Oral TID  . apixaban  2.5 mg Oral BID  . cycloSPORINE  1 drop Both Eyes BID  . ertapenem  1 g Intravenous Q24H  . feeding supplement (ENSURE COMPLETE)  237 mL Oral BID BM  . fenofibrate  54 mg Oral Daily  . ketorolac  1 drop Both Eyes QID  . latanoprost  1 drop Both Eyes QHS  . lip balm  1 application Topical BID  . methylPREDNISolone  4 mg Oral QAC breakfast  . multivitamin with minerals  1 tablet Oral Daily  . phosphorus  500 mg Oral Once  . polyethylene glycol  17 g Oral Daily  . polyvinyl alcohol  1 drop Both Eyes BID  . saccharomyces boulardii  250 mg Oral BID  . sodium chloride  10-40 mL Intracatheter Q12H   Continuous Infusions:   PRN Meds:.alum & mag hydroxide-simeth, hydrocortisone, HYDROmorphone (DILAUDID) injection, magic mouthwash, ondansetron (ZOFRAN) IV, ondansetron, sodium chloride, witch hazel-glycerin  Antibiotics: Anti-infectives   Start     Dose/Rate Route Frequency Ordered Stop   08/13/13 2200  ertapenem (INVANZ) 1 g in sodium chloride 0.9 % 50 mL IVPB     1 g 100 mL/hr over 30 Minutes Intravenous Every 24 hours 08/13/13 1736     08/13/13 1430  piperacillin-tazobactam (ZOSYN) IVPB 3.375 g     3.375 g 100 mL/hr over 30 Minutes Intravenous  Once 08/13/13 1415 08/13/13 1611       PHYSICAL  EXAM: Vital signs in last 24 hours: Filed Vitals:   08/17/13 0500 08/17/13 1511 08/17/13 2120 08/18/13 0548  BP: 126/71 120/73 125/61 133/70  Pulse: 60 72 69 64  Temp: 98.2 F (36.8 C) 97.4 F (36.3 C) 97.7 F (36.5 C) 97.8 F (36.6 C)  TempSrc: Oral Oral Oral Oral  Resp: 18 18 18 20   Height:      Weight:      SpO2: 99% 96% 98% 97%    Weight change:  Filed Weights   08/13/13  1305 08/13/13 1823  Weight: 48.535 kg (107 lb) 48.308 kg (106 lb 8 oz)   Body mass index is 17.72 kg/(m^2).   Gen Exam: Awake and alert with clear speech.  Small hematoma over left eye. Neck: Supple, No JVD.   Chest: B/L Clear.  No accessory muscle movement CVS: S1 S2 Regular, no murmurs.  Abdomen: soft, BS +, tender in LLQ area, non distended.  Extremities: no edema, lower extremities warm to touch.    Intake/Output from previous day:  Intake/Output Summary (Last 24 hours) at 08/18/13 1249 Last data filed at 08/18/13 1000  Gross per 24 hour  Intake    233 ml  Output    320 ml  Net    -87 ml     LAB RESULTS: CBC  Recent Labs Lab 08/13/13 1413 08/14/13 0806 08/15/13 0600 08/16/13 0505 08/17/13 0510 08/18/13 0510  WBC 9.9 8.1 5.1 7.1 5.0 5.0  HGB 8.4* 7.5* 6.6* 9.4* 9.4* 9.7*  HCT 25.9* 23.0* 21.2* 27.8* 28.1* 29.5*  PLT 407* 420* 375 302 291 303  MCV 94.2 95.4 96.8 90.6 91.2 91.3  MCH 30.5 31.1 30.1 30.6 30.5 30.0  MCHC 32.4 32.6 31.1 33.8 33.5 32.9  RDW 18.7* 19.1* 19.0* 18.0* 18.9* 18.6*  LYMPHSABS 1.5  --   --   --   --   --   MONOABS 1.1*  --   --   --   --   --   EOSABS 0.0  --   --   --   --   --   BASOSABS 0.0  --   --   --   --   --     Chemistries   Recent Labs Lab 08/13/13 1413 08/15/13 0600 08/16/13 0505 08/17/13 0510 08/18/13 0510  NA 138 146 145 146 144  K 3.7 2.6* 3.8 3.9 4.1  CL 101 112 112 114* 110  CO2 22 22 24 23 25   GLUCOSE 92 83 84 77 81  BUN 9 4* 4* 6 7  CREATININE 0.69 0.58 0.58 0.57 0.61  CALCIUM 9.8 8.3* 8.5 8.8 9.0  MG  --   --  1.4*  --   --      Coagulation profile  Recent Labs Lab 08/13/13 1413 08/14/13 0806 08/15/13 0600  INR 2.20* 1.80* 1.42    Cardiac Enzymes  MICROBIOLOGY: Recent Results (from the past 240 hour(s))  CULTURE, ROUTINE-ABSCESS     Status: None   Collection Time    08/15/13 10:33 AM      Result Value Ref Range Status   Specimen Description ABSCESS   Final   Special Requests  DIVERTICULAR ABSCESS POST PERC DRAINAGE   Final   Gram Stain     Final   Value: FEW WBC PRESENT,BOTH PMN AND MONONUCLEAR     NO SQUAMOUS EPITHELIAL CELLS SEEN     NO ORGANISMS SEEN     Performed at [facility]  Final   Value: MULTIPLE ORGANISMS PRESENT, NONE PREDOMINANT     Note: NO STAPHYLOCOCCUS AUREUS ISOLATED NO GROUP A STREP (S.PYOGENES) ISOLATED     Performed at [facility]   Report Status 08/18/2013 FINAL   Final    RADIOLOGY STUDIES/RESULTS: Ct Head Wo Contrast  08/13/2013   CLINICAL DATA:  Previous traumatic injury, nausea  EXAM: CT HEAD WITHOUT CONTRAST  TECHNIQUE: Contiguous axial images were obtained from the base of the skull through the vertex without intravenous contrast.  COMPARISON:  None.  FINDINGS: The bony calvarium is intact. Soft tissue hematoma is noted in the left frontal region consistent with a recent history. Changes consistent with prior left frontal craniotomy are noted. Septal malacia changes are noted secondary to the previous craniotomy also in the left frontal region. Mild atrophic changes are seen. No findings to suggest acute hemorrhage, acute infarction or space-occupying mass lesion are noted. Ex vacuo dilatation of the left lateral ventricle is noted.  IMPRESSION: Small left frontal soft tissue scalp hematoma.  Chronic changes as described.   Electronically Signed   By: Inez Catalina M.D.   On: 08/13/2013 14:57   Ct Abdomen Pelvis W Contrast  08/13/2013   CLINICAL DATA:  Abdominal pain with nausea and weight loss. History of renal cell carcinoma.  EXAM: CT ABDOMEN AND PELVIS WITH CONTRAST  TECHNIQUE: Multidetector CT imaging of the abdomen and pelvis was performed using the standard protocol following bolus administration of intravenous contrast.  CONTRAST:  75 ml Omnipaque 300.  COMPARISON:  Abdominal pelvic CT 05/13/2012.  FINDINGS: There is stable scarring at the right lung base, including a nodular component in the right lower  lobe measuring 1.4 cm on image 17. There is no significant pleural or pericardial effusion.  The gallbladder is surgically absent. There is no significant biliary dilatation. The liver and spleen appear unremarkable. The pancreas is diffusely atrophied without focal abnormality.  The right kidney is surgically absent. There is no mass within the nephrectomy bed. The left kidney appears normal. Both adrenal glands appear normal.  The stomach, small bowel and proximal colon demonstrate no significant findings. There is extensive sigmoid colon diverticulitis with wall thickening and surrounding inflammation. There are multiple extraluminal air fluid collections surrounding the sigmoid colon. These measure 3.5 x 2.7 cm along the left aspect of the proximal sigmoid colon (image 74) and 4.5 x 1.9 cm more distally along the left aspect of the sigmoid colon. Within the pelvic cul-de-sac, there is a 3.1 x 4.8 cm air-fluid collection on image 77. The contrast material has not yet passed through the sigmoid colon. The uterus and ovaries appear normal. The bladder is nearly empty, but demonstrates no pneumatosis. A degree of pelvic floor relaxation is suspected. There is no evidence of bowel obstruction or pneumoperitoneum.  Chronic L1 compression deformity is unchanged. There are no worrisome osseous findings. Subchondral sclerosis anteriorly in the right femoral head is stable, potentially reflecting chronic avascular necrosis.  IMPRESSION: 1. Acute sigmoid diverticulitis with multiple pericolonic abscesses. 2. No evidence of free intraperitoneal air or bowel obstruction. 3. No evidence of metastatic renal cell carcinoma. The right nephrectomy bed and left kidney have stable appearances. 4. These results will be called to the ordering clinician or representative by the Radiology Department at the imaging location. Patient has been detained on site for subsequent disposition.   Electronically Signed   By: Camie Patience M.D.    On: 08/13/2013 12:36   Ct Image Guided Drainage By Percutaneous Catheter  08/15/2013   INDICATION: Diverticular abscess. Please attempted CT-guided drainage of the dominant collection located within the lower pelvis.  EXAM: CT IMAGE GUIDED DRAINAGE BY PERCUTANEOUS CATHETER  COMPARISON:  CT the abdomen pelvis - 08/13/2013  MEDICATIONS: The patient is currently admitted to the hospital and receiving intravenous antibiotics. The antibiotics were administered within an appropriate time frame prior to the initiation of the procedure.  ANESTHESIA/SEDATION: Fentanyl 75 mcg IV; Versed 2 mg IV  Total Moderate Sedation time  25 minutes  CONTRAST:  None  COMPLICATIONS: None immediate  PROCEDURE: Informed written consent was obtained from the patient after a discussion of the risks, benefits and alternatives to treatment. The patient was placed prone on the CT gantry and a pre procedural CT was performed re-demonstrating the known abdominal abscess/fluid collection within the lower pelvis, which appeared minimally decreased in size in the interval, currently measuring approximately 2.9 x 1.9 cm (image 40, series 2), previously, 4.8 x 3.1 cm.  Of note, the additional smaller diverticular abscesses within the left hemipelvis also appear to have decreased in size in interval measuring approximately 1.8 x 3.2 cm, previously, 1.9 x 4.5 cm, and approximately 1.2 x 2.3 cm, previously 3.5 x 2.7 cm (both smaller abscesses seen on image 30, series 2).  The decision was made to perform CT guided drainage of the residual dominant collection within the lower pelvis. The procedure was planned. A timeout was performed prior to the initiation of the procedure.  The left buttocks was prepped and draped in the usual sterile fashion. The overlying soft tissues were anesthetized with 1% lidocaine with epinephrine. Appropriate trajectory was planned with the use of a 22 gauge spinal needle. An 18 gauge trocar needle was advanced into the  abscess/fluid collection and a short Amplatz super stiff wire was coiled within the collection. Appropriate positioning was confirmed with a limited CT scan. The tract was serially dilated allowing placement of a 10 [place] all-purpose drainage catheter. Appropriate positioning was confirmed with a limited postprocedural CT scan.  Approximately 5 cc of purulent fluid was aspirated. The tube was connected to a drainage bag and sutured in place. A dressing was placed. The patient tolerated the procedure well without immediate post procedural complication.  IMPRESSION: 1. Successful CT guided placement of a 10 [place] all purpose drain catheter into the dominant diverticular abscess within the lower pelvis via a left trans gluteal approach with aspiration of 5 mL of purulent fluid. Samples were sent to the laboratory as requested by the ordering clinical team. 2. The two additional smaller diverticular abscesses with the left hemipelvis have decreased in size and are too small to allow for percutaneous drainage.  PLAN: - Pending drain output, would recommend followup pelvic CT with oral and IV contrast in approximately 5-7 days.  - Recommend fluoroscopic drainage catheter injection prior to removal.   Electronically Signed   By: Sandi Mariscal M.D.   On: 08/15/2013 11:07    Karen Kitchens [facility] Pager:[phone]  If 7PM-7AM, please contact night-coverage [email or web address] Password [email or web address] 08/18/2013, 12:49 PM   LOS: 5 days   **Disclaimer: This note may have been dictated with voice recognition software. Similar sounding words can inadvertently be transcribed and this note may contain transcription errors which may not have been corrected upon publication of note.**

## 2013-08-18 NOTE — Progress Notes (Signed)
   I agree with the History/assesment & plan per Midlevel provider as per above Further details as follows:-               Doing well.  Pain better than this am, tolerating diet  Ct abdomen 6/2 shows almost complete resolution of the abscesses-close to d/c home  Transition Ertapenem to PO cipro 500 bid today 6/2-stop date 08/28/13  Anemia of chronic disease, has also Rheumatoid which can cause this.  Leflunomide has hemotoxic effects and increases risk for infection explaining her current infections-she should not continue on this unless vetted by Rheumatologist-additionally her methylprednisone needs to be cut back as OP to lowest possible dose given risk of Se's as well  Renal cell Ca s/p nephrectomy warrant OP follow up by PCP  Drain management/removal tbd prior to d/c home by IR/Surgery   Verneita Griffes, MD [facility] [phone]

## 2013-08-18 NOTE — Progress Notes (Signed)
  Subjective: She is still having some lower abdominal pain and reports that she is feeling slightly constipated  Objective: Vital signs in last 24 hours: Temp:  [97.4 F (36.3 C)-97.8 F (36.6 C)] 97.8 F (36.6 C) (06/02 0548) Pulse Rate:  [64-72] 64 (06/02 0548) Resp:  [18-20] 20 (06/02 0548) BP: (120-133)/(61-73) 133/70 mmHg (06/02 0548) SpO2:  [96 %-98 %] 97 % (06/02 0548) Last BM Date: 08/16/13  Intake/Output from previous day: 06/01 0701 - 06/02 0700 In: 11 [P.O.:1] Out: 320 [Urine:300; Drains:20] Intake/Output this shift:    Abdomen soft with some mild LLQ tenderness.  Drain serosang  Lab Results:   Recent Labs  08/17/13 0510 08/18/13 0510  WBC 5.0 5.0  HGB 9.4* 9.7*  HCT 28.1* 29.5*  PLT 291 303   BMET  Recent Labs  08/17/13 0510 08/18/13 0510  NA 146 144  K 3.9 4.1  CL 114* 110  CO2 23 25  GLUCOSE 77 81  BUN 6 7  CREATININE 0.57 0.61  CALCIUM 8.8 9.0   PT/INR No results found for this basename: LABPROT, INR,  in the last 72 hours ABG No results found for this basename: PHART, PCO2, PO2, HCO3,  in the last 72 hours  Studies/Results: No results found.  Anti-infectives: Anti-infectives   Start     Dose/Rate Route Frequency Ordered Stop   08/13/13 2200  ertapenem (INVANZ) 1 g in sodium chloride 0.9 % 50 mL IVPB     1 g 100 mL/hr over 30 Minutes Intravenous Every 24 hours 08/13/13 1736     08/13/13 1430  piperacillin-tazobactam (ZOSYN) IVPB 3.375 g     3.375 g 100 mL/hr over 30 Minutes Intravenous  Once 08/13/13 1415 08/13/13 1611      Assessment/Plan:  Diverticulitis with abscess  Continuing per drain Given pain and issues moving her bowels, I think she needs another 24 hrs at least in the hospital.  Will add stool softener  LOS: 5 days    Harl Bowie 08/18/2013

## 2013-08-18 NOTE — Progress Notes (Signed)
NUTRITION FOLLOW UP  Intervention:   - Continue Ensure Complete BID and snacks in between meals - Encouraged pt to consume at least 50% of meals and all of nutritional supplements - RD to continue to monitor   Nutrition Dx:   Inadequate oral intake related to poor appetite as evidenced by weight loss and dietary recall - ongoing    Goal:   Intake to meet >90% of estimated nutrition needs - not met   Monitor:   Weights, labs, intake, diarrhea   Assessment:   PMHx significant for GERD, asthma, HA, renal cell carcinoma. Outpatient CT showed diverticulitis and multiple abscesses. Admitted with abdominal pain. Work-up reveals sigmoid diverticulitis.   5/29: Currently NPO, allowed ice chips. Pt confirms that her usual body weight is 135 - 140 lb and last weighed this 2 months ago. Pt is now down to 106 lb, a weight change of 23% x 2 months - severe for this time frame. Pt states that her intake has dramatically decreased over the past month. On average, only eats 1 meal a day now, which is breakfast. She will fix herself eggs, sausage/bacon and grits and only eat half of a portion. Has tried to drink Ensure in the past 2 weeks. Severe clavicle wasting present. Pt meets criteria for severe MALNUTRITION in the context of chronic illness as evidenced by 23% wt loss x 2 months, intake of <75% x at least 1 month, and severe muscle mass loss.  6/1: Pt discussed during multidisciplinary rounds. Had pelvis drain placed 5/30. Diet advanced to bland/soft yesterday, PO intake documented as 50% of meals. Met with pt who reports her appetite is "terrible" and that she has been forcing herself to eat. Agreeable to getting snacks and Ensure Complete. States the diarrhea is about the same.   6/2: Pt discussed during multidisciplinary rounds. MD decided that phosphorus was not low enough to need to be replaced. Met with pt who reports she didn't eat much of her dinner but did drink all of 1 Ensure Complete  yesterday.   Potassium WNL Phosphorus not ordered Magnesium low, getting replaced  Also getting daily multivitamin    Potassium  Date/Time Value Ref Range Status  08/18/2013  5:10 AM 4.1  3.7 - 5.3 mEq/L Final  08/17/2013  5:10 AM 3.9  3.7 - 5.3 mEq/L Final  08/16/2013  5:05 AM 3.8  3.7 - 5.3 mEq/L Final     DELTA CHECK NOTED    Phosphorus  Date/Time Value Ref Range Status  08/17/2013  6:10 AM 2.2* 2.3 - 4.6 mg/dL Final    Magnesium  Date/Time Value Ref Range Status  08/16/2013  5:05 AM 1.4* 1.5 - 2.5 mg/dL Final      Height: Ht Readings from Last 1 Encounters:  08/13/13 $RemoveB'5\' 5"'[id number]$  (1.651 m)    Weight Status:   Wt Readings from Last 1 Encounters:  08/13/13 106 lb 8 oz (48.308 kg)    Re-estimated needs:  Kcal: 1300 - 1500  Protein: at least 70 g daily  Fluid: approx 1.5 liters daily   Skin: Facial edema in left eye due to fall at home  Diet Order: Criss Rosales   Intake/Output Summary (Last 24 hours) at 08/18/13 1044 Last data filed at 08/18/13 0640  Gross per 24 hour  Intake     11 ml  Output    320 ml  Net   -309 ml    Last BM: 5/31   Labs:   Recent Labs Lab 08/15/13 0600 08/16/13 0505  08/17/13 0510 08/17/13 0610 08/18/13 0510  NA 146 145 146  --  144  K 2.6* 3.8 3.9  --  4.1  CL 112 112 114*  --  110  CO2 22 24 23   --  25  BUN 4* 4* 6  --  7  CREATININE 0.58 0.58 0.57  --  0.61  CALCIUM 8.3* 8.5 8.8  --  9.0  MG  --  1.4*  --   --   --   PHOS  --   --   --  2.2*  --   GLUCOSE 83 84 77  --  81    CBG (last 3)  No results found for this basename: GLUCAP,  in the last 72 hours  Scheduled Meds: . acetaminophen  1,000 mg Oral TID  . cycloSPORINE  1 drop Both Eyes BID  . ertapenem  1 g Intravenous Q24H  . feeding supplement (ENSURE COMPLETE)  237 mL Oral BID BM  . fenofibrate  54 mg Oral Daily  . ketorolac  1 drop Both Eyes QID  . latanoprost  1 drop Both Eyes QHS  . lip balm  1 application Topical BID  . methylPREDNISolone  4 mg Oral QAC breakfast   . multivitamin with minerals  1 tablet Oral Daily  . polyethylene glycol  17 g Oral Daily  . polyvinyl alcohol  1 drop Both Eyes BID  . saccharomyces boulardii  250 mg Oral BID  . sodium chloride  10-40 mL Intracatheter Q12H    Carlis Stable MS, RD, LDN [phone] Pager [phone] Weekend/After Hours Pager

## 2013-08-19 DIAGNOSIS — I10 Essential (primary) hypertension: Secondary | ICD-10-CM

## 2013-08-19 DIAGNOSIS — D649 Anemia, unspecified: Secondary | ICD-10-CM | POA: Diagnosis not present

## 2013-08-19 DIAGNOSIS — I4891 Unspecified atrial fibrillation: Secondary | ICD-10-CM | POA: Diagnosis not present

## 2013-08-19 DIAGNOSIS — K5732 Diverticulitis of large intestine without perforation or abscess without bleeding: Secondary | ICD-10-CM | POA: Diagnosis not present

## 2013-08-19 LAB — COMPREHENSIVE METABOLIC PANEL
ALK PHOS: 57 U/L (ref 39–117)
ALT: 11 U/L (ref 0–35)
AST: 29 U/L (ref 0–37)
Albumin: 2 g/dL — ABNORMAL LOW (ref 3.5–5.2)
BUN: 8 mg/dL (ref 6–23)
CHLORIDE: 108 meq/L (ref 96–112)
CO2: 25 meq/L (ref 19–32)
Calcium: 9.2 mg/dL (ref 8.4–10.5)
Creatinine, Ser: 0.64 mg/dL (ref 0.50–1.10)
GFR calc non Af Amer: 81 mL/min — ABNORMAL LOW (ref >90)
Glucose, Bld: 76 mg/dL (ref 70–99)
Potassium: 3.7 mEq/L (ref 3.7–5.3)
SODIUM: 143 meq/L (ref 137–147)
Total Bilirubin: 0.5 mg/dL (ref 0.3–1.2)
Total Protein: 5.3 g/dL — ABNORMAL LOW (ref 6.0–8.3)

## 2013-08-19 LAB — CBC WITH DIFFERENTIAL/PLATELET
Basophils Absolute: 0 10*3/uL (ref 0.0–0.1)
Basophils Relative: 0 % (ref 0–1)
Eosinophils Absolute: 0.2 10*3/uL (ref 0.0–0.7)
Eosinophils Relative: 3 % (ref 0–5)
HCT: 30.3 % — ABNORMAL LOW (ref 36.0–46.0)
Hemoglobin: 9.9 g/dL — ABNORMAL LOW (ref 12.0–15.0)
LYMPHS PCT: 30 % (ref 12–46)
Lymphs Abs: 1.4 10*3/uL (ref 0.7–4.0)
MCH: 30.6 pg (ref 26.0–34.0)
MCHC: 32.7 g/dL (ref 30.0–36.0)
MCV: 93.5 fL (ref 78.0–100.0)
Monocytes Absolute: 0.6 10*3/uL (ref 0.1–1.0)
Monocytes Relative: 12 % (ref 3–12)
Neutro Abs: 2.6 10*3/uL (ref 1.7–7.7)
Neutrophils Relative %: 55 % (ref 43–77)
PLATELETS: 271 10*3/uL (ref 150–400)
RBC: 3.24 MIL/uL — AB (ref 3.87–5.11)
RDW: 18.7 % — ABNORMAL HIGH (ref 11.5–15.5)
WBC: 4.8 10*3/uL (ref 4.0–10.5)

## 2013-08-19 MED ORDER — POLYETHYLENE GLYCOL 3350 17 G PO PACK: 17.0000 g | PACK | Freq: Every day | ORAL | Status: DC

## 2013-08-19 MED ORDER — ENSURE COMPLETE PO LIQD: 237.0000 mL | Freq: Two times a day (BID) | ORAL | Status: DC

## 2013-08-19 MED ORDER — CIPROFLOXACIN HCL 500 MG PO TABS: 500.0000 mg | ORAL_TABLET | Freq: Two times a day (BID) | ORAL | Status: DC

## 2013-08-19 MED ORDER — APIXABAN 2.5 MG PO TABS: 2.5000 mg | ORAL_TABLET | Freq: Two times a day (BID) | ORAL | Status: DC

## 2013-08-19 NOTE — Discharge Instructions (Signed)
Diverticulitis °A diverticulum is a small pouch or sac on the colon. Diverticulosis is the presence of these diverticula on the colon. Diverticulitis is the irritation (inflammation) or infection of diverticula. °CAUSES  °The colon and its diverticula contain bacteria. If food particles block the tiny opening to a diverticulum, the bacteria inside can grow and cause an increase in pressure. This leads to infection and inflammation and is called diverticulitis. °SYMPTOMS  °· Abdominal pain and tenderness. Usually, the pain is located on the left side of your abdomen. However, it could be located elsewhere. °· Fever. °· Bloating. °· Feeling sick to your stomach (nausea). °· Throwing up (vomiting). °· Abnormal stools. °DIAGNOSIS  °Your caregiver will take a history and perform a physical exam. Since many things can cause abdominal pain, other tests may be necessary. Tests may include: °· Blood tests. °· Urine tests. °· X-ray of the abdomen. °· CT scan of the abdomen. °Sometimes, surgery is needed to determine if diverticulitis or other conditions are causing your symptoms. °TREATMENT  °Most of the time, you can be treated without surgery. Treatment includes: °· Resting the bowels by only having liquids for a few days. As you improve, you will need to eat a low-fiber diet. °· Intravenous (IV) fluids if you are losing body fluids (dehydrated). °· Antibiotic medicines that treat infections may be given. °· Pain and nausea medicine, if needed. °· Surgery if the inflamed diverticulum has burst. °HOME CARE INSTRUCTIONS  °· Try a clear liquid diet (broth, tea, or water for as long as directed by your caregiver). You may then gradually begin a low-fiber diet as tolerated.  °A low-fiber diet is a diet with less than 10 grams of fiber. Choose the foods below to reduce fiber in the diet: °· White breads, cereals, rice, and pasta. °· Cooked fruits and vegetables or soft fresh fruits and vegetables without the skin. °· Ground or  well-cooked tender beef, ham, veal, lamb, pork, or poultry. °· Eggs and seafood. °· After your diverticulitis symptoms have improved, your caregiver may put you on a high-fiber diet. A high-fiber diet includes 14 grams of fiber for every 1000 calories consumed. For a standard 2000 calorie diet, you would need 28 grams of fiber. Follow these diet guidelines to help you increase the fiber in your diet. It is important to slowly increase the amount fiber in your diet to avoid gas, constipation, and bloating. °· Choose whole-grain breads, cereals, pasta, and brown rice. °· Choose fresh fruits and vegetables with the skin on. Do not overcook vegetables because the more vegetables are cooked, the more fiber is lost. °· Choose more nuts, seeds, legumes, dried peas, beans, and lentils. °· Look for food products that have greater than 3 grams of fiber per serving on the Nutrition Facts label. °· Take all medicine as directed by your caregiver. °· If your caregiver has given you a follow-up appointment, it is very important that you go. Not going could result in lasting (chronic) or permanent injury, pain, and disability. If there is any problem keeping the appointment, call to reschedule. °SEEK MEDICAL CARE IF:  °· Your pain does not improve. °· You have a hard time advancing your diet beyond clear liquids. °· Your bowel movements do not return to normal. °SEEK IMMEDIATE MEDICAL CARE IF:  °· Your pain becomes worse. °· You have an oral temperature above 102° F (38.9° C), not controlled by medicine. °· You have repeated vomiting. °· You have bloody or black, tarry stools. °·   Symptoms that brought you to your caregiver become worse or are not getting better. MAKE SURE YOU:   Understand these instructions.  Will watch your condition.  Will get help right away if you are not doing well or get worse. Document Released: 12/13/2004 Document Revised: 05/28/2011 Document Reviewed: 04/10/2010 [facility] Patient Information  2014 [place].  Information on my medicine - ELIQUIS (apixaban)  This medication education was reviewed with me or my healthcare representative as part of my discharge preparation.   Why was Eliquis prescribed for you? Eliquis was prescribed for you to reduce the risk of a blood clot forming that can cause a stroke if you have a medical condition called atrial fibrillation (a type of irregular heartbeat).  What do You need to know about Eliquis ? Take your Eliquis TWICE DAILY - one tablet in the morning and one tablet in the evening with or without food. If you have difficulty swallowing the tablet whole please discuss with your pharmacist how to take the medication safely.  Take Eliquis exactly as prescribed by your doctor and DO NOT stop taking Eliquis without talking to the doctor who prescribed the medication.  Stopping may increase your risk of developing a stroke.  Refill your prescription before you run out.  After discharge, you should have regular check-up appointments with your healthcare provider that is prescribing your Eliquis.  In the future your dose may need to be changed if your kidney function or weight changes by a significant amount or as you get older.  What do you do if you miss a dose? If you miss a dose, take it as soon as you remember on the same day and resume taking twice daily.  Do not take more than one dose of ELIQUIS at the same time to make up a missed dose.  Important Safety Information A possible side effect of Eliquis is bleeding. You should call your healthcare provider right away if you experience any of the following:   Bleeding from an injury or your nose that does not stop.   Unusual colored urine (red or dark brown) or unusual colored stools (red or black).   Unusual bruising for unknown reasons.   A serious fall or if you hit your head (even if there is no bleeding).  Some medicines may interact with Eliquis and might increase your  risk of bleeding or clotting while on Eliquis. To help avoid this, consult your healthcare provider or pharmacist prior to using any new prescription or non-prescription medications, including herbals, vitamins, non-steroidal anti-inflammatory drugs (NSAIDs) and supplements.  This website has more information on Eliquis (apixaban): [email or web address].

## 2013-08-19 NOTE — Progress Notes (Signed)
Pt with discharge orders to home. All discharge teaching completed with patient and spouse. Ask me 3 and teach back method used. Pt and spouse verbalized understanding of all discharge teaching. PICC removed by IV team. Dressing teaching completed with pt's spouse. Pt stable upon discharge with no further questions at this time.

## 2013-08-19 NOTE — Discharge Summary (Signed)
Physician Discharge Summary  SONNA LIPSKY YIR:[id number] DOB: 15-Mar-1929 DOA: 08/13/2013  PCP: Leonides Sake, MD  Admit date: 08/13/2013 Discharge date: 08/19/2013  Time spent: 50 minutes  Recommendations for Outpatient Follow-up:  1. Follow up with Dr. Hulen Skains of [facility] surgery regarding drain and antibiotics 2. Followup with rheumatology regarding rheumatoid arthritis medications 3. Followup with Dr. Lisbeth Ply regarding anticoagulation, urinary tract infection, malnutrition  Discharge Diagnoses:  Principal Problem:   Diverticulitis of large intestine with abscess without bleeding Active Problems:   Atrial fibrillation   GERD (gastroesophageal reflux disease)   Rheumatoid arthritis(714.0)   Renal cell carcinoma   Diverticulitis   Anemia   UTI (urinary tract infection)   Protein-calorie malnutrition, severe   Discharge Condition: Stable  Diet recommendation: As tolerated  Filed Weights   08/13/13 1305 08/13/13 1823  Weight: 48.535 kg (107 lb) 48.308 kg (106 lb 8 oz)    History of present illness:  Ms. Lona is an 80 year old female with a past medical history of rheumatoid arthritis, renal cell carcinoma, atrial fibrillation, and COPD.  She was seen by her primary care physician for abdominal pain and weight loss. CT of her abdomen and pelvis showed diverticulitis with multiple abscesses and she was referred to the hospital. Ms. Grandpre reported a 40 pound weight loss over the past year. She is on chronic Coumadin for atrial fibrillation.  Hospital Course:  Diverticulitis of large intestine with abscess without bleeding  -Admitted and started on Invanz since 5/28, Gen surgery and IR consutled.  -Underwent CT guided drainage catheter placement and drainage of the abscess on 5/30.  -Currently on soft diet and tolerating it well.  -Repeat CT scan (6/2) shows near resolution of the abscess being drained and improvement in the two smaller abscesses.  -Remains afebrile and  with no Leukocytosis.  -Will discharge with drain in place. Followup with [facility] [place] [facility] next week for disposition of drain and antibiotics. -Will discharge on Cipro per [facility] [place] [facility] instructions.  Anemia  -suspect anemia of acute illness. Guiac negative.  -transfuse 2 units of PRBC on 5/30 -hgb stable at 9.7 on 6/2.   Atrial fibrillation  -stable. Rate controlled. -coumadin reversed with Vit K  -Discussed with [facility] will resume anticoagulation 6/2.  -Started Eliquis. Given age and co morbidities will use 2.5 mg dose.   Hypokalemia  -Repleted   Hypomagnesemia  -repleted  GERD  -Protonix.  Rheumatoid Arthritis  -Complains of pain in her hands and legs.  -Held DMARDs (Sulfasalazine/Leflunomide) for now-given ongoing infection/abscess.  -Recommend that she confer with her rheumatologist before restarting DMARDs. -Resumed Methylprednisone as an inpatient.   Dyslipidemia  -resume Fibrates   HTN  -Her blood pressure was controlled as an inpatient with no hypertensive medications. -As she is improved her blood pressure has risen. Antihypertensive medications will be resumed on discharge  H/O RCC  -S/p nephrectomy  -Creatinine is normal.   Protein-calorie malnutrition, severe  -in the context of chronic illness  -Prescribed supplements  Mild hypophosphatemia  -repleted with oral Kphos   Underweight   Procedures:  CT guided drainage of diverticular abscess, with drain placement  Consultations:  [place] surgery  interventional radiology  Discharge Exam: Filed Vitals:   08/19/13 1346  BP: 134/80  Pulse: 83  Temp: 98.6 F (37 C)  Resp: 18    Discharge Instructions Gen Exam: Awake and alert with clear speech. Small hematoma over left eye. Pleasant, sitting up in chair Neck: Supple, No JVD.  Chest: B/L Clear. No  accessory muscle movement  CVS: S1 S2 Regular, no murmurs.  Abdomen: soft, BS +, tender in LLQ area, non distended.  Well-healed scar in lower abdomen Extremities: no edema, lower extremities warm to touch. Able to ambulate in the hallway        Discharge Instructions   Diet - low sodium heart healthy    Complete by:  As directed      Increase activity slowly    Complete by:  As directed             Medication List    STOP taking these medications       leflunomide 20 MG tablet  Commonly known as:  ARAVA     sulfaSALAzine 500 MG tablet  Commonly known as:  AZULFIDINE     warfarin 5 MG tablet  Commonly known as:  COUMADIN      TAKE these medications       apixaban 2.5 MG Tabs tablet  Commonly known as:  ELIQUIS  Take 1 tablet (2.5 mg total) by mouth 2 (two) times daily.     apraclonidine 0.5 % ophthalmic solution  Commonly known as:  IOPIDINE  Place 1 drop into both eyes 2 (two) times daily.     bromfenac 0.09 % ophthalmic solution  Commonly known as:  XIBROM  Place 1 drop into both eyes 2 (two) times daily.     CALCIUM 600 PO  Take 600 mg by mouth 2 (two) times daily.     ciprofloxacin 500 MG tablet  Commonly known as:  CIPRO  Take 1 tablet (500 mg total) by mouth 2 (two) times daily.     cycloSPORINE 0.05 % ophthalmic emulsion  Commonly known as:  RESTASIS  Place 1 drop into both eyes 2 (two) times daily.     dorzolamide 2 % ophthalmic solution  Commonly known as:  TRUSOPT  Place 1 drop into both eyes 2 (two) times daily.     feeding supplement (ENSURE COMPLETE) Liqd  Take 237 mLs by mouth 2 (two) times daily between meals.     fenofibrate 145 MG tablet  Commonly known as:  TRICOR  Take 145 mg by mouth daily.     hydrochlorothiazide 12.5 MG capsule  Commonly known as:  MICROZIDE  Take 12.5 mg by mouth daily.     hydrocortisone 2.5 % rectal cream  Commonly known as:  ANUSOL-HC  Place rectally 2 (two) times daily as needed for hemorrhoids.     latanoprost 0.005 % ophthalmic solution  Commonly known as:  XALATAN  Place 1 drop into both eyes at bedtime.      methylPREDNISolone 4 MG tablet  Commonly known as:  MEDROL  Take 4 mg by mouth daily.     multivitamin capsule  Take 1 capsule by mouth daily.     omega-3 acid ethyl esters 1 G capsule  Commonly known as:  LOVAZA  Take 1 g by mouth daily.     polyethylene glycol packet  Commonly known as:  MIRALAX / GLYCOLAX  Take 17 g by mouth daily.     REFRESH TEARS 0.5 % Soln  Generic drug:  carboxymethylcellulose  Place 1 drop into both eyes 2 (two) times daily.     trazodone 300 MG tablet  Commonly known as:  DESYREL  Take 300 mg by mouth at bedtime.     Vitamin D-3 5000 UNITS Tabs  Take 1 tablet by mouth daily.       No Known Allergies Follow-up  Information   Follow up with WYATT, Kathryne Eriksson, MD. Schedule an appointment as soon as possible for a visit in 1 week. (Continue drain care as instructed.   Make an appointment after the anitbiotics are completed.  Call sooner if you have pain or fever.)    Specialty:  General Surgery   Contact information:   [address], [place] [address] [phone]       Follow up with Leonides Sake, MD On 09/02/2013. (Appointment with Dr .Rochel Brome is on 09/02/13 at 10:45 fax hosp stay info to [phone] or [phone])    Specialty:  Family Medicine   Contact information:   Dr. Daiva Eves [place] [address] [phone]       Follow up In 2 weeks. (Follow up with rheumatology regarding abscesses and Rheumatoid Arthritis medications.)        The results of significant diagnostics from this hospitalization (including imaging, microbiology, ancillary and laboratory) are listed below for reference.    Significant Diagnostic Studies: Ct Head Wo Contrast  08/13/2013   CLINICAL DATA:  Previous traumatic injury, nausea  EXAM: CT HEAD WITHOUT CONTRAST  TECHNIQUE: Contiguous axial images were obtained from the base of the skull through the vertex without intravenous contrast.   COMPARISON:  None.  FINDINGS: The bony calvarium is intact. Soft tissue hematoma is noted in the left frontal region consistent with a recent history. Changes consistent with prior left frontal craniotomy are noted. Septal malacia changes are noted secondary to the previous craniotomy also in the left frontal region. Mild atrophic changes are seen. No findings to suggest acute hemorrhage, acute infarction or space-occupying mass lesion are noted. Ex vacuo dilatation of the left lateral ventricle is noted.  IMPRESSION: Small left frontal soft tissue scalp hematoma.  Chronic changes as described.   Electronically Signed   By: Inez Catalina M.D.   On: 08/13/2013 14:57   Ct Abdomen Pelvis W Contrast  08/18/2013   CLINICAL DATA:  Lower abdominal pain, left lower quadrant tenderness.  EXAM: CT ABDOMEN AND PELVIS WITH CONTRAST  TECHNIQUE: Multidetector CT imaging of the abdomen and pelvis was performed using the standard protocol following bolus administration of intravenous contrast.  CONTRAST:  78mL OMNIPAQUE IOHEXOL 300 MG/ML  SOLN  COMPARISON:  08/13/2013  FINDINGS: Small partially loculated posterior right pleural effusion. Trace left pleural effusion. Areas of scarring in fibrosis in the lung bases. Heart is borderline in size.  Prior cholecystectomy. Liver, spleen, pancreas, adrenals and left kidney are unremarkable. Right kidney is absent compatible with prior nephrectomy.  Drainage catheter is in place within the pelvis in the area of prior diverticular abscess. The area drained by the catheter has nearly completely resolved. There are small separate gas and fluid collections, measuring up to 2.4 cm on image 73 (previously 3.2 cm and a second gas and fluid collection measuring up to 2.8 cm on image 74 compared with 3.5 cm previously. Inflammatory stranding continues around the sigmoid colon, improved. No evidence of obstruction. Small bowel is decompressed. No free air or free fluid.  Aorta and iliac vessels are  heavily calcified, non aneurysmal. No acute bony abnormality.  IMPRESSION: Near complete resolution of the catheter drained abscess adjacent to the sigmoid colon. There are 2 separate smaller fluid collections adjacent to the sigmoid colon which have decreased in size since prior study.  Continued mild inflammatory stranding around the sigmoid colon, improved since prior study.  Small bilateral pleural  effusions, partially loculated posterior laterally on the right.   Electronically Signed   By: Rolm Baptise M.D.   On: 08/18/2013 11:52   Ct Abdomen Pelvis W Contrast  08/13/2013   CLINICAL DATA:  Abdominal pain with nausea and weight loss. History of renal cell carcinoma.  EXAM: CT ABDOMEN AND PELVIS WITH CONTRAST  TECHNIQUE: Multidetector CT imaging of the abdomen and pelvis was performed using the standard protocol following bolus administration of intravenous contrast.  CONTRAST:  75 ml Omnipaque 300.  COMPARISON:  Abdominal pelvic CT 05/13/2012.  FINDINGS: There is stable scarring at the right lung base, including a nodular component in the right lower lobe measuring 1.4 cm on image 17. There is no significant pleural or pericardial effusion.  The gallbladder is surgically absent. There is no significant biliary dilatation. The liver and spleen appear unremarkable. The pancreas is diffusely atrophied without focal abnormality.  The right kidney is surgically absent. There is no mass within the nephrectomy bed. The left kidney appears normal. Both adrenal glands appear normal.  The stomach, small bowel and proximal colon demonstrate no significant findings. There is extensive sigmoid colon diverticulitis with wall thickening and surrounding inflammation. There are multiple extraluminal air fluid collections surrounding the sigmoid colon. These measure 3.5 x 2.7 cm along the left aspect of the proximal sigmoid colon (image 74) and 4.5 x 1.9 cm more distally along the left aspect of the sigmoid colon. Within the  pelvic cul-de-sac, there is a 3.1 x 4.8 cm air-fluid collection on image 77. The contrast material has not yet passed through the sigmoid colon. The uterus and ovaries appear normal. The bladder is nearly empty, but demonstrates no pneumatosis. A degree of pelvic floor relaxation is suspected. There is no evidence of bowel obstruction or pneumoperitoneum.  Chronic L1 compression deformity is unchanged. There are no worrisome osseous findings. Subchondral sclerosis anteriorly in the right femoral head is stable, potentially reflecting chronic avascular necrosis.  IMPRESSION: 1. Acute sigmoid diverticulitis with multiple pericolonic abscesses. 2. No evidence of free intraperitoneal air or bowel obstruction. 3. No evidence of metastatic renal cell carcinoma. The right nephrectomy bed and left kidney have stable appearances. 4. These results will be called to the ordering clinician or representative by the Radiology Department at the imaging location. Patient has been detained on site for subsequent disposition.   Electronically Signed   By: Camie Patience M.D.   On: 08/13/2013 12:36   Ct Image Guided Drainage By Percutaneous Catheter  08/15/2013   INDICATION: Diverticular abscess. Please attempted CT-guided drainage of the dominant collection located within the lower pelvis.  EXAM: CT IMAGE GUIDED DRAINAGE BY PERCUTANEOUS CATHETER  COMPARISON:  CT the abdomen pelvis - 08/13/2013  MEDICATIONS: The patient is currently admitted to the hospital and receiving intravenous antibiotics. The antibiotics were administered within an appropriate time frame prior to the initiation of the procedure.  ANESTHESIA/SEDATION: Fentanyl 75 mcg IV; Versed 2 mg IV  Total Moderate Sedation time  25 minutes  CONTRAST:  None  COMPLICATIONS: None immediate  PROCEDURE: Informed written consent was obtained from the patient after a discussion of the risks, benefits and alternatives to treatment. The patient was placed prone on the CT gantry and  a pre procedural CT was performed re-demonstrating the known abdominal abscess/fluid collection within the lower pelvis, which appeared minimally decreased in size in the interval, currently measuring approximately 2.9 x 1.9 cm (image 40, series 2), previously, 4.8 x 3.1 cm.  Of note, the additional smaller diverticular  abscesses within the left hemipelvis also appear to have decreased in size in interval measuring approximately 1.8 x 3.2 cm, previously, 1.9 x 4.5 cm, and approximately 1.2 x 2.3 cm, previously 3.5 x 2.7 cm (both smaller abscesses seen on image 30, series 2).  The decision was made to perform CT guided drainage of the residual dominant collection within the lower pelvis. The procedure was planned. A timeout was performed prior to the initiation of the procedure.  The left buttocks was prepped and draped in the usual sterile fashion. The overlying soft tissues were anesthetized with 1% lidocaine with epinephrine. Appropriate trajectory was planned with the use of a 22 gauge spinal needle. An 18 gauge trocar needle was advanced into the abscess/fluid collection and a short Amplatz super stiff wire was coiled within the collection. Appropriate positioning was confirmed with a limited CT scan. The tract was serially dilated allowing placement of a 10 [place] all-purpose drainage catheter. Appropriate positioning was confirmed with a limited postprocedural CT scan.  Approximately 5 cc of purulent fluid was aspirated. The tube was connected to a drainage bag and sutured in place. A dressing was placed. The patient tolerated the procedure well without immediate post procedural complication.  IMPRESSION: 1. Successful CT guided placement of a 10 [place] all purpose drain catheter into the dominant diverticular abscess within the lower pelvis via a left trans gluteal approach with aspiration of 5 mL of purulent fluid. Samples were sent to the laboratory as requested by the ordering clinical team. 2. The two  additional smaller diverticular abscesses with the left hemipelvis have decreased in size and are too small to allow for percutaneous drainage.  PLAN: - Pending drain output, would recommend followup pelvic CT with oral and IV contrast in approximately 5-7 days.  - Recommend fluoroscopic drainage catheter injection prior to removal.   Electronically Signed   By: Sandi Mariscal M.D.   On: 08/15/2013 11:07    Microbiology: Recent Results (from the past 240 hour(s))  CULTURE, ROUTINE-ABSCESS     Status: None   Collection Time    08/15/13 10:33 AM      Result Value Ref Range Status   Specimen Description ABSCESS   Final   Special Requests DIVERTICULAR ABSCESS POST PERC DRAINAGE   Final   Gram Stain     Final   Value: FEW WBC PRESENT,BOTH PMN AND MONONUCLEAR     NO SQUAMOUS EPITHELIAL CELLS SEEN     NO ORGANISMS SEEN     Performed at [facility]   Culture     Final   Value: MULTIPLE ORGANISMS PRESENT, NONE PREDOMINANT     Note: NO STAPHYLOCOCCUS AUREUS ISOLATED NO GROUP A STREP (S.PYOGENES) ISOLATED     Performed at [facility]   Report Status 08/18/2013 FINAL   Final  CLOSTRIDIUM DIFFICILE BY PCR     Status: None   Collection Time    08/18/13 11:56 AM      Result Value Ref Range Status   C difficile by pcr NEGATIVE  NEGATIVE Final     Labs: Basic Metabolic Panel:  Recent Labs Lab 08/15/13 0600 08/16/13 0505 08/17/13 0510 08/17/13 0610 08/18/13 0510 08/19/13 0550  NA 146 145 146  --  144 143  K 2.6* 3.8 3.9  --  4.1 3.7  CL 112 112 114*  --  110 108  CO2 22 24 23   --  25 25  GLUCOSE 83 84 77  --  81 76  BUN 4*  4* 6  --  7 8  CREATININE 0.58 0.58 0.57  --  0.61 0.64  CALCIUM 8.3* 8.5 8.8  --  9.0 9.2  MG  --  1.4*  --   --   --   --   PHOS  --   --   --  2.2*  --   --    Liver Function Tests:  Recent Labs Lab 08/13/13 1413 08/15/13 0600 08/19/13 0550  AST 32 20 29  ALT 11 7 11   ALKPHOS 59 44 57  BILITOT 0.6 0.4 0.5  PROT 7.1 4.9* 5.3*  ALBUMIN  2.6* 1.9* 2.0*   CBC:  Recent Labs Lab 08/13/13 1413  08/15/13 0600 08/16/13 0505 08/17/13 0510 08/18/13 0510 08/19/13 0550  WBC 9.9  < > 5.1 7.1 5.0 5.0 4.8  NEUTROABS 7.3  --   --   --   --   --  2.6  HGB 8.4*  < > 6.6* 9.4* 9.4* 9.7* 9.9*  HCT 25.9*  < > 21.2* 27.8* 28.1* 29.5* 30.3*  MCV 94.2  < > 96.8 90.6 91.2 91.3 93.5  PLT 407*  < > 375 302 291 303 271  < > = values in this interval not displayed.    SignedKaren Kitchens [phone]  [facility] 08/19/2013, 3:24 PM    Patient was seen, examined,treatment plan was discussed with the Physician extender. I have directly reviewed the clinical findings, lab, imaging studies and management of this patient in detail. I have made the necessary changes to the above noted documentation, and agree with the documentation, as recorded by the Physician extender.  Nena [name] MD [facility].

## 2013-08-19 NOTE — Progress Notes (Signed)
Subjective: Looks and feels better this morning  Objective: Vital signs in last 24 hours: Temp:  [98.1 F (36.7 C)-98.7 F (37.1 C)] 98.1 F (36.7 C) (06/03 0553) Pulse Rate:  [68-71] 70 (06/03 0553) Resp:  [18-20] 18 (06/03 0553) BP: (125-149)/(69-79) 149/72 mmHg (06/03 0553) SpO2:  [94 %-96 %] 96 % (06/03 0553) Last BM Date: 08/18/13  Intake/Output from previous day: 06/02 0701 - 06/03 0700 In: 681 [P.O.:666] Out: 30 [Drains:30] Intake/Output this shift:    Abdomen soft and non tender Drain seropurulent  Lab Results:   Recent Labs  08/18/13 0510 08/19/13 0550  WBC 5.0 4.8  HGB 9.7* 9.9*  HCT 29.5* 30.3*  PLT 303 271   BMET  Recent Labs  08/18/13 0510 08/19/13 0550  NA 144 143  K 4.1 3.7  CL 110 108  CO2 25 25  GLUCOSE 81 76  BUN 7 8  CREATININE 0.61 0.64  CALCIUM 9.0 9.2   PT/INR No results found for this basename: LABPROT, INR,  in the last 72 hours ABG No results found for this basename: PHART, PCO2, PO2, HCO3,  in the last 72 hours  Studies/Results: Ct Abdomen Pelvis W Contrast  08/18/2013   CLINICAL DATA:  Lower abdominal pain, left lower quadrant tenderness.  EXAM: CT ABDOMEN AND PELVIS WITH CONTRAST  TECHNIQUE: Multidetector CT imaging of the abdomen and pelvis was performed using the standard protocol following bolus administration of intravenous contrast.  CONTRAST:  60mL OMNIPAQUE IOHEXOL 300 MG/ML  SOLN  COMPARISON:  08/13/2013  FINDINGS: Small partially loculated posterior right pleural effusion. Trace left pleural effusion. Areas of scarring in fibrosis in the lung bases. Heart is borderline in size.  Prior cholecystectomy. Liver, spleen, pancreas, adrenals and left kidney are unremarkable. Right kidney is absent compatible with prior nephrectomy.  Drainage catheter is in place within the pelvis in the area of prior diverticular abscess. The area drained by the catheter has nearly completely resolved. There are small separate gas and fluid  collections, measuring up to 2.4 cm on image 73 (previously 3.2 cm and a second gas and fluid collection measuring up to 2.8 cm on image 74 compared with 3.5 cm previously. Inflammatory stranding continues around the sigmoid colon, improved. No evidence of obstruction. Small bowel is decompressed. No free air or free fluid.  Aorta and iliac vessels are heavily calcified, non aneurysmal. No acute bony abnormality.  IMPRESSION: Near complete resolution of the catheter drained abscess adjacent to the sigmoid colon. There are 2 separate smaller fluid collections adjacent to the sigmoid colon which have decreased in size since prior study.  Continued mild inflammatory stranding around the sigmoid colon, improved since prior study.  Small bilateral pleural effusions, partially loculated posterior laterally on the right.   Electronically Signed   By: Rolm Baptise M.D.   On: 08/18/2013 11:52    Anti-infectives: Anti-infectives   Start     Dose/Rate Route Frequency Ordered Stop   08/18/13 1600  ciprofloxacin (CIPRO) tablet 500 mg     500 mg Oral 2 times daily 08/18/13 1315     08/13/13 2200  ertapenem (INVANZ) 1 g in sodium chloride 0.9 % 50 mL IVPB  Status:  Discontinued     1 g 100 mL/hr over 30 Minutes Intravenous Every 24 hours 08/13/13 1736 08/18/13 1315   08/13/13 1430  piperacillin-tazobactam (ZOSYN) IVPB 3.375 g     3.375 g 100 mL/hr over 30 Minutes Intravenous  Once 08/13/13 1415 08/13/13 1611  Assessment/Plan:  Diverticulitis with abscess  Ok today for discharge from surgery standpoint with her drain in place and on antibiotics.  Will need to follow up with Dr. Hulen Skains next week to consider drain removal.    LOS: 6 days    Harl Bowie 08/19/2013

## 2013-08-20 DIAGNOSIS — D649 Anemia, unspecified: Secondary | ICD-10-CM | POA: Diagnosis not present

## 2013-08-20 DIAGNOSIS — K5733 Diverticulitis of large intestine without perforation or abscess with bleeding: Secondary | ICD-10-CM | POA: Diagnosis not present

## 2013-08-20 DIAGNOSIS — N390 Urinary tract infection, site not specified: Secondary | ICD-10-CM | POA: Diagnosis not present

## 2013-08-20 DIAGNOSIS — C649 Malignant neoplasm of unspecified kidney, except renal pelvis: Secondary | ICD-10-CM | POA: Diagnosis not present

## 2013-08-20 DIAGNOSIS — I4891 Unspecified atrial fibrillation: Secondary | ICD-10-CM | POA: Diagnosis not present

## 2013-08-20 DIAGNOSIS — E43 Unspecified severe protein-calorie malnutrition: Secondary | ICD-10-CM | POA: Diagnosis not present

## 2013-08-20 DIAGNOSIS — M069 Rheumatoid arthritis, unspecified: Secondary | ICD-10-CM | POA: Diagnosis not present

## 2013-08-21 DIAGNOSIS — M069 Rheumatoid arthritis, unspecified: Secondary | ICD-10-CM | POA: Diagnosis not present

## 2013-08-21 DIAGNOSIS — D649 Anemia, unspecified: Secondary | ICD-10-CM | POA: Diagnosis not present

## 2013-08-21 DIAGNOSIS — N390 Urinary tract infection, site not specified: Secondary | ICD-10-CM | POA: Diagnosis not present

## 2013-08-21 DIAGNOSIS — E43 Unspecified severe protein-calorie malnutrition: Secondary | ICD-10-CM | POA: Diagnosis not present

## 2013-08-21 DIAGNOSIS — C649 Malignant neoplasm of unspecified kidney, except renal pelvis: Secondary | ICD-10-CM | POA: Diagnosis not present

## 2013-08-21 DIAGNOSIS — K5733 Diverticulitis of large intestine without perforation or abscess with bleeding: Secondary | ICD-10-CM | POA: Diagnosis not present

## 2013-08-22 DIAGNOSIS — M069 Rheumatoid arthritis, unspecified: Secondary | ICD-10-CM | POA: Diagnosis not present

## 2013-08-22 DIAGNOSIS — N390 Urinary tract infection, site not specified: Secondary | ICD-10-CM | POA: Diagnosis not present

## 2013-08-22 DIAGNOSIS — C649 Malignant neoplasm of unspecified kidney, except renal pelvis: Secondary | ICD-10-CM | POA: Diagnosis not present

## 2013-08-22 DIAGNOSIS — K5733 Diverticulitis of large intestine without perforation or abscess with bleeding: Secondary | ICD-10-CM | POA: Diagnosis not present

## 2013-08-22 DIAGNOSIS — E43 Unspecified severe protein-calorie malnutrition: Secondary | ICD-10-CM | POA: Diagnosis not present

## 2013-08-22 DIAGNOSIS — D649 Anemia, unspecified: Secondary | ICD-10-CM | POA: Diagnosis not present

## 2013-08-24 DIAGNOSIS — M069 Rheumatoid arthritis, unspecified: Secondary | ICD-10-CM | POA: Diagnosis not present

## 2013-08-24 DIAGNOSIS — D649 Anemia, unspecified: Secondary | ICD-10-CM | POA: Diagnosis not present

## 2013-08-24 DIAGNOSIS — N390 Urinary tract infection, site not specified: Secondary | ICD-10-CM | POA: Diagnosis not present

## 2013-08-24 DIAGNOSIS — K5733 Diverticulitis of large intestine without perforation or abscess with bleeding: Secondary | ICD-10-CM | POA: Diagnosis not present

## 2013-08-24 DIAGNOSIS — E43 Unspecified severe protein-calorie malnutrition: Secondary | ICD-10-CM | POA: Diagnosis not present

## 2013-08-24 DIAGNOSIS — C649 Malignant neoplasm of unspecified kidney, except renal pelvis: Secondary | ICD-10-CM | POA: Diagnosis not present

## 2013-08-25 ENCOUNTER — Ambulatory Visit (INDEPENDENT_AMBULATORY_CARE_PROVIDER_SITE_OTHER): Payer: Medicare Other | Admitting: General Surgery

## 2013-08-25 ENCOUNTER — Encounter (INDEPENDENT_AMBULATORY_CARE_PROVIDER_SITE_OTHER): Payer: Self-pay | Admitting: General Surgery

## 2013-08-25 VITALS — BP 124/74 | HR 76 | Temp 97.6°F | Ht 64.0 in | Wt 104.0 lb

## 2013-08-25 DIAGNOSIS — K632 Fistula of intestine: Secondary | ICD-10-CM | POA: Diagnosis not present

## 2013-08-25 NOTE — Progress Notes (Addendum)
Subjective:     Patient ID: Ashley [name], female   DOB: 30-Apr-1928, 80 y.o.   MRN: [id number]  HPI The patient comes in today feeling very weak and not eating very well. She states that she eats minimal amount and is still losing weight.  Review of Systems Failure to thrive decreased appetite.    Objective:   Physical Exam On examination her abdomen is soft and nontender. She is mildly cachectic. She has a percutaneous drain going through her left gluteal cheek. It is draining liquid stool.    Assessment:     Colocutaneous fistula status post percutaneous drainage transgluteal the left sigmoid diverticulitis.     Plan:     Since the patient is not doing that well I do not fill as of this colocutaneous fistulas don't heal without surgical management.  Plan on readmitting the patient on Thursday for a sigmoid colectomy and a colostomy on Friday. Explained this to the patient and her husband. I think that based on her failure to thrive, malnourishment, and the content of her drain and a colectomy and colostomy in his best next step.   The patient is to stop her anticoagulation today.

## 2013-08-26 ENCOUNTER — Other Ambulatory Visit: Payer: Self-pay | Admitting: General Surgery

## 2013-08-27 ENCOUNTER — Encounter (HOSPITAL_COMMUNITY): Payer: Self-pay | Admitting: General Practice

## 2013-08-27 ENCOUNTER — Other Ambulatory Visit: Payer: Self-pay | Admitting: General Surgery

## 2013-08-27 ENCOUNTER — Inpatient Hospital Stay (HOSPITAL_COMMUNITY)
Admission: AD | Admit: 2013-08-27 | Discharge: 2013-09-04 | DRG: 329 | Disposition: A | Discharge status: Skilled Nursing Facility | Payer: Medicare Other | Source: Ambulatory Visit | Attending: General Surgery | Admitting: General Surgery

## 2013-08-27 DIAGNOSIS — R64 Cachexia: Secondary | ICD-10-CM | POA: Diagnosis present

## 2013-08-27 DIAGNOSIS — D62 Acute posthemorrhagic anemia: Secondary | ICD-10-CM | POA: Diagnosis not present

## 2013-08-27 DIAGNOSIS — Z9181 History of falling: Secondary | ICD-10-CM

## 2013-08-27 DIAGNOSIS — Z9229 Personal history of other drug therapy: Secondary | ICD-10-CM

## 2013-08-27 DIAGNOSIS — Z7901 Long term (current) use of anticoagulants: Secondary | ICD-10-CM | POA: Diagnosis not present

## 2013-08-27 DIAGNOSIS — M069 Rheumatoid arthritis, unspecified: Secondary | ICD-10-CM | POA: Diagnosis present

## 2013-08-27 DIAGNOSIS — E43 Unspecified severe protein-calorie malnutrition: Secondary | ICD-10-CM | POA: Diagnosis present

## 2013-08-27 DIAGNOSIS — Z681 Body mass index (BMI) 19 or less, adult: Secondary | ICD-10-CM | POA: Diagnosis not present

## 2013-08-27 DIAGNOSIS — I4891 Unspecified atrial fibrillation: Secondary | ICD-10-CM | POA: Diagnosis not present

## 2013-08-27 DIAGNOSIS — Z86011 Personal history of benign neoplasm of the brain: Secondary | ICD-10-CM

## 2013-08-27 DIAGNOSIS — R488 Other symbolic dysfunctions: Secondary | ICD-10-CM | POA: Diagnosis not present

## 2013-08-27 DIAGNOSIS — K630 Abscess of intestine: Secondary | ICD-10-CM | POA: Diagnosis not present

## 2013-08-27 DIAGNOSIS — I498 Other specified cardiac arrhythmias: Secondary | ICD-10-CM | POA: Diagnosis not present

## 2013-08-27 DIAGNOSIS — E785 Hyperlipidemia, unspecified: Secondary | ICD-10-CM | POA: Diagnosis present

## 2013-08-27 DIAGNOSIS — H5440 Blindness, one eye, unspecified eye: Secondary | ICD-10-CM | POA: Diagnosis present

## 2013-08-27 DIAGNOSIS — Z79899 Other long term (current) drug therapy: Secondary | ICD-10-CM

## 2013-08-27 DIAGNOSIS — Z48815 Encounter for surgical aftercare following surgery on the digestive system: Secondary | ICD-10-CM | POA: Diagnosis not present

## 2013-08-27 DIAGNOSIS — K5720 Diverticulitis of large intestine with perforation and abscess without bleeding: Secondary | ICD-10-CM | POA: Diagnosis present

## 2013-08-27 DIAGNOSIS — I10 Essential (primary) hypertension: Secondary | ICD-10-CM | POA: Diagnosis present

## 2013-08-27 DIAGNOSIS — D649 Anemia, unspecified: Secondary | ICD-10-CM | POA: Diagnosis not present

## 2013-08-27 DIAGNOSIS — R51 Headache: Secondary | ICD-10-CM | POA: Diagnosis not present

## 2013-08-27 DIAGNOSIS — R634 Abnormal weight loss: Secondary | ICD-10-CM | POA: Diagnosis present

## 2013-08-27 DIAGNOSIS — R55 Syncope and collapse: Secondary | ICD-10-CM | POA: Diagnosis not present

## 2013-08-27 DIAGNOSIS — Z433 Encounter for attention to colostomy: Secondary | ICD-10-CM | POA: Diagnosis not present

## 2013-08-27 DIAGNOSIS — I471 Supraventricular tachycardia, unspecified: Secondary | ICD-10-CM | POA: Diagnosis not present

## 2013-08-27 DIAGNOSIS — K632 Fistula of intestine: Principal | ICD-10-CM | POA: Diagnosis present

## 2013-08-27 DIAGNOSIS — K219 Gastro-esophageal reflux disease without esophagitis: Secondary | ICD-10-CM | POA: Diagnosis present

## 2013-08-27 DIAGNOSIS — Z85528 Personal history of other malignant neoplasm of kidney: Secondary | ICD-10-CM

## 2013-08-27 DIAGNOSIS — E46 Unspecified protein-calorie malnutrition: Secondary | ICD-10-CM | POA: Diagnosis present

## 2013-08-27 DIAGNOSIS — K5732 Diverticulitis of large intestine without perforation or abscess without bleeding: Secondary | ICD-10-CM | POA: Diagnosis present

## 2013-08-27 DIAGNOSIS — Z5189 Encounter for other specified aftercare: Secondary | ICD-10-CM | POA: Diagnosis not present

## 2013-08-27 DIAGNOSIS — E876 Hypokalemia: Secondary | ICD-10-CM | POA: Diagnosis not present

## 2013-08-27 DIAGNOSIS — M6281 Muscle weakness (generalized): Secondary | ICD-10-CM | POA: Diagnosis not present

## 2013-08-27 DIAGNOSIS — Z85820 Personal history of malignant melanoma of skin: Secondary | ICD-10-CM | POA: Diagnosis not present

## 2013-08-27 DIAGNOSIS — K6389 Other specified diseases of intestine: Secondary | ICD-10-CM | POA: Diagnosis not present

## 2013-08-27 HISTORY — DX: Fistula of intestine: K63.2

## 2013-08-27 HISTORY — DX: Essential (primary) hypertension: I10

## 2013-08-27 HISTORY — DX: Diverticulitis of intestine, part unspecified, without perforation or abscess without bleeding: K57.92

## 2013-08-27 LAB — COMPREHENSIVE METABOLIC PANEL
ALBUMIN: 2.7 g/dL — AB (ref 3.5–5.2)
ALK PHOS: 62 U/L (ref 39–117)
ALT: 13 U/L (ref 0–35)
AST: 36 U/L (ref 0–37)
BUN: 7 mg/dL (ref 6–23)
CO2: 29 mEq/L (ref 19–32)
Calcium: 9.9 mg/dL (ref 8.4–10.5)
Chloride: 96 mEq/L (ref 96–112)
Creatinine, Ser: 0.72 mg/dL (ref 0.50–1.10)
GFR calc Af Amer: >90 mL/min (ref >90)
GFR calc non Af Amer: 78 mL/min — ABNORMAL LOW (ref >90)
Glucose, Bld: 106 mg/dL — ABNORMAL HIGH (ref 70–99)
POTASSIUM: 3.1 meq/L — AB (ref 3.7–5.3)
Sodium: 140 mEq/L (ref 137–147)
TOTAL PROTEIN: 6.8 g/dL (ref 6.0–8.3)
Total Bilirubin: 0.5 mg/dL (ref 0.3–1.2)

## 2013-08-27 LAB — SURGICAL PCR SCREEN
MRSA, PCR: NEGATIVE
Staphylococcus aureus: NEGATIVE

## 2013-08-27 LAB — MAGNESIUM: Magnesium: 1.5 mg/dL (ref 1.5–2.5)

## 2013-08-27 LAB — PROTIME-INR
INR: 1.2 (ref 0.00–1.49)
Prothrombin Time: 14.9 seconds (ref 11.6–15.2)

## 2013-08-27 LAB — APTT: APTT: 35 s (ref 24–37)

## 2013-08-27 MED ORDER — POLYVINYL ALCOHOL 1.4 % OP SOLN
1.0000 [drp] | Freq: Two times a day (BID) | OPHTHALMIC | Status: DC
  Administered 2013-08-27 – 2013-08-29 (×6): 1 [drp] via OPHTHALMIC
  Filled 2013-08-27 (×2): qty 15

## 2013-08-27 MED ORDER — APRACLONIDINE HCL 0.5 % OP SOLN
1.0000 [drp] | Freq: Two times a day (BID) | OPHTHALMIC | Status: DC
  Filled 2013-08-27 (×3): qty 5

## 2013-08-27 MED ORDER — METRONIDAZOLE IN NACL 5-0.79 MG/ML-% IV SOLN: 500.0000 mg | Freq: Three times a day (TID) | INTRAVENOUS | Status: DC

## 2013-08-27 MED ORDER — ADULT MULTIVITAMIN W/MINERALS CH
1.0000 | ORAL_TABLET | Freq: Every day | ORAL | Status: DC
  Administered 2013-08-28 – 2013-09-03 (×7): 1 via ORAL
  Filled 2013-08-27 (×8): qty 1

## 2013-08-27 MED ORDER — DIPHENHYDRAMINE HCL 50 MG/ML IJ SOLN: 12.5000 mg | Freq: Four times a day (QID) | INTRAMUSCULAR | Status: DC | PRN

## 2013-08-27 MED ORDER — SULFASALAZINE 500 MG PO TABS
500.0000 mg | ORAL_TABLET | Freq: Two times a day (BID) | ORAL | Status: DC
  Administered 2013-08-27 – 2013-09-04 (×16): 500 mg via ORAL
  Filled 2013-08-27 (×17): qty 1

## 2013-08-27 MED ORDER — ONDANSETRON HCL 4 MG/2ML IJ SOLN: 4.0000 mg | Freq: Four times a day (QID) | INTRAMUSCULAR | Status: DC | PRN

## 2013-08-27 MED ORDER — HEPARIN SODIUM (PORCINE) 5000 UNIT/ML IJ SOLN
5000.0000 [IU] | Freq: Three times a day (TID) | INTRAMUSCULAR | Status: DC
  Administered 2013-08-27 (×2): 5000 [IU] via SUBCUTANEOUS
  Filled 2013-08-27 (×4): qty 1

## 2013-08-27 MED ORDER — CIPROFLOXACIN IN D5W 400 MG/200ML IV SOLN: 400.0000 mg | Freq: Two times a day (BID) | INTRAVENOUS | Status: DC

## 2013-08-27 MED ORDER — POTASSIUM CHLORIDE IN NACL 40-0.9 MEQ/L-% IV SOLN
INTRAVENOUS | Status: DC
  Administered 2013-08-27 – 2013-08-28 (×2): 75 mL/h via INTRAVENOUS
  Filled 2013-08-27 (×3): qty 1000

## 2013-08-27 MED ORDER — POTASSIUM CHLORIDE IN NACL 20-0.9 MEQ/L-% IV SOLN: INTRAVENOUS | Status: DC

## 2013-08-27 MED ORDER — KETOROLAC TROMETHAMINE 0.5 % OP SOLN
1.0000 [drp] | Freq: Two times a day (BID) | OPHTHALMIC | Status: DC
  Administered 2013-08-27 – 2013-09-04 (×16): 1 [drp] via OPHTHALMIC
  Filled 2013-08-27 (×2): qty 3

## 2013-08-27 MED ORDER — BOOST / RESOURCE BREEZE PO LIQD
1.0000 | Freq: Three times a day (TID) | ORAL | Status: DC
  Administered 2013-08-27 – 2013-09-02 (×20): 1 via ORAL

## 2013-08-27 MED ORDER — METHYLPREDNISOLONE 4 MG PO TABS
4.0000 mg | ORAL_TABLET | Freq: Every day | ORAL | Status: DC
  Administered 2013-08-27 – 2013-09-04 (×9): 4 mg via ORAL
  Filled 2013-08-27 (×9): qty 1

## 2013-08-27 MED ORDER — MORPHINE SULFATE 2 MG/ML IJ SOLN
1.0000 mg | INTRAMUSCULAR | Status: DC | PRN
  Administered 2013-08-28 – 2013-08-29 (×7): 2 mg via INTRAVENOUS
  Administered 2013-08-30 (×2): 1 mg via INTRAVENOUS
  Administered 2013-08-30 – 2013-09-02 (×5): 2 mg via INTRAVENOUS
  Filled 2013-08-27 (×15): qty 1

## 2013-08-27 MED ORDER — HYDROCORTISONE 2.5 % RE CREA: TOPICAL_CREAM | Freq: Two times a day (BID) | RECTAL | Status: DC | PRN

## 2013-08-27 MED ORDER — POTASSIUM CHLORIDE CRYS ER 20 MEQ PO TBCR
20.0000 meq | EXTENDED_RELEASE_TABLET | Freq: Once | ORAL | Status: AC
  Administered 2013-08-27: 20 meq via ORAL
  Filled 2013-08-27: qty 1

## 2013-08-27 MED ORDER — DIPHENHYDRAMINE HCL 12.5 MG/5ML PO ELIX
12.5000 mg | ORAL_SOLUTION | Freq: Four times a day (QID) | ORAL | Status: DC | PRN
  Administered 2013-09-02: 12.5 mg via ORAL
  Filled 2013-08-27: qty 10

## 2013-08-27 MED ORDER — PANTOPRAZOLE SODIUM 40 MG IV SOLR
40.0000 mg | Freq: Every day | INTRAVENOUS | Status: DC
  Administered 2013-08-27 – 2013-09-01 (×6): 40 mg via INTRAVENOUS
  Filled 2013-08-27 (×9): qty 40

## 2013-08-27 MED ORDER — KCL IN DEXTROSE-NACL 20-5-0.45 MEQ/L-%-% IV SOLN
INTRAVENOUS | Status: DC
  Administered 2013-08-27: 12:00:00 via INTRAVENOUS
  Filled 2013-08-27 (×2): qty 1000

## 2013-08-27 MED ORDER — LATANOPROST 0.005 % OP SOLN
1.0000 [drp] | Freq: Every day | OPHTHALMIC | Status: DC
  Administered 2013-08-27 – 2013-09-03 (×8): 1 [drp] via OPHTHALMIC
  Filled 2013-08-27 (×2): qty 2.5

## 2013-08-27 MED ORDER — SODIUM CHLORIDE 0.9 % IV SOLN
1.0000 g | INTRAVENOUS | Status: DC
  Administered 2013-08-27 – 2013-09-02 (×7): 1 g via INTRAVENOUS
  Administered 2013-09-02: 13:00:00 via INTRAVENOUS
  Filled 2013-08-27 (×8): qty 1

## 2013-08-27 MED ORDER — PANTOPRAZOLE SODIUM 40 MG IV SOLR: 40.0000 mg | Freq: Every day | INTRAVENOUS | Status: DC

## 2013-08-27 NOTE — Consult Note (Signed)
Anesthesiology Pre-op Note:  80 year old female scheduled for sigmoid colectomy and closure of colo-cutaneous fistula tomorrow by Dr. Hulen Skains. She was originally admitted on 08/13/13 with acute sigmoid diverticulitis with a large abscess. This was  treated with a percutaneous gluteal drain and antibiotics. She was discharged home on 08/19/13 and upon follow-up has weakness, decreased appetite, weight loss and a colo-cutaneous fistula.  Pat medical history is significant for rheumatoid arthritis,  paroxysmal atrial fibrillation with a history of syncopal episodes. A loop recorder has not shown any episodes of bradycardia severe enough to warrant a pacemaker. The loop recorder has demonstrated inermittant atrial fibrillation but she remains in sinus rhythm predominantly. She has been on eliquis and last took it on 08/25/13. She denies angina or SOB.  Her medical history is also significant for rheumatoid arthritis treated with sulfasalazine and solumedrol 4 mg per day,  Previous renal cell ca treated with L. Nephrectomy, choroid melanoma treated with L. Enucleation, and previous craniotomy for meningioma.   Exam   Alert,cachectic AA female in no acute distress  VS: T- 36.9 BP-144/77 RR-18 HR 93  HEENT: opens mouth mouth adequately, edentulous upper, partial lower  Heart RRR without murmurs Lungs- clear anteriorly, no wheezing  Labs K-3.1 BUN/CR 7/0.72 glucose 106 LFTs normal, CBC pending  Plan general anesthesia with arterial line and perioperative steroid coverage. The anesthetic plan was discussed with Ms. Arvelo and all questions were answered.  Roberts Gaudy, MD

## 2013-08-27 NOTE — H&P (Signed)
DR WYATT TO SEE IN AM AND OPERATE ON HER.  WILL BE FOLLOWED BY DOW POST OP.

## 2013-08-27 NOTE — Progress Notes (Signed)
INITIAL NUTRITION ASSESSMENT  DOCUMENTATION CODES Per approved criteria  -Severe malnutrition in the context of chronic illness -Underweight   Pt meets criteria for severe MALNUTRITION in the context of chronic illness as evidenced by weight loss of 13.4% x 3 months and severe depletion of muscle mass.   INTERVENTION:  Diet advancement per MD   Resource Breeze TID, each supplement providing 250 kcal and 9 grams of protein   RD to continue to follow nutrition care plan   NUTRITION DIAGNOSIS: Unintended weight loss related to poor appetite, poor PO intake as evidenced by weight loss of 13.4% x 3 months.   Goal: Pt to meet >/=90% of estimated nutrition needs  Monitor:  Diet advancement, PO intake, supplement acceptance, weight trend, labs   Reason for Assessment: Positive Malnutrition Screening Tool Score   80 y.o. female  Admitting Dx: Colocutaneous fistula, s/p transgluteal drain for sigmoid diverticulitis.  ASSESSMENT: 80 year old female scheduled for sigmoid colectomy and closure of colo-cutaneous fistula 6/12 by Dr. Hulen Skains. She was originally admitted on 08/13/13 with acute sigmoid diverticulitis with a large abscess. This was treated with a percutaneous gluteal drain and antibiotics. She was discharged home on 08/19/13 and upon follow-up has weakness, decreased appetite, weight loss and a colo-cutaneous fistula.  Pt reports poor appetite and poor PO intake for several months. Her husband cooks for her and she stated that she tries to eat things like baked chicken, pinto beans, green beans. Encouraged pt to eat colder bland foods when she doesn't have an appetite for better acceptance.   Pt reports her usual weight to be 145 lbs. The last time she weighed this was several months ago. Per Epic chart pt has weight loss of 13.4% x 3 months, which is severe for time frame.   Nutrition Focused Physical Exam:  Subcutaneous Fat:  Orbital Region: moderate depletion Upper Arm Region:  moderate-severe depletion Thoracic and Lumbar Region: n/a  Muscle:  Temple Region: severe depletion Clavicle Bone Region: severe depletion  Clavicle and Acromion Bone Region: severe depletion Scapular Bone Region: severe depletion Dorsal Hand: n/a Patellar Region: severe depletion Anterior Thigh Region: severe depletion  Posterior Calf Region: severe depletion   Edema: facial      Height: Ht Readings from Last 1 Encounters:  08/27/13 5\' 4"  (1.626 m)    Weight: Wt Readings from Last 1 Encounters:  08/27/13 103 lb 11.2 oz (47.038 kg)    Ideal Body Weight: 54.5 kg   % Ideal Body Weight: 86%   Wt Readings from Last 10 Encounters:  08/27/13 103 lb 11.2 oz (47.038 kg)  08/25/13 104 lb (47.174 kg)  08/13/13 106 lb 8 oz (48.308 kg)  05/26/13 119 lb 12.8 oz (54.341 kg)  05/28/12 139 lb 1.6 oz (63.095 kg)  05/21/12 137 lb (62.143 kg)  05/21/12 137 lb (62.143 kg)  12/11/11 130 lb 12.8 oz (59.33 kg)  05/22/11 128 lb (58.06 kg)  11/03/10 125 lb 12.8 oz (57.063 kg)    Usual Body Weight: 145 lbs, per pt   % Usual Body Weight: 71%   BMI:  Body mass index is 17.79 kg/(m^2). Underweight   Estimated Nutritional Needs: Kcal: 1300 - 1500  Protein: >/= 70 grams  Fluid: >/= 1.5 L/day   Skin: intact, facial edema   Diet Order: Clear Liquid  EDUCATION NEEDS: -No education needs identified at this time  No intake or output data in the 24 hours ending 08/27/13 1449  Last BM: PTA   Labs:   Recent  Labs Lab 08/27/13 1104  NA 140  K 3.1*  CL 96  CO2 29  BUN 7  CREATININE 0.72  CALCIUM 9.9  GLUCOSE 106*    CBG (last 3)  No results found for this basename: GLUCAP,  in the last 72 hours  Scheduled Meds: . apraclonidine  1 drop Both Eyes BID  . ertapenem  1 g Intravenous Q24H  . feeding supplement (RESOURCE BREEZE)  1 Container Oral TID PC & HS  . heparin  5,000 Units Subcutaneous 3 times per day  . ketorolac  1 drop Both Eyes BID  . latanoprost  1 drop Both  Eyes QHS  . methylPREDNISolone  4 mg Oral Daily  . pantoprazole (PROTONIX) IV  40 mg Intravenous QHS  . polyvinyl alcohol  1 drop Both Eyes BID  . sulfaSALAzine  500 mg Oral BID    Continuous Infusions: . dextrose 5 % and 0.45 % NaCl with KCl 20 mEq/L 90 mL/hr at 08/27/13 1214    Past Medical History  Diagnosis Date  . Syncope   . Uterine prolapse   . Cryptococcal pneumonitis     right lower lobe  . GERD (gastroesophageal reflux disease)   . Asthma   . LPFXTKWI(097.3)     "monthly" (08/13/2013)  . Rheumatoid arthritis(714.0)   . Arthritis     "all over" (08/13/2013)  . Choroid melanoma of left eye   . Renal cell carcinoma     Left kidney  . Colocutaneous fistula   . Diverticulitis   . Hypertension     Past Surgical History  Procedure Laterality Date  . Nephrectomy Left     native  . Bladder suspension    . Lung removal, partial    . Cholecystectomy    . Brain meningioma excision    . Loop recorder implant      Left Breast  . Colonoscopy N/A 05/21/2012    Procedure: COLONOSCOPY;  Surgeon: Arta Silence, MD;  Location: [facility];  Service: Endoscopy;  Laterality: N/A;  . Tonsillectomy    . Eye surgery Left     Choroid melanoma    Carrolyn Leigh, BS Dietetic Intern Pager: [phone]

## 2013-08-27 NOTE — Consult Note (Addendum)
WOC consult requested by [facility] team for pre-surgical stoma site marking.  Pt plans for colostomy surgery to be performed on 6/12.   Assessed pt while lying and sitting; felt very weak and did not stand.  Mark placed within rectus muscles, in line of vision, to area free from folds. Pt has 2 significant areas of skin folds located above and below where stoma site marking was located that should be avoided if possible. Elta Guadeloupe is located 7 cm to left of umbilicus and 3 cm below, to LLQ.  Briefly discussed pouching routines and demonstrated pouch appearance to patient.  Educational materials left at bedside. Asking appropriate questions. [place] team members will plan to follow post-op after surgery for teaching sessions regarding ostomy care. Julien Girt MSN, RN, [place], [place], [place]

## 2013-08-27 NOTE — Progress Notes (Signed)
Agree with dietetic intern assessment. Will also add daily Multivitamin with minerals as pt meets malnutrition criteria and has plans for surgery.  Pryor Ochoa RD, LDN Inpatient Clinical Dietitian Pager: [phone] After Hours Pager: [phone]

## 2013-08-27 NOTE — H&P (Signed)
Ashley [name] is an 80 y.o. female.   PCP:  Leonides Sake, MD Cardiology:  Dr. Cristopher Peru Chief Complaint:  Colocutaneous fistula, s/p transgluteal drain for sigmoid diverticulitis.  HPI: Pt was admitted on 08/13/13 with Sigmoid diverticulitis and 3 abscess collections.  She was placed on Invanz, and IR placed a drain.  She was maintained on IV Invanz.  Repeat CT showed: Near complete resolution of the catheter drained abscess adjacent to the sigmoid colon. There are 2 separate smaller fluid collections adjacent to the sigmoid colon which have decreased in size since  prior study.  She was discharged home on 6/3 with the drain and oral antibiotics.  She has had ongoing weight loss for some time, but continues to loss weight.  She has no appetite, her husband does the cooking. She continues to have some discomfort LLQ and mid lower abdomen.  She returned for follow up on 08/25/13, to see Dr. Hulen Skains.  Exam show ongoing drainage from the current transgluteal drain that is feculent. It is his opinion she has a colocutaneous fistula and needs colectomy and colostomy.  She is admitted now for surgery in the AM.    Past Medical History  Diagnosis Date  Renal cell carcinoma    Left kidney     Choroid melanoma of left eye     Rheumatoid arthritis on Medrol and sulfasalazine   Atrial fibrillation on chronic anticoagulation  (Eliquis stopped on 08/25/13)   Anemia   Weight loss/protien calorie malnutrition   Diverticulitis   Meningioma with excision   Hx of Syncope and bradycardia s/p loop recorder placement, 2011 Dr. Cristopher Peru.  Episode in 2011   Uterine prolapse   Cryptococcal pneumonitis   right lower lobe  GERD (gastroesophageal reflux disease)   Hx of rectal bleeding from internal hemorrhoids.05/2012   Dyslipidemia     Hx of Asthma   Headache(784.0)    "monthly" (08/13/2013)    Hx of lobectomy, but she does not remember why     Past Surgical History  Procedure Laterality Date  .  Nephrectomy Left     native  . Bladder suspension    . Lung removal, partial    . Cholecystectomy    . Brain meningioma excision    . Loop recorder implant      Left Breast  . Colonoscopy N/A 05/21/2012    Procedure: COLONOSCOPY;  Surgeon: Arta Silence, MD;  Location: [facility];  Service: Endoscopy;  Laterality: N/A;  . Tonsillectomy    . Eye surgery Left     Choroid melanoma    Family History  Problem Relation Age of Onset  . Heart disease Maternal Aunt    Social History:  reports that she has never smoked. She has never used smokeless tobacco. She reports that she does not drink alcohol or use illicit drugs.  Allergies: No Known Allergies  Medications Prior to Admission  Medication Sig Dispense Refill  . apixaban (ELIQUIS) 2.5 MG TABS tablet Take 1 tablet (2.5 mg total) by mouth 2 (two) times daily.  60 tablet  3  . apraclonidine (IOPIDINE) 0.5 % ophthalmic solution Place 1 drop into both eyes 2 (two) times daily.      . bromfenac (XIBROM) 0.09 % ophthalmic solution Place 1 drop into both eyes 2 (two) times daily.       . Calcium Carbonate (CALCIUM 600 PO) Take 600 mg by mouth 2 (two) times daily.       . carboxymethylcellulose (REFRESH TEARS) 0.5 %  SOLN Place 1 drop into both eyes 2 (two) times daily.      . Cholecalciferol (VITAMIN D-3) 5000 UNITS TABS Take 1 tablet by mouth daily.      . ciprofloxacin (CIPRO) 500 MG tablet Take 1 tablet (500 mg total) by mouth 2 (two) times daily.  14 tablet  0  . cycloSPORINE (RESTASIS) 0.05 % ophthalmic emulsion Place 1 drop into both eyes 2 (two) times daily.        . dorzolamide (TRUSOPT) 2 % ophthalmic solution Place 1 drop into both eyes 2 (two) times daily.       . feeding supplement, ENSURE COMPLETE, (ENSURE COMPLETE) LIQD Take 237 mLs by mouth 2 (two) times daily between meals.  60 Bottle  5  . fenofibrate (TRICOR) 145 MG tablet Take 145 mg by mouth daily.      . hydrochlorothiazide (MICROZIDE) 12.5 MG capsule Take 12.5 mg by  mouth daily.      . hydrocortisone (ANUSOL-HC) 2.5 % rectal cream Place rectally 2 (two) times daily as needed for hemorrhoids.  30 g  0  . latanoprost (XALATAN) 0.005 % ophthalmic solution Place 1 drop into both eyes at bedtime.      . methylPREDNISolone (MEDROL) 4 MG tablet Take 4 mg by mouth daily.       . Multiple Vitamin (MULTIVITAMIN) capsule Take 1 capsule by mouth daily.        Marland Kitchen omega-3 acid ethyl esters (LOVAZA) 1 G capsule Take 1 g by mouth daily.       . polyethylene glycol (MIRALAX / GLYCOLAX) packet Take 17 g by mouth daily.  14 each  0  . sulfaSALAzine (AZULFIDINE) 500 MG tablet Take 500 mg by mouth 2 (two) times daily.      . trazodone (DESYREL) 300 MG tablet Take 300 mg by mouth at bedtime.          No results found for this or any previous visit (from the past 48 hour(s)). No results found.  Review of Systems  Constitutional: Positive for weight loss (down to 102) and malaise/fatigue. Negative for fever, chills and diaphoresis.       She just cannot eat, she is not hungry at all.  HENT: Negative.   Eyes: Negative for photophobia, pain, discharge and redness.       Blind left eye.  Respiratory: Negative.   Cardiovascular: Negative.   Gastrointestinal: Positive for heartburn (some), nausea (occasionally with breakfast.), abdominal pain (pain LLQ and mid lower abdomen) and constipation (some). Negative for vomiting, diarrhea, blood in stool and melena.  Musculoskeletal: Negative.   Skin: Positive for rash.  Neurological: Positive for dizziness (occasional).  Endo/Heme/Allergies: Negative.   Psychiatric/Behavioral: Negative.     Blood pressure 133/79, pulse 90, temperature 98.2 F (36.8 C), temperature source Oral, resp. rate 18, height 5\' 4"  (1.626 m), weight 47.038 kg (103 lb 11.2 oz), SpO2 98.00%. Physical Exam  Constitutional: She is oriented to person, place, and time.  Thin cachectic elderly female, in NAD She appears very malnourished.  HENT:  Head:  Normocephalic and atraumatic.  Nose: Nose normal.  Mouth/Throat: No oropharyngeal exudate.  Eyes: Conjunctivae and EOM are normal. Pupils are equal, round, and reactive to light. Right eye exhibits no discharge. Left eye exhibits no discharge. No scleral icterus.  Neck: Normal range of motion. Neck supple. No JVD present. No tracheal deviation present. No thyromegaly present.  Cardiovascular: Normal rate, regular rhythm, normal heart sounds and intact distal pulses.  Exam reveals no gallop.  No murmur heard. Respiratory: Effort normal and breath sounds normal. No respiratory distress. She has no wheezes. She has no rales. She exhibits no tenderness.  GI: Soft. Bowel sounds are normal. She exhibits no distension and no mass. There is tenderness (tender LLQ and mid abdomen). There is no rebound and no guarding.  Left transgluteal drain with feculent appearing fluid in the bag.  Musculoskeletal: Normal range of motion. She exhibits no edema and no tenderness.  Lymphadenopathy:    She has no cervical adenopathy.  Neurological: She is alert and oriented to person, place, and time. No cranial nerve deficit.  Skin: Skin is warm and dry. No rash noted. No erythema. No pallor.  Psychiatric: She has a normal mood and affect. Her behavior is normal. Judgment and thought content normal.     Assessment/Plan 1.  Colocutaneous fistula s/p transgluteal drain for diverticulitis with abscess 2.  Hx of renal cell carcinoma on the left with nephrectomy 3.  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 4.  Rheumatoid arthritis on Medrol and sulfasalazine  5.  Atrial fibrillation on chronic anticoagulation (Eliquis stopped on 08/25/13)  6.  Hx of Syncope and bradycardia s/p loop recorder placement, 2011 Dr. Cristopher Peru.  Episode in 2011  7.  Weight loss/protien calorie malnutrition  8.  Hx of anemia 9.  Hx of rectal bleeding from internal hemorrhoids.05/2012  10.  Dyslipidemia  11.  Hx of  asthma/pneumonia 12.  Hx of UTI 13.  Hx of monthly headaches 14.  Hx of left lobectomy   Plan:  We are getting her set up for surgery tomorrow.  Anesthesia and Ostomy nurse will see.  Restarting IV antibiotics, hydration before surgery.  I will put her on telemetry and have ordered a prealbumin to be done with other labs.  She may require some IV TNA, she reports ongoing weight loss and lack of appetite.    Byren Pankow 08/27/2013, 10:57 AM

## 2013-08-28 ENCOUNTER — Encounter (HOSPITAL_COMMUNITY): Payer: Medicare Other | Admitting: Anesthesiology

## 2013-08-28 ENCOUNTER — Inpatient Hospital Stay (HOSPITAL_COMMUNITY): Payer: Medicare Other | Admitting: Anesthesiology

## 2013-08-28 ENCOUNTER — Encounter (HOSPITAL_COMMUNITY): Payer: Self-pay | Admitting: Anesthesiology

## 2013-08-28 ENCOUNTER — Encounter (HOSPITAL_COMMUNITY)
Admission: AD | Disposition: A | Discharge status: Skilled Nursing Facility | Payer: Medicare Other | Source: Ambulatory Visit

## 2013-08-28 HISTORY — PX: COLOSTOMY: SHX63

## 2013-08-28 HISTORY — PX: COLOSTOMY REVISION: SHX5232

## 2013-08-28 LAB — URINALYSIS, ROUTINE W REFLEX MICROSCOPIC
BILIRUBIN URINE: NEGATIVE
Glucose, UA: NEGATIVE mg/dL
Hgb urine dipstick: NEGATIVE
Ketones, ur: NEGATIVE mg/dL
Leukocytes, UA: NEGATIVE
Nitrite: NEGATIVE
Protein, ur: NEGATIVE mg/dL
SPECIFIC GRAVITY, URINE: 1.006 (ref 1.005–1.030)
Urobilinogen, UA: 0.2 mg/dL (ref 0.0–1.0)
pH: 6.5 (ref 5.0–8.0)

## 2013-08-28 LAB — BASIC METABOLIC PANEL
BUN: 4 mg/dL — ABNORMAL LOW (ref 6–23)
CHLORIDE: 105 meq/L (ref 96–112)
CO2: 22 mEq/L (ref 19–32)
Calcium: 8.8 mg/dL (ref 8.4–10.5)
Creatinine, Ser: 0.52 mg/dL (ref 0.50–1.10)
GFR calc Af Amer: >90 mL/min (ref >90)
GFR calc non Af Amer: 87 mL/min — ABNORMAL LOW (ref >90)
Glucose, Bld: 86 mg/dL (ref 70–99)
Potassium: 5.5 mEq/L — ABNORMAL HIGH (ref 3.7–5.3)
Sodium: 142 mEq/L (ref 137–147)

## 2013-08-28 LAB — CBC
HEMATOCRIT: 28.5 % — AB (ref 36.0–46.0)
HEMOGLOBIN: 9.3 g/dL — AB (ref 12.0–15.0)
MCH: 30.9 pg (ref 26.0–34.0)
MCHC: 32.6 g/dL (ref 30.0–36.0)
MCV: 94.7 fL (ref 78.0–100.0)
Platelets: 252 10*3/uL (ref 150–400)
RBC: 3.01 MIL/uL — AB (ref 3.87–5.11)
RDW: 18.4 % — AB (ref 11.5–15.5)
WBC: 4.8 10*3/uL (ref 4.0–10.5)

## 2013-08-28 LAB — HEMOGLOBIN AND HEMATOCRIT, BLOOD
HEMATOCRIT: 22.5 % — AB (ref 36.0–46.0)
Hemoglobin: 7.3 g/dL — ABNORMAL LOW (ref 12.0–15.0)

## 2013-08-28 LAB — POCT I-STAT 4, (NA,K, GLUC, HGB,HCT)
GLUCOSE: 86 mg/dL (ref 70–99)
HEMATOCRIT: 27 % — AB (ref 36.0–46.0)
HEMOGLOBIN: 9.2 g/dL — AB (ref 12.0–15.0)
Potassium: 3.9 mEq/L (ref 3.7–5.3)
Sodium: 144 mEq/L (ref 137–147)

## 2013-08-28 LAB — PREALBUMIN: PREALBUMIN: 15.1 mg/dL — AB (ref 17.0–34.0)

## 2013-08-28 SURGERY — CREATION, COLOSTOMY
Anesthesia: General | Site: Abdomen

## 2013-08-28 MED ORDER — LIDOCAINE HCL (CARDIAC) 20 MG/ML IV SOLN
INTRAVENOUS | Status: AC
  Filled 2013-08-28: qty 5

## 2013-08-28 MED ORDER — ROCURONIUM BROMIDE 50 MG/5ML IV SOLN
INTRAVENOUS | Status: AC
  Filled 2013-08-28: qty 1

## 2013-08-28 MED ORDER — POVIDONE-IODINE 10 % EX OINT
TOPICAL_OINTMENT | CUTANEOUS | Status: AC
  Filled 2013-08-28: qty 28.35

## 2013-08-28 MED ORDER — LACTATED RINGERS IV SOLN
INTRAVENOUS | Status: DC | PRN
  Administered 2013-08-28 (×2): via INTRAVENOUS

## 2013-08-28 MED ORDER — ONDANSETRON HCL 4 MG/2ML IJ SOLN
INTRAMUSCULAR | Status: DC | PRN
  Administered 2013-08-28: 4 mg via INTRAVENOUS

## 2013-08-28 MED ORDER — PROPOFOL 10 MG/ML IV BOLUS
INTRAVENOUS | Status: AC
  Filled 2013-08-28: qty 20

## 2013-08-28 MED ORDER — GLYCOPYRROLATE 0.2 MG/ML IJ SOLN
INTRAMUSCULAR | Status: AC
  Filled 2013-08-28: qty 2

## 2013-08-28 MED ORDER — NEOSTIGMINE METHYLSULFATE 10 MG/10ML IV SOLN
INTRAVENOUS | Status: AC
  Filled 2013-08-28: qty 1

## 2013-08-28 MED ORDER — FENTANYL CITRATE 0.05 MG/ML IJ SOLN
INTRAMUSCULAR | Status: AC
  Filled 2013-08-28: qty 5

## 2013-08-28 MED ORDER — ONDANSETRON HCL 4 MG/2ML IJ SOLN
INTRAMUSCULAR | Status: AC
  Filled 2013-08-28: qty 2

## 2013-08-28 MED ORDER — HEPARIN SODIUM (PORCINE) 5000 UNIT/ML IJ SOLN
5000.0000 [IU] | Freq: Three times a day (TID) | INTRAMUSCULAR | Status: DC
  Administered 2013-08-29 – 2013-08-31 (×7): 5000 [IU] via SUBCUTANEOUS
  Filled 2013-08-28 (×11): qty 1

## 2013-08-28 MED ORDER — LABETALOL HCL 5 MG/ML IV SOLN
INTRAVENOUS | Status: DC | PRN
  Administered 2013-08-28: 5 mg via INTRAVENOUS

## 2013-08-28 MED ORDER — VECURONIUM BROMIDE 10 MG IV SOLR
INTRAVENOUS | Status: AC
  Filled 2013-08-28: qty 10

## 2013-08-28 MED ORDER — ALBUMIN HUMAN 5 % IV SOLN
INTRAVENOUS | Status: DC | PRN
  Administered 2013-08-28 (×2): via INTRAVENOUS

## 2013-08-28 MED ORDER — 0.9 % SODIUM CHLORIDE (POUR BTL) OPTIME
TOPICAL | Status: DC | PRN
  Administered 2013-08-28 (×3): 1000 mL

## 2013-08-28 MED ORDER — FENTANYL CITRATE 0.05 MG/ML IJ SOLN
INTRAMUSCULAR | Status: DC | PRN
  Administered 2013-08-28: 50 ug via INTRAVENOUS
  Administered 2013-08-28: 100 ug via INTRAVENOUS
  Administered 2013-08-28 (×3): 50 ug via INTRAVENOUS
  Administered 2013-08-28: 25 ug via INTRAVENOUS
  Administered 2013-08-28: 50 ug via INTRAVENOUS

## 2013-08-28 MED ORDER — PROPOFOL 10 MG/ML IV BOLUS
INTRAVENOUS | Status: DC | PRN
  Administered 2013-08-28: 100 mg via INTRAVENOUS
  Administered 2013-08-28: 40 mg via INTRAVENOUS

## 2013-08-28 MED ORDER — LIDOCAINE HCL (CARDIAC) 20 MG/ML IV SOLN
INTRAVENOUS | Status: DC | PRN
  Administered 2013-08-28: 60 mg via INTRAVENOUS

## 2013-08-28 MED ORDER — POVIDONE-IODINE 10 % EX OINT
TOPICAL_OINTMENT | CUTANEOUS | Status: DC | PRN
  Administered 2013-08-28: 1 via TOPICAL

## 2013-08-28 MED ORDER — SODIUM CHLORIDE 0.9 % IV SOLN
INTRAVENOUS | Status: DC
  Administered 2013-08-28 (×2): via INTRAVENOUS
  Administered 2013-08-29: 75 mL/h via INTRAVENOUS
  Administered 2013-08-30 – 2013-08-31 (×2): via INTRAVENOUS

## 2013-08-28 MED ORDER — HEMOSTATIC AGENTS (NO CHARGE) OPTIME
TOPICAL | Status: DC | PRN
  Administered 2013-08-28: 1 via TOPICAL

## 2013-08-28 MED ORDER — NEOSTIGMINE METHYLSULFATE 10 MG/10ML IV SOLN
INTRAVENOUS | Status: DC | PRN
  Administered 2013-08-28: 3 mg via INTRAVENOUS

## 2013-08-28 MED ORDER — ROCURONIUM BROMIDE 100 MG/10ML IV SOLN
INTRAVENOUS | Status: DC | PRN
  Administered 2013-08-28: 40 mg via INTRAVENOUS
  Administered 2013-08-28: 10 mg via INTRAVENOUS

## 2013-08-28 MED ORDER — STERILE WATER FOR INJECTION IJ SOLN
INTRAMUSCULAR | Status: AC
  Filled 2013-08-28: qty 10

## 2013-08-28 MED ORDER — LACTATED RINGERS IV SOLN
INTRAVENOUS | Status: DC
  Administered 2013-08-28: 12:00:00 via INTRAVENOUS

## 2013-08-28 MED ORDER — GLYCOPYRROLATE 0.2 MG/ML IJ SOLN
INTRAMUSCULAR | Status: DC | PRN
  Administered 2013-08-28: 0.4 mg via INTRAVENOUS

## 2013-08-28 SURGICAL SUPPLY — 73 items
BLADE SURG ROTATE 9660 (MISCELLANEOUS) IMPLANT
CANISTER SUCTION 2500CC (MISCELLANEOUS) ×4 IMPLANT
CHLORAPREP W/TINT 26ML (MISCELLANEOUS) ×4 IMPLANT
CLAMP POUCH DRAINAGE QUIET (OSTOMY) ×3 IMPLANT
COVER MAYO STAND STRL (DRAPES) ×2 IMPLANT
COVER SURGICAL LIGHT HANDLE (MISCELLANEOUS) ×4 IMPLANT
DRAIN CHANNEL 19F RND (DRAIN) ×3 IMPLANT
DRAPE LAPAROSCOPIC ABDOMINAL (DRAPES) ×4 IMPLANT
DRAPE PROXIMA HALF (DRAPES) ×2 IMPLANT
DRAPE SURG 17X23 STRL (DRAPES) ×6 IMPLANT
DRAPE UTILITY 15X26 W/TAPE STR (DRAPE) ×11 IMPLANT
DRAPE WARM FLUID 44X44 (DRAPE) ×4 IMPLANT
DRSG OPSITE POSTOP 4X10 (GAUZE/BANDAGES/DRESSINGS) IMPLANT
DRSG OPSITE POSTOP 4X8 (GAUZE/BANDAGES/DRESSINGS) ×3 IMPLANT
ELECT BLADE 4.0 EZ CLEAN MEGAD (MISCELLANEOUS) ×4
ELECT BLADE 6.5 EXT (BLADE) ×1 IMPLANT
ELECT CAUTERY BLADE 6.4 (BLADE) ×5 IMPLANT
ELECT REM PT RETURN 9FT ADLT (ELECTROSURGICAL) ×4
ELECTRODE BLDE 4.0 EZ CLN MEGD (MISCELLANEOUS) ×1 IMPLANT
ELECTRODE REM PT RTRN 9FT ADLT (ELECTROSURGICAL) ×2 IMPLANT
EVACUATOR SILICONE 100CC (DRAIN) ×3 IMPLANT
GLOVE BIO SURGEON STRL SZ 6.5 (GLOVE) ×4 IMPLANT
GLOVE BIO SURGEON STRL SZ7.5 (GLOVE) ×3 IMPLANT
GLOVE BIO SURGEONS STRL SZ 6.5 (GLOVE) ×2
GLOVE BIOGEL PI IND STRL 6.5 (GLOVE) ×1 IMPLANT
GLOVE BIOGEL PI IND STRL 7.0 (GLOVE) ×2 IMPLANT
GLOVE BIOGEL PI IND STRL 7.5 (GLOVE) ×2 IMPLANT
GLOVE BIOGEL PI IND STRL 8 (GLOVE) ×3 IMPLANT
GLOVE BIOGEL PI INDICATOR 6.5 (GLOVE) ×2
GLOVE BIOGEL PI INDICATOR 7.0 (GLOVE) ×4
GLOVE BIOGEL PI INDICATOR 7.5 (GLOVE) ×4
GLOVE BIOGEL PI INDICATOR 8 (GLOVE) ×2
GLOVE ECLIPSE 7.5 STRL STRAW (GLOVE) ×5 IMPLANT
GOWN STRL REUS W/ TWL LRG LVL3 (GOWN DISPOSABLE) ×11 IMPLANT
GOWN STRL REUS W/TWL LRG LVL3 (GOWN DISPOSABLE) ×20
HEMOSTAT SNOW SURGICEL 2X4 (HEMOSTASIS) ×3 IMPLANT
KIT BASIN OR (CUSTOM PROCEDURE TRAY) ×4 IMPLANT
KIT OSTOMY DRAINABLE 2.75 STR (WOUND CARE) ×9 IMPLANT
KIT ROOM TURNOVER OR (KITS) ×4 IMPLANT
LEGGING LITHOTOMY PAIR STRL (DRAPES) IMPLANT
LIGASURE IMPACT 36 18CM CVD LR (INSTRUMENTS) ×3 IMPLANT
NS IRRIG 1000ML POUR BTL (IV SOLUTION) ×11 IMPLANT
PACK GENERAL/GYN (CUSTOM PROCEDURE TRAY) ×4 IMPLANT
PAD ARMBOARD 7.5X6 YLW CONV (MISCELLANEOUS) ×4 IMPLANT
PENCIL BUTTON HOLSTER BLD 10FT (ELECTRODE) ×4 IMPLANT
POUCH OSTOMY 1 3/4  H (OSTOMY) ×3 IMPLANT
SPECIMEN JAR MEDIUM (MISCELLANEOUS) ×3 IMPLANT
SPECIMEN JAR X LARGE (MISCELLANEOUS) ×1 IMPLANT
SPONGE GAUZE 4X4 12PLY (GAUZE/BANDAGES/DRESSINGS) ×3 IMPLANT
SPONGE LAP 18X18 X RAY DECT (DISPOSABLE) ×6 IMPLANT
STAPLER CUT CVD 40MM BLUE (STAPLE) ×3 IMPLANT
STAPLER PROXIMATE 75MM BLUE (STAPLE) ×3 IMPLANT
STAPLER VISISTAT 35W (STAPLE) ×4 IMPLANT
SUCTION POOLE TIP (SUCTIONS) ×4 IMPLANT
SURGILUBE 2OZ TUBE FLIPTOP (MISCELLANEOUS) IMPLANT
SUT ETHILON 2 0 FS 18 (SUTURE) ×3 IMPLANT
SUT PDS AB 1 TP1 96 (SUTURE) ×8 IMPLANT
SUT PROLENE 2 0 CT2 30 (SUTURE) ×3 IMPLANT
SUT PROLENE 2 0 KS (SUTURE) IMPLANT
SUT SILK 2 0 SH CR/8 (SUTURE) ×4 IMPLANT
SUT SILK 2 0 TIES 10X30 (SUTURE) ×4 IMPLANT
SUT SILK 3 0 SH CR/8 (SUTURE) ×4 IMPLANT
SUT SILK 3 0 TIES 10X30 (SUTURE) ×4 IMPLANT
SUT VIC AB 3-0 SH 18 (SUTURE) ×3 IMPLANT
SYR BULB IRRIGATION 50ML (SYRINGE) ×1 IMPLANT
TOWEL OR 17X26 10 PK STRL BLUE (TOWEL DISPOSABLE) ×5 IMPLANT
TRAY FOLEY CATH 14FRSI W/METER (CATHETERS) ×3 IMPLANT
TRAY PROCTOSCOPIC FIBER OPTIC (SET/KITS/TRAYS/PACK) IMPLANT
TUBE CONNECTING 12'X1/4 (SUCTIONS)
TUBE CONNECTING 12X1/4 (SUCTIONS) ×1 IMPLANT
UNDERPAD 30X30 INCONTINENT (UNDERPADS AND DIAPERS) IMPLANT
WATER STERILE IRR 1000ML POUR (IV SOLUTION) IMPLANT
YANKAUER SUCT BULB TIP NO VENT (SUCTIONS) ×4 IMPLANT

## 2013-08-28 NOTE — Anesthesia Postprocedure Evaluation (Signed)
  Anesthesia Post-op Note  Patient: Ashley [name]  Procedure(s) Performed: Procedure(s): COLOSTOMY (N/A) COLON RESECTION SIGMOID (N/A)  Patient Location: PACU  Anesthesia Type:General  Level of Consciousness: awake, alert  and oriented  Airway and Oxygen Therapy: Patient Spontanous Breathing and Patient connected to nasal cannula oxygen  Post-op Pain: mild  Post-op Assessment: Post-op Vital signs reviewed, Patient's Cardiovascular Status Stable, Respiratory Function Stable and Patent Airway  Post-op Vital Signs: stable  Last Vitals:  Filed Vitals:   08/28/13 1500  BP: 168/61  Pulse: 57  Temp:   Resp: 22    Complications: No apparent anesthesia complications

## 2013-08-28 NOTE — Interval H&P Note (Signed)
History and Physical Interval Note: Patient with continued feculent drainage from transgluteal percutaneous drain.  K+ low and will replace.  For OR today.   08/28/2013 6:49 AM  Ashley [name]  has presented today for surgery, with the diagnosis of colocutaneous fitsula  The various methods of treatment have been discussed with the patient and family. After consideration of risks, benefits and other options for treatment, the patient has consented to  Procedure(s): COLOSTOMY (N/A) PARTIAL COLECTOMY (N/A) as a surgical intervention .  The patient's history has been reviewed, patient examined, no change in status, stable for surgery.  I have reviewed the patient's chart and labs.  Questions were answered to the patient's satisfaction.     Tex Conroy, Kathryne Eriksson

## 2013-08-28 NOTE — Anesthesia Procedure Notes (Addendum)
Procedure Name: Intubation Date/Time: 08/28/2013 12:22 PM Performed by: Jenne Campus Pre-anesthesia Checklist: Patient identified, Emergency Drugs available, Suction available, Patient being monitored and Timeout performed Patient Re-evaluated:Patient Re-evaluated prior to inductionOxygen Delivery Method: Circle system utilized Preoxygenation: Pre-oxygenation with 100% oxygen Intubation Type: IV induction Ventilation: Mask ventilation without difficulty and Oral airway inserted - appropriate to patient size Laryngoscope Size: Miller and 2 Grade View: Grade I Tube type: Oral Tube size: 7.0 mm Number of attempts: 1 Airway Equipment and Method: Stylet Placement Confirmation: ETT inserted through vocal cords under direct vision,  positive ETCO2,  CO2 detector and breath sounds checked- equal and bilateral Secured at: 21 cm Tube secured with: Tape Dental Injury: Teeth and Oropharynx as per pre-operative assessment

## 2013-08-28 NOTE — Transfer of Care (Signed)
Immediate Anesthesia Transfer of Care Note  Patient: Ashley [name]  Procedure(s) Performed: Procedure(s): COLOSTOMY (N/A) COLON RESECTION SIGMOID (N/A)  Patient Location: PACU  Anesthesia Type:General  Level of Consciousness: awake, oriented and patient cooperative  Airway & Oxygen Therapy: Patient Spontanous Breathing and Patient connected to face mask oxygen  Post-op Assessment: Report given to PACU RN and Post -op Vital signs reviewed and stable  Post vital signs: Reviewed  Complications: No apparent anesthesia complications

## 2013-08-28 NOTE — Op Note (Signed)
OPERATIVE REPORT  DATE OF OPERATION: 08/27/2013 - 08/28/2013  PATIENT:  Ashley [name]  80 y.o. female  PRE-OPERATIVE DIAGNOSIS:  COLOCUTANEOUS FISTULA with acute diverticulitis  POST-OPERATIVE DIAGNOSIS:  COLOCUTANEOUS FISTULA with acute diverticulitis  PROCEDURE:  Procedure(s): COLOSTOMY COLON RESECTION SIGMOID  SURGEON:  Surgeon(s): Gwenyth Ober, MD  ASSISTANT: [place], NP-C  ANESTHESIA:   general  EBL: 300 ml  BLOOD ADMINISTERED: none  DRAINS: (one) Blake drain(s) in the pelvis   SPECIMEN:  Source of Specimen:  sigmoid colon  COUNTS CORRECT:  YES  PROCEDURE DETAILS: The patient was taken to the operating room and placed on the table in the supine position. After an adequate endotracheal anesthetic was administered she was prepped and draped in the usual sterile manner exposing her abdomen.  The patient's abdomen had been marked for left side in left upper quadrant.  A proper timeout was performed identifying the patient and the procedure to be performed.a midline incision was made from the umbilicus down to the pubic crest. It was taken down to and through the midline fascia using electrocautery. The upper portion of the incision was free of adhesions and we were able to enter the peritoneal cavity with minimal difficulty.  The patient was noted to be very oozy from the beginning of the operation. The palpable diverticular disease was mostly in the left low quadrant adherent to the gonadal vessels on the left side. The patient had been noted to have had a previous left nephrectomy however upon review of all her studies it turns out that the patient had a right nephrectomy in her left kidney was seen on the CT scan. Therefore we will very cognizant of and protective of the left ureter.  We dissected out the sigmoid colon from the peritoneal attachments laterally. Are also significant adhesions between the inflamed colon and the adnexa. These were taken down using LigaSure  device and also to also ties and 2-0 silk suture ligatures.  We came across the mid to distal sigmoid colon using a GIA-75 stapler. We came across the mesentery using the LigaSure device and also ligatures. We were able to free the sigmoid colon from the left lateral wall and subsequently we were able to identify the left ureter lateral and posterior to the gonadal vessels. It was completely intact and not injured in the process of dissecting out the sigmoid colon.  The severely inflamed and adherent sigmoid colon was stuck in the pelvis and we had to dissected out using blunt and sharp dissection. We were eventually able to get to a soft portion of the distal sigmoid colon at the perineal reflection where we came across the colon with a Contour stapling device.  This completely detached the colon which was able to separate off the field. As we did so we can see where the percutaneous drain had come into the pelvis in the inferior left posterior portion of the pelvic cavity. We pulled the percutaneous drain into the wound and cut above and subsequently remove the remaining catheter from her gluteal area and after the abdomen was closed and the colostomy was in place.  Once the colon had been removed we were able to mobilize the proximal sigmoid colon and distal descending colon in order to bring the colostomy out the left upper quadrant at the site previously marked. It was mature with 3-0 Vicryl pop-off sutures. Prior to doing so the abdomen was closed using looped #1 PDS suture. The skin was closed using stainless steel staples. All  counts were correct including needles, sponges, and instruments.  PATIENT DISPOSITION:  PACU - hemodynamically stable.   Gwenyth Ober 6/12/20152:27 PM

## 2013-08-28 NOTE — Anesthesia Preprocedure Evaluation (Addendum)
Anesthesia Evaluation  Patient identified by MRN, date of birth, ID band Patient awake    Reviewed: Allergy & Precautions, H&P , NPO status , Patient's Chart, lab work & pertinent test results  Airway Mallampati: I  Neck ROM: Full    Dental   Pulmonary asthma ,  breath sounds clear to auscultation        Cardiovascular hypertension, + dysrhythmias Atrial Fibrillation     Neuro/Psych    GI/Hepatic GERD-  ,  Endo/Other    Renal/GU Renal disease     Musculoskeletal  (+) Arthritis -, Rheumatoid disorders,    Abdominal   Peds  Hematology  (+) anemia ,   Anesthesia Other Findings   Reproductive/Obstetrics                          Anesthesia Physical Anesthesia Plan  ASA: III  Anesthesia Plan: General   Post-op Pain Management:    Induction: Intravenous  Airway Management Planned: Oral ETT  Additional Equipment: Arterial line  Intra-op Plan:   Post-operative Plan: Extubation in OR  Informed Consent: I have reviewed the patients History and Physical, chart, labs and discussed the procedure including the risks, benefits and alternatives for the proposed anesthesia with the patient or authorized representative who has indicated his/her understanding and acceptance.   Dental advisory given  Plan Discussed with: CRNA and Surgeon  Anesthesia Plan Comments: (Will check CBC , T& C )       Anesthesia Quick Evaluation

## 2013-08-29 LAB — CBC
HCT: 22.3 % — ABNORMAL LOW (ref 36.0–46.0)
Hemoglobin: 7.1 g/dL — ABNORMAL LOW (ref 12.0–15.0)
MCH: 30.3 pg (ref 26.0–34.0)
MCHC: 31.8 g/dL (ref 30.0–36.0)
MCV: 95.3 fL (ref 78.0–100.0)
Platelets: 295 10*3/uL (ref 150–400)
RBC: 2.34 MIL/uL — ABNORMAL LOW (ref 3.87–5.11)
RDW: 18.9 % — ABNORMAL HIGH (ref 11.5–15.5)
WBC: 13.3 10*3/uL — ABNORMAL HIGH (ref 4.0–10.5)

## 2013-08-29 LAB — BASIC METABOLIC PANEL WITH GFR
BUN: 9 mg/dL (ref 6–23)
CO2: 20 meq/L (ref 19–32)
Calcium: 9.3 mg/dL (ref 8.4–10.5)
Chloride: 109 meq/L (ref 96–112)
Creatinine, Ser: 0.78 mg/dL (ref 0.50–1.10)
GFR calc Af Amer: 88 mL/min — ABNORMAL LOW
GFR calc non Af Amer: 76 mL/min — ABNORMAL LOW
Glucose, Bld: 161 mg/dL — ABNORMAL HIGH (ref 70–99)
Potassium: 4.8 meq/L (ref 3.7–5.3)
Sodium: 143 meq/L (ref 137–147)

## 2013-08-29 NOTE — Progress Notes (Signed)
1 Day Post-Op  Subjective: Pt doing well with no c/o this AM.  Objective: Vital signs in last 24 hours: Temp:  [97.3 F (36.3 C)-98.7 F (37.1 C)] 98.2 F (36.8 C) (06/13 0515) Pulse Rate:  [50-109] 103 (06/13 0515) Resp:  [17-27] 18 (06/13 0515) BP: (107-168)/(56-84) 107/73 mmHg (06/13 0515) SpO2:  [100 %] 100 % (06/13 0515) Arterial Line BP: (165-170)/(65-79) 170/79 mmHg (06/12 1445) Last BM Date: 08/28/13  Intake/Output from previous day: 06/12 0701 - 06/13 0700 In: 3792.5 [P.O.:360; I.V.:2932.5; IV Piggyback:500] Out: 800 [Urine:450; Drains:50; Blood:300] Intake/Output this shift:    General appearance: alert and cooperative Resp: clear to auscultation bilaterally Cardio: regular rate and rhythm, S1, S2 normal, no murmur, click, rub or gallop GI: soft, approp ttp, ND, ostomy pink, midline wound c/d/i, hypoactive BS  Lab Results:   Recent Labs  08/28/13 1204 08/28/13 1955 08/29/13 0345  WBC 4.8  --  13.3*  HGB 9.3* 7.3* 7.1*  HCT 28.5* 22.5* 22.3*  PLT 252  --  295   BMET  Recent Labs  08/28/13 0534 08/28/13 1158 08/29/13 0345  NA 142 144 143  K 5.5* 3.9 4.8  CL 105  --  109  CO2 22  --  20  GLUCOSE 86 86 161*  BUN 4*  --  9  CREATININE 0.52  --  0.78  CALCIUM 8.8  --  9.3   PT/INR  Recent Labs  08/27/13 1104  LABPROT 14.9  INR 1.20   Anti-infectives: Anti-infectives   Start     Dose/Rate Route Frequency Ordered Stop   08/27/13 1100  ertapenem (INVANZ) 1 g in sodium chloride 0.9 % 50 mL IVPB     1 g 100 mL/hr over 30 Minutes Intravenous Every 24 hours 08/27/13 1009     08/27/13 1009  ciprofloxacin (CIPRO) IVPB 400 mg  Status:  Discontinued     400 mg 200 mL/hr over 60 Minutes Intravenous Every 12 hours 08/27/13 1010 08/27/13 1027   08/27/13 1009  metroNIDAZOLE (FLAGYL) IVPB 500 mg  Status:  Discontinued     500 mg 100 mL/hr over 60 Minutes Intravenous Every 8 hours 08/27/13 1010 08/27/13 1027      Assessment/Plan: s/p  Procedure(s): COLOSTOMY (N/A) COLON RESECTION SIGMOID (N/A) d/c foley Encourage mobilization Await bowel function Con't abx   LOS: 2 days    Ashley Jacks., Ashley [name] 08/29/2013

## 2013-08-29 NOTE — Progress Notes (Signed)
Pt. Denies urge to use bathroom.  NT had pt. Get up to [facility] and pt. Was unable to urinate.  Bladder scan revealed only 49cc urine.  Encouraged pt to drink fluids and will recheck later.  Will continue to monitor. Syliva Overman

## 2013-08-30 LAB — POCT I-STAT 4, (NA,K, GLUC, HGB,HCT)
Glucose, Bld: 136 mg/dL — ABNORMAL HIGH (ref 70–99)
HEMATOCRIT: 27 % — AB (ref 36.0–46.0)
Hemoglobin: 9.2 g/dL — ABNORMAL LOW (ref 12.0–15.0)
Potassium: 3.8 mEq/L (ref 3.7–5.3)
SODIUM: 143 meq/L (ref 137–147)

## 2013-08-30 MED ORDER — POLYVINYL ALCOHOL 1.4 % OP SOLN
1.0000 [drp] | Freq: Every day | OPHTHALMIC | Status: DC
  Administered 2013-08-31 – 2013-09-03 (×4): 1 [drp] via OPHTHALMIC
  Filled 2013-08-30: qty 15

## 2013-08-30 MED ORDER — APRACLONIDINE HCL 0.5 % OP SOLN
1.0000 [drp] | Freq: Every day | OPHTHALMIC | Status: DC
  Administered 2013-08-31 – 2013-09-03 (×4): 1 [drp] via OPHTHALMIC
  Filled 2013-08-30: qty 5

## 2013-08-30 NOTE — Progress Notes (Signed)
2 Days Post-Op  Subjective: Up in the chair taking some clears.  Says she feels like she had to void, but can't, she has foley in place. Very sore and tender, Drain is serosanguinous fluid. She needs allot of help to move.  Objective: Vital signs in last 24 hours: Temp:  [98.1 F (36.7 C)-99.3 F (37.4 C)] 98.9 F (37.2 C) (06/14 0624) Pulse Rate:  [93-103] 102 (06/14 0624) Resp:  [16-17] 16 (06/14 0624) BP: (105-126)/(53-70) 116/67 mmHg (06/14 0624) SpO2:  [97 %-99 %] 98 % (06/14 0624) Last BM Date: 08/26/13 627 PO recorded 130 from the drain No stool Afebrile,  TM99.3,  No labs today, H/H ws 7.1/22.3 yesterday Intake/Output from previous day: 06/13 0701 - 06/14 0700 In: 2475.8 [P.O.:627; I.V.:1848.8] Out: 1355 [Urine:1225; Drains:130] Intake/Output this shift:    General appearance: alert, cooperative, no distress and up in chair but not really comfortable. Resp: clear to auscultation bilaterally Cardio: regular rate and rhythm, S1, S2 normal, no murmur, click, rub or gallop and she has had a few short runs of SVT, but nothing concerning. GI: no bowel sound, nothing in ostomy bag, dressing dry and intact.  Drain serosanguinous.  Lab Results:   Recent Labs  08/28/13 1204 08/28/13 1955 08/29/13 0345  WBC 4.8  --  13.3*  HGB 9.3* 7.3* 7.1*  HCT 28.5* 22.5* 22.3*  PLT 252  --  295    BMET  Recent Labs  08/28/13 0534 08/28/13 1158 08/29/13 0345  NA 142 144 143  K 5.5* 3.9 4.8  CL 105  --  109  CO2 22  --  20  GLUCOSE 86 86 161*  BUN 4*  --  9  CREATININE 0.52  --  0.78  CALCIUM 8.8  --  9.3   PT/INR  Recent Labs  08/27/13 1104  LABPROT 14.9  INR 1.20     Recent Labs Lab 08/27/13 1104  AST 36  ALT 13  ALKPHOS 62  BILITOT 0.5  PROT 6.8  ALBUMIN 2.7*     Lipase  No results found for this basename: lipase     Studies/Results: No results found.  Medications: . apraclonidine  1 drop Both Eyes BID  . ertapenem  1 g Intravenous Q24H  .  feeding supplement (RESOURCE BREEZE)  1 Container Oral TID PC & HS  . heparin  5,000 Units Subcutaneous 3 times per day  . ketorolac  1 drop Both Eyes BID  . latanoprost  1 drop Both Eyes QHS  . methylPREDNISolone  4 mg Oral Daily  . multivitamin with minerals  1 tablet Oral Daily  . pantoprazole (PROTONIX) IV  40 mg Intravenous QHS  . polyvinyl alcohol  1 drop Both Eyes BID  . sulfaSALAzine  500 mg Oral BID    Assessment/Plan 1. 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 Hx of renal cell carcinoma on the left with nephrectomy  3. 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  4. Rheumatoid arthritis on Medrol and sulfasalazine  5. Atrial fibrillation on chronic anticoagulation (Eliquis stopped on 08/25/13)  6. Hx of Syncope and bradycardia s/p loop recorder placement, 2011 Dr. Cristopher Peru. Episode in 2011  7. Weight loss/protien calorie malnutrition  8. Hx of anemia  9. Hx of rectal bleeding from internal hemorrhoids.05/2012  10. Dyslipidemia  11. Hx of asthma/pneumonia  12. Hx of UTI  13. Hx of monthly headaches  14. Hx of left lobectomy   Plan:  Leave her foley today, Ask OT and PT to see tomorrow.  She is on clears and tolerating , but I will not advance her diet till she has something in the ostomy bag. Recheck labs in AM.  LOS: 3 days    Ashley [name] 08/30/2013

## 2013-08-30 NOTE — Progress Notes (Signed)
Per shift report pt has not IV access, that IV team was paged at 6:15 pm.  Repaged at this time, awaiting call back.

## 2013-08-30 NOTE — Consult Note (Signed)
WOC ostomy consult note Stoma type/location: LLQ Colostomy Stomal assessment/size: 1 and 1/4 inches round, slightly budded, moist, dusky. Peristomal assessment: intact, clear Treatment options for stomal/peristomal skin: None indicated today Output Scant amount of serosanguinous drainage in pouch Ostomy pouching: 2pc., 2 and 1/4 inch pouching system applied. Education provided: Patient's husband, son and daughter in room for initial teaching session. Patient education booklet, 1-page teaching guide for pouch application provided. Basic A&P taught, also stoma and pouch characteristics.  Pouch removal demonstrated, also cleansing of stoma and peristomal skin.  Pouch sizing and preparation demonstrated.  Pouch applied and patient participated in [employer] with assistance.  Husband states that she has limited vision in one eye and is blind in the other.  Taught that pouch changes will be twice weekly and PRN and that emptying is 4-5 times per day.  Demonstration of simulated emptying into toilet for family and cleaning of tail end of ostomy pouch with toilet paper "wicks".  Patient's family will take booklet home tonight and [place] [facility] will provide another with next visit for patient. [place] nursing team will follow, and will remain available to this patient, the nursing, surgical and medical teams.   Thanks, Maudie Flakes, MSN, RN, [place], [place], [place] [phone])

## 2013-08-31 ENCOUNTER — Encounter (HOSPITAL_COMMUNITY): Payer: Self-pay | Admitting: General Surgery

## 2013-08-31 LAB — CBC
HCT: 13.4 % — ABNORMAL LOW (ref 36.0–46.0)
HEMATOCRIT: 25.6 % — AB (ref 36.0–46.0)
HEMOGLOBIN: 8.7 g/dL — AB (ref 12.0–15.0)
Hemoglobin: 4.4 g/dL — CL (ref 12.0–15.0)
MCH: 30.3 pg (ref 26.0–34.0)
MCH: 27.4 pg (ref 26.0–34.0)
MCHC: 32.8 g/dL (ref 30.0–36.0)
MCHC: 34 g/dL (ref 30.0–36.0)
MCV: 92.4 fL (ref 78.0–100.0)
MCV: 80.5 fL (ref 78.0–100.0)
PLATELETS: 237 10*3/uL (ref 150–400)
PLATELETS: 277 10*3/uL (ref 150–400)
RBC: 1.45 MIL/uL — ABNORMAL LOW (ref 3.87–5.11)
RBC: 3.18 MIL/uL — AB (ref 3.87–5.11)
RDW: 19.4 % — ABNORMAL HIGH (ref 11.5–15.5)
RDW: 21.6 % — ABNORMAL HIGH (ref 11.5–15.5)
WBC: 7.8 10*3/uL (ref 4.0–10.5)
WBC: 8.4 10*3/uL (ref 4.0–10.5)

## 2013-08-31 LAB — BASIC METABOLIC PANEL
BUN: 9 mg/dL (ref 6–23)
CALCIUM: 9 mg/dL (ref 8.4–10.5)
CO2: 23 mEq/L (ref 19–32)
Chloride: 108 mEq/L (ref 96–112)
Creatinine, Ser: 0.72 mg/dL (ref 0.50–1.10)
GFR calc Af Amer: >90 mL/min (ref >90)
GFR, EST NON AFRICAN AMERICAN: 78 mL/min — AB (ref >90)
GLUCOSE: 79 mg/dL (ref 70–99)
Potassium: 3.8 mEq/L (ref 3.7–5.3)
Sodium: 143 mEq/L (ref 137–147)

## 2013-08-31 LAB — PREPARE RBC (CROSSMATCH)

## 2013-08-31 LAB — HEMOGLOBIN AND HEMATOCRIT, BLOOD
HCT: 14.6 % — ABNORMAL LOW (ref 36.0–46.0)
HEMOGLOBIN: 4.8 g/dL — AB (ref 12.0–15.0)

## 2013-08-31 LAB — BLOOD PRODUCT ORDER (VERBAL) VERIFICATION

## 2013-08-31 MED ORDER — FAMOTIDINE IN NACL 20-0.9 MG/50ML-% IV SOLN
20.0000 mg | INTRAVENOUS | Status: DC
  Administered 2013-08-31 – 2013-09-02 (×3): 20 mg via INTRAVENOUS
  Filled 2013-08-31 (×3): qty 50

## 2013-08-31 MED ORDER — FUROSEMIDE 10 MG/ML IJ SOLN
INTRAMUSCULAR | Status: AC
  Filled 2013-08-31: qty 4

## 2013-08-31 MED ORDER — FUROSEMIDE 10 MG/ML IJ SOLN
10.0000 mg | Freq: Once | INTRAMUSCULAR | Status: AC
  Administered 2013-08-31: 10 mg via INTRAVENOUS

## 2013-08-31 MED ORDER — DORZOLAMIDE HCL 2 % OP SOLN
1.0000 [drp] | Freq: Two times a day (BID) | OPHTHALMIC | Status: DC
  Administered 2013-08-31 – 2013-09-04 (×8): 1 [drp] via OPHTHALMIC
  Filled 2013-08-31 (×2): qty 10

## 2013-08-31 NOTE — Progress Notes (Addendum)
CRITICAL VALUE ALERT  Critical value received:  HGB: 4.4  Date of notification:  08/31/13  Time of notification:  0802  Critical value read back:yes  Nurse who received alert:  Kathrine Cords, RN  MD notified (1st page):  Modena Jansky, PA  Time of first page:  0804  MD notified (2nd page):  Time of second page:  Responding MD:  Modena Jansky, [place]  Time MD responded:  [phone]

## 2013-08-31 NOTE — Progress Notes (Signed)
I have seen and examined the pt and agree with PA-Jenning's progress note. Pt with no abd pain.  VS normal. Pt not symptomatic from h/h Rechecking stat H/H, I presume lab error, but pt being cross-matched currently Await bowel function.

## 2013-08-31 NOTE — Progress Notes (Signed)
Stat repeat h&h If true, transfuse 2u and repeat hgb. Depending on response may need addl product &/or lasix Hold subcu heparin Maintain type and cross  Leighton Ruff. Redmond Pulling, MD, FACS General, Bariatric, & Minimally Invasive Surgery [facility], [place]

## 2013-08-31 NOTE — Progress Notes (Addendum)
1st unit in and she looks fine, chest clear.  Abdomen is still soft, she has allot of air in her colostomy bag and wants a hamburger. BP 128/61  Pulse 92  Temp(Src) 98.6 F (37 C) (Oral)  Resp 18  Ht 5\' 4"  (1.626 m)  Wt 47.038 kg (103 lb 11.2 oz)  BMI 17.79 kg/m2  SpO2 99% Echo 2011:  EF 60-65%  Plan:  Proceed with 2nd unit  I will get labs again after next unit is in.  1625 hrs.  2nd unit almost in, she has had 1000 ml out but she still sounds a little wet.  I will give some low dose lasix, labs at 1800 and in AM.

## 2013-08-31 NOTE — Progress Notes (Signed)
OT Cancellation Note  Patient Details Name: Ashley [name] MRN: [id number] DOB: Mar 18, 1929   Cancelled Treatment:    Reason Eval/Treat Not Completed: Medical issues which prohibited therapy - Hbg 4.4.  Darlina Rumpf Patterson, OTR/L [phone] 08/31/2013, 10:21 AM

## 2013-08-31 NOTE — Progress Notes (Signed)
PT Cancellation Note  Patient Details Name: KADEISHA BETSCH MRN: [id number] DOB: 08/22/1928   Cancelled Treatment:    Reason Eval/Treat Not Completed: Medical issues which prohibited therapy.  HgB a critical levels.  Will see tomorrow as able. 08/31/2013  Donnella Sham, [place] [phone]  (pager)   Tyller Bowlby, Tessie Fass 08/31/2013, 12:54 PM

## 2013-08-31 NOTE — Progress Notes (Signed)
Agree.   Leighton Ruff. Redmond Pulling, MD, FACS General, Bariatric, & Minimally Invasive Surgery [facility], [place]

## 2013-08-31 NOTE — Progress Notes (Addendum)
3 Days Post-Op  Subjective: Few bowel sounds, nothing in the ostomy bag, some gas.  She is very tired and weak when up.  Incision is dry.  Objective: Vital signs in last 24 hours: Temp:  [98.5 F (36.9 C)-99.5 F (37.5 C)] 98.7 F (37.1 C) (06/15 0547) Pulse Rate:  [92-100] 92 (06/15 0547) Resp:  [16-17] 16 (06/15 0547) BP: (119-122)/(57-63) 122/57 mmHg (06/15 0547) SpO2:  [97 %-99 %] 99 % (06/15 0547) Last BM Date:  (colostomy) 860 PO recorded, 15 ml from the drain TM 99.5 WBC is up 13.3 H/H is still low 7.1/22.3 Intake/Output from previous day: 06/14 0701 - 06/15 0700 In: 1660 [P.O.:860; I.V.:600; IV Piggyback:200] Out: 8101 [Urine:1650; Drains:15] Intake/Output this shift:    General appearance: alert, cooperative and no distress Resp: clear to auscultation bilaterally GI: soft, few BS, some gas in the bag, no stool or fluid. Waffle dressing is dry.  Drain is serous now. BP 117/62  HR 89  RR 18, sats 96-97% on R/A. Lab Results:   Recent Labs  08/29/13 0345 08/31/13 0720  WBC 13.3* 7.8  HGB 7.1* 4.4*  HCT 22.3* 13.4*  PLT 295 237    BMET  Recent Labs  08/29/13 0345 08/31/13 0720  NA 143 143  K 4.8 3.8  CL 109 108  CO2 20 23  GLUCOSE 161* 79  BUN 9 9  CREATININE 0.78 0.72  CALCIUM 9.3 9.0   PT/INR No results found for this basename: LABPROT, INR,  in the last 72 hours   Recent Labs Lab 08/27/13 1104  AST 36  ALT 13  ALKPHOS 62  BILITOT 0.5  PROT 6.8  ALBUMIN 2.7*     Lipase  No results found for this basename: lipase     Studies/Results: No results found.  Medications: . apraclonidine  1 drop Both Eyes QHS  . ertapenem  1 g Intravenous Q24H  . feeding supplement (RESOURCE BREEZE)  1 Container Oral TID PC & HS  . heparin  5,000 Units Subcutaneous 3 times per day  . ketorolac  1 drop Both Eyes BID  . latanoprost  1 drop Both Eyes QHS  . methylPREDNISolone  4 mg Oral Daily  . multivitamin with minerals  1 tablet Oral Daily  .  pantoprazole (PROTONIX) IV  40 mg Intravenous QHS  . polyvinyl alcohol  1 drop Both Eyes QHS  . sulfaSALAzine  500 mg Oral BID    Assessment/Plan 1. Colocutaneous fistula s/p transgluteal drain for diverticulitis with abscess  COLOSTOMY, COLON RESECTION SIGMOID, 08/28/2013, Gwenyth Ober, MD  2. Hx of renal cell carcinoma on the left with nephrectomy  3. 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  4. Rheumatoid arthritis on Medrol and sulfasalazine  5. Atrial fibrillation on chronic anticoagulation (Eliquis stopped on 08/25/13)  6. Hx of Syncope and bradycardia s/p loop recorder placement, 2011 Dr. Cristopher Peru. Episode in 2011  7. Weight loss/protien calorie malnutrition  8. Hx of anemia  9. Hx of rectal bleeding from internal hemorrhoids.05/2012  10. Dyslipidemia  11. Hx of asthma/pneumonia  12. Hx of UTI  13. Hx of monthly headaches  14. Hx of left lobectomy 15.  Acute post op anemia   PLan:   Wound care looked at stoma yesterday and it was moist and slightly dusky.  I am going to repeat H/H stat, and we are also setting her up for 2 units of PRBC's.  She looks better than that H/H would appear.  I will keep her in bed and recheck VS frequently till we figure out what is occuring.  She is having a few Runs of SVT, but not very much.  This will be day 5 of Invanz.  800 urine output last PM.  LOS: 4 days    Bayard More 08/31/2013

## 2013-08-31 NOTE — Clinical Documentation Improvement (Signed)
08/31/13  Dear Dr.Wyatt Rolley Sims   Possible Clinical Conditions?    Acute Blood Loss Anemia on Chronic Anemia Expected Acute Blood Loss Anemia Acute Blood Loss Anemia Acute on chronic blood loss anemia Precipitous drop in Hematocrit Other Condition Cannot Clinically Determine    Risk Factors:  Patient with a history of anemia per 6/11 progress notes.  Diagnostics: H&H on 6/03:  9.9/30.3 H&H on 6/12:  9.3/28.5 H&H on 6/13:  7.1/22.3 H&H on 6/15:  4.4/13.4   Transfusion: 2 units PRBC per 6/15 progress notes.   Thank You, Theron Arista, Clinical Documentation Specialist:  [phone]  [place] Information Management

## 2013-09-01 DIAGNOSIS — D62 Acute posthemorrhagic anemia: Secondary | ICD-10-CM

## 2013-09-01 DIAGNOSIS — E876 Hypokalemia: Secondary | ICD-10-CM

## 2013-09-01 LAB — TYPE AND SCREEN
ABO/RH(D): O POS
ANTIBODY SCREEN: NEGATIVE
UNIT DIVISION: 0
Unit division: 0
Unit division: 0
Unit division: 0

## 2013-09-01 LAB — COMPREHENSIVE METABOLIC PANEL
ALK PHOS: 53 U/L (ref 39–117)
ALT: 7 U/L (ref 0–35)
AST: 23 U/L (ref 0–37)
Albumin: 1.8 g/dL — ABNORMAL LOW (ref 3.5–5.2)
BUN: 8 mg/dL (ref 6–23)
CO2: 24 mEq/L (ref 19–32)
CREATININE: 0.68 mg/dL (ref 0.50–1.10)
Calcium: 8.7 mg/dL (ref 8.4–10.5)
Chloride: 109 mEq/L (ref 96–112)
GFR calc non Af Amer: 79 mL/min — ABNORMAL LOW (ref >90)
GLUCOSE: 73 mg/dL (ref 70–99)
POTASSIUM: 3.6 meq/L — AB (ref 3.7–5.3)
Sodium: 143 mEq/L (ref 137–147)
TOTAL PROTEIN: 4.6 g/dL — AB (ref 6.0–8.3)
Total Bilirubin: 0.5 mg/dL (ref 0.3–1.2)

## 2013-09-01 LAB — CBC
HEMATOCRIT: 23.4 % — AB (ref 36.0–46.0)
HEMOGLOBIN: 8 g/dL — AB (ref 12.0–15.0)
MCH: 27.4 pg (ref 26.0–34.0)
MCHC: 34.2 g/dL (ref 30.0–36.0)
MCV: 80.1 fL (ref 78.0–100.0)
Platelets: 277 10*3/uL (ref 150–400)
RBC: 2.92 MIL/uL — ABNORMAL LOW (ref 3.87–5.11)
RDW: 22.9 % — AB (ref 11.5–15.5)
WBC: 6.8 10*3/uL (ref 4.0–10.5)

## 2013-09-01 LAB — PRO B NATRIURETIC PEPTIDE: PRO B NATRI PEPTIDE: 905.1 pg/mL — AB (ref 0–450)

## 2013-09-01 LAB — MAGNESIUM: MAGNESIUM: 1.4 mg/dL — AB (ref 1.5–2.5)

## 2013-09-01 LAB — OCCULT BLOOD X 1 CARD TO LAB, STOOL: FECAL OCCULT BLD: NEGATIVE

## 2013-09-01 MED ORDER — SODIUM CHLORIDE 0.9 % IV SOLN
INTRAVENOUS | Status: DC
  Administered 2013-09-01 – 2013-09-04 (×5): via INTRAVENOUS
  Filled 2013-09-01 (×9): qty 1000

## 2013-09-01 NOTE — Progress Notes (Signed)
Physical Therapy Treatment Patient Details Name: Ashley [name] MRN: [id number] DOB: 1928-04-21 Today's Date: 09/01/2013    History of Present Illness Admitted with colocutaneous fistula, s/p colectomy with new colostomy.    PT Comments    Pt admitted with/for sign of colocutaneous fistula, s/p colctomy and colostomy.  Pt currently limited functionally due to the problems listed. ( See problems list.)   Pt will benefit from PT to maximize function and safety in order to get ready for next venue listed below.   Follow Up Recommendations  SNF (though pt may choose to go home with HHPT)     Equipment Recommendations       Recommendations for Other Services       Precautions / Restrictions Precautions Precautions: Fall Precaution Comments: poor vision Restrictions Weight Bearing Restrictions: No    Mobility  Bed Mobility Overal bed mobility: Needs Assistance Bed Mobility: Sit to Supine       Sit to supine: Min assist   General bed mobility comments: truncal assist due to pain  Transfers Overall transfer level: Needs assistance Equipment used: Rolling walker (2 wheeled) Transfers: Sit to/from Stand Sit to Stand: Min assist         General transfer comment: min from toilet and chair; cues for hand placement  Ambulation/Gait Ambulation/Gait assistance: Min assist Ambulation Distance (Feet): 100 Feet Assistive device: Rolling walker (2 wheeled) Gait Pattern/deviations: Step-through pattern;Trunk flexed   Gait velocity interpretation: Below normal speed for age/gender General Gait Details: steady, but weak gait, quick to fatigue.   Stairs            Wheelchair Mobility    Modified Rankin (Stroke Patients Only)       Balance Overall balance assessment: Needs assistance Sitting-balance support: No upper extremity supported Sitting balance-Leahy Scale: Good Sitting balance - Comments: can reach outside BOS   Standing balance support: Single  extremity supported;No upper extremity supported Standing balance-Leahy Scale: Fair Standing balance comment: safer holding to support, but can stand statically without assist                    Cognition Arousal/Alertness: Awake/alert Behavior During Therapy: WFL for tasks assessed/performed Overall Cognitive Status: Within Functional Limits for tasks assessed                      Exercises      General Comments        Pertinent Vitals/Pain Some pain at soma site    [place] expects to be discharged to:: Private residence Living Arrangements: Spouse/significant other Available Help at Discharge: Available 24 hours/day Type of Home: House Home Access: Stairs to enter Entrance Stairs-Rails: Right;Left;Can reach both Home Layout: One level Home Equipment: Cane - single point;Shower seat;Grab bars - toilet;Grab bars - tub/shower      Prior Function Level of Independence: Needs assistance  Gait / Transfers Assistance Needed: assist for stairs       PT Goals (current goals can now be found in the care plan section) Acute Rehab PT Goals PT Goal Formulation: With patient Time For Goal Achievement: 09/08/13 Potential to Achieve Goals: Good    Frequency  Min 3X/week    PT Plan      Co-evaluation             End of Session   Activity Tolerance: Patient tolerated treatment well Patient left: in bed;with call bell/phone within reach;with bed alarm set;with family/visitor present     Time:  7106-2694 PT Time Calculation (min): 31 min  Charges:  $Gait Training: 8-22 mins $Therapeutic Activity: 8-22 mins                    G Codes:      Dailah Opperman, Tessie Fass 09/01/2013, 12:56 PM 09/01/2013  Donnella Sham, [place] [phone]  (pager)

## 2013-09-01 NOTE — Progress Notes (Signed)
Ashley Chilcott M. Ashley Dearman, MD, FACS General, Bariatric, & Minimally Invasive Surgery [facility]  

## 2013-09-01 NOTE — Progress Notes (Signed)
Appears ok after transfusion.  Sitting in chair Some burping/belching and abd soreness  nad cta ant b/l Soft, nd, mild TTP. No air in bag Drain -serosang  If hgb stable in am, will restart subcu heparin Replace potassium Pt/ot Full liquids  Leighton Ruff. Redmond Pulling, MD, FACS General, Bariatric, & Minimally Invasive Surgery [facility], [place]

## 2013-09-01 NOTE — Care Management Note (Signed)
  Page 1 of 1   09/01/2013     10:35:46 AM CARE MANAGEMENT NOTE 09/01/2013  Patient:  Ashley [name], Ashley [name]   Account Number:  [id number]  Date Initiated:  09/01/2013  Documentation initiated by:  Magdalen Spatz  Subjective/Objective Assessment:     Action/Plan:   Anticipated DC Date:     Anticipated DC Plan:  [place]         Choice offered to / List presented to:  C-3 Spouse        HH arranged  HH-2 PT      [place].   Status of service:   Medicare Important Message given?   (If response is "NO", the following Medicare IM given date fields will be blank) Date Medicare IM given:   Date Additional Medicare IM given:    Discharge Disposition:  [place]  Per UR Regulation:  Reviewed for med. necessity/level of care/duration of stay  If discussed at [place] of Stay Meetings, dates discussed:   09/01/2013    Comments:  09-01-13 Spoke to patient at bedside and husband Ashley [name] via phone , they are currently receiving home health through [facility] and would like to continue with [facility]. Referral given to [place]. Face sheet information confirmed. Magdalen Spatz RN SBn [phone]

## 2013-09-01 NOTE — Progress Notes (Signed)
4 Days Post-Op  Subjective: Wants to know if she can go home.  Nothing in ostomy bag this AM.    Objective: Vital signs in last 24 hours: Temp:  [97.9 F (36.6 C)-98.9 F (37.2 C)] 97.9 F (36.6 C) (06/16 0504) Pulse Rate:  [76-96] 76 (06/16 0504) Resp:  [16-19] 17 (06/16 0504) BP: (117-137)/(54-84) 130/70 mmHg (06/16 0504) SpO2:  [96 %-100 %] 98 % (06/16 0504) Last BM Date:  (colostomy) 360 po  85 FROM THE DRAIN 2575 URINE OUTPUT AFEBRILE, VSS bnp 905 K+ 3.6 H/H:  8/23 Intake/Output from previous day: 06/15 0701 - 06/16 0700 In: 1780 [P.O.:360; I.V.:750; Blood:670] Out: 2660 [Urine:2575; Drains:85] Intake/Output this shift:    General appearance: alert, cooperative and no distress Resp: rales in both bases, otherwise clear, no respiratory distress. Cardio: regular rate and rhythm, S1, S2 normal, no murmur, click, rub or gallop and I didn't see any SVT on review, but I only saw 6/15. GI: soft, non-tender; bowel sounds normal; no masses,  no organomegaly and incision looks fine. she is sore, nothing in ostomy bag.  drain is serous fluid  Lab Results:   Recent Labs  08/31/13 1804 09/01/13 0515  WBC 8.4 6.8  HGB 8.7* 8.0*  HCT 25.6* 23.4*  PLT 277 277    BMET  Recent Labs  08/31/13 0720 09/01/13 0515  NA 143 143  K 3.8 3.6*  CL 108 109  CO2 23 24  GLUCOSE 79 73  BUN 9 8  CREATININE 0.72 0.68  CALCIUM 9.0 8.7   PT/INR No results found for this basename: LABPROT, INR,  in the last 72 hours   Recent Labs Lab 08/27/13 1104 09/01/13 0515  AST 36 23  ALT 13 7  ALKPHOS 62 53  BILITOT 0.5 0.5  PROT 6.8 4.6*  ALBUMIN 2.7* 1.8*     Lipase  No results found for this basename: lipase     Studies/Results: No results found.  Medications: . apraclonidine  1 drop Both Eyes QHS  . dorzolamide  1 drop Both Eyes BID  . ertapenem  1 g Intravenous Q24H  . famotidine (PEPCID) IV  20 mg Intravenous Q24H  . feeding supplement (RESOURCE BREEZE)  1  Container Oral TID PC & HS  . ketorolac  1 drop Both Eyes BID  . latanoprost  1 drop Both Eyes QHS  . methylPREDNISolone  4 mg Oral Daily  . multivitamin with minerals  1 tablet Oral Daily  . pantoprazole (PROTONIX) IV  40 mg Intravenous QHS  . polyvinyl alcohol  1 drop Both Eyes QHS  . sulfaSALAzine  500 mg Oral BID   . sodium chloride 75 mL/hr at 08/31/13 2207  . lactated ringers 10 mL/hr at 08/28/13 1140    Assessment/Plan 1. Colocutaneous fistula s/p transgluteal drain for diverticulitis with abscess  COLOSTOMY, COLON RESECTION SIGMOID, 08/28/2013, Gwenyth Ober, MD  2. Hx of renal cell carcinoma on the left with nephrectomy  3. 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  4. Rheumatoid arthritis on Medrol and sulfasalazine  5. Atrial fibrillation on chronic anticoagulation (Eliquis stopped on 08/25/13)  6. Hx of Syncope and bradycardia s/p loop recorder placement, 2011 Dr. Cristopher [place]. Episode in 2011  7. Weight loss/protien calorie malnutrition  8. Hx of anemia  9. Hx of rectal bleeding from internal hemorrhoids.05/2012  10. Dyslipidemia  11. Hx of asthma/pneumonia  12. Hx of UTI  13. Hx of monthly headaches  14. Hx of left  lobectomy  15. Multifactorial post op anemia (transfused 2 units PRBC 08/31/13) 16.  Hypokalemia   Plan:  Continue clears, hopefully we can mobilize her some,  She isn't doing IS, so I told her to do it hourly again.  I will change her IV and add some KCL to replace K+.  Recheck labs in AM. Off heparin and SCD's applied.  I gave her some lasix last PM, watch her pulmonary status, I think she will get rid of any extra fluid without decreasing IV rate, or additional lasix.  I have ordered occult for stool, but i doubt we will see anything.  I will ask case manager to see and start setting up home care arrangements.      LOS: 5 days    JENNINGS,WILLARD 09/01/2013

## 2013-09-01 NOTE — Consult Note (Addendum)
WOC ostomy follow up Stoma type/location: LLQ Colostomy Stomal assessment/size: 1 and 1/4 inches round, slightly budded, moist Peristomal assessment: intact, clear Treatment options for stomal/peristomal skin:skin barrier ring used to provide a little soft convexity beneath skin barrier Output small amount of dark brown stool/effluent Ostomy pouching: 2pc. 2 and 1/4 inches and skin barrier ring Education provided: Patient is alone for today's visit and is not receptive to ostomy teaching. Demonstrated on Sunday that she could perform Lock and Roll closure with cueing, but does not want to today. Enrolled patient in [place] Start Discharge program: Yes [place] nursing team will follow, and will remain available to this patient, the nursing, surgical and medical teams.  I have some concern that she will not be independent for several weeks with her stoma (if at all) and suggest evaluation for skilled care/Rehab vs Home with [facility] support. If you agree, please order consult with Case Management/Social Work. Thanks, Maudie Flakes, MSN, RN, [place], [place], [place] [phone])

## 2013-09-01 NOTE — Evaluation (Signed)
Occupational Therapy Evaluation Patient Details Name: Ashley [name] MRN: [id number] DOB: [birth year] Today's Date: 09/01/2013    History of Present Illness Admitted with colocutaneous fistula, s/p colectomy with new colostomy.   Clinical Impression   Pt admitted with above. She demonstrates the below listed deficits and will benefit from continued OT to maximize safety and independence with BADLs.  She presents to OT with impaired vision that has been long standing and a h/o of falls.  She is deconditioned with pain 3-4/10 in abdomen which limits her ability to perform self care activities independently.  Currently, she requires mod A for LB ADLs.  Spoke with pt and spouse.  Pt would like to discharge home.  Spouse and son are open to the option of SNF, but are hopeful that they can activate pt's long term care insurance to provide additional assistance at home.  If they are able to do this, recommend HHOT, otherwise, she will likely need SNF.     Follow Up Recommendations  SNF;Supervision/Assistance - 24 hour - Family would prefer to activate Long term care policy if they are able and discharge home with 24 hour assistance.    Equipment Recommendations  None recommended by OT    Recommendations for Other Services       Precautions / Restrictions Precautions Precautions: Fall Precaution Comments: Blind Lt. eye and impaired acuity Rt eye      Mobility Bed Mobility Overal bed mobility: Needs Assistance Bed Mobility: Rolling;Sidelying to Sit Rolling: Min assist Sidelying to sit: Min assist       General bed mobility comments: assist to roll trunk and move into seated position  Transfers Overall transfer level: Needs assistance Equipment used: Rolling walker (2 wheeled) Transfers: Sit to/from [facility] Sit to Stand: Min assist Stand pivot transfers: Min assist       General transfer comment: Min a for balance and safety    Balance Overall balance  assessment: Needs assistance Sitting-balance support: Feet supported;No upper extremity supported Sitting balance-Leahy Scale: Good     Standing balance support: Single extremity supported Standing balance-Leahy Scale: Fair                              ADL Overall ADL's : Needs assistance/impaired Eating/Feeding: Independent;Sitting   Grooming: Wash/dry hands;Standing;Wash/dry face;Minimal assistance   Upper Body Bathing: Supervision/ safety;Sitting   Lower Body Bathing: Moderate assistance;Sit to/from stand   Upper Body Dressing : Supervision/safety;Sitting   Lower Body Dressing: Maximal assistance;Sit to/from stand Lower Body Dressing Details (indicate cue type and reason): limited by pain Toilet Transfer: Minimal assistance;Ambulation;Comfort height toilet   Toileting- Clothing Manipulation and Hygiene: Minimal assistance;Sit to/from stand       Functional mobility during ADLs: Minimal assistance;Rolling walker General ADL Comments: Pt able to doff socks, but unable to don due to pain.  May benefit from AE.  Pt fatigues quickly      Vision                 Additional Comments: Pt with blindness OS; and impaired acuity OD.  She was noted to run into items on Lt side frequently    Perception     Praxis      Pertinent Vitals/Pain 3-4/10 in abdomen.  Pt repositioned.      Hand Dominance     Extremity/Trunk Assessment Upper Extremity Assessment Upper Extremity Assessment: Generalized weakness   Lower Extremity Assessment Lower Extremity Assessment: Defer to  PT evaluation       Communication Communication Communication: No difficulties   Cognition Arousal/Alertness: Awake/alert Behavior During Therapy: WFL for tasks assessed/performed Overall Cognitive Status: Within Functional Limits for tasks assessed                     General Comments       Exercises       Shoulder Instructions      Home Living Family/patient expects  to be discharged to:: Private residence Living Arrangements: Spouse/significant other Available Help at Discharge: Available 24 hours/day Type of Home: House Home Access: Stairs to enter [employer] of Steps: 6 Entrance Stairs-Rails: Right;Left;Can reach both Home Layout: One level     Bathroom Shower/Tub: Teacher, early years/pre: [place] - single point;Grab bars - toilet;Grab bars - tub/shower          Prior Functioning/Environment Level of Independence: Needs assistance  Gait / Transfers Assistance Needed: assist for stairs ADL's / Homemaking Assistance Needed: spouse assists with IADLs        OT Diagnosis: Generalized weakness;Acute pain   OT Problem List: Decreased strength;Impaired balance (sitting and/or standing);Decreased knowledge of use of DME or AE;Decreased knowledge of precautions;Pain;Impaired vision/perception   OT Treatment/Interventions: Self-care/ADL training;DME and/or AE instruction;Therapeutic activities;Patient/family education;Balance training    OT Goals(Current goals can be found in the care plan section) Acute Rehab OT Goals Patient Stated Goal: to go home OT Goal Formulation: With patient/family Time For Goal Achievement: 09/15/13 Potential to Achieve Goals: Good ADL Goals Pt Will Perform Grooming: with supervision;standing Pt Will Perform Lower Body Bathing: with supervision;with adaptive equipment;sit to/from stand Pt Will Perform Lower Body Dressing: with supervision;with adaptive equipment;sit to/from stand Pt Will Transfer to Toilet: with supervision;ambulating;regular height toilet;bedside commode Pt Will Perform Toileting - Clothing Manipulation and hygiene: with supervision;sit to/from stand Pt Will Perform Tub/Shower Transfer: Tub transfer;with min assist;ambulating;shower seat  OT Frequency: Min 2X/week   Barriers to D/C: Decreased caregiver support  Pt may be able to activate long  term care policy to allow increased assist at home       Co-evaluation              End of Session Equipment Utilized During Treatment: Rolling walker Nurse Communication: Mobility status  Activity Tolerance: Patient limited by fatigue Patient left: in chair;with call bell/phone within reach;with family/visitor present   Time: 4782-9562 OT Time Calculation (min): 26 min Charges:  OT General Charges $OT Visit: 1 Procedure OT Evaluation $Initial OT Evaluation Tier I: 1 Procedure OT Treatments $Self Care/Home Management : 8-22 mins G-Codes:    Conarpe, Wendi M 09-23-13, 5:20 PM

## 2013-09-02 LAB — BASIC METABOLIC PANEL
BUN: 7 mg/dL (ref 6–23)
CHLORIDE: 112 meq/L (ref 96–112)
CO2: 22 meq/L (ref 19–32)
CREATININE: 0.57 mg/dL (ref 0.50–1.10)
Calcium: 8.7 mg/dL (ref 8.4–10.5)
GFR calc Af Amer: >90 mL/min (ref >90)
GFR calc non Af Amer: 84 mL/min — ABNORMAL LOW (ref >90)
Glucose, Bld: 71 mg/dL (ref 70–99)
POTASSIUM: 4.1 meq/L (ref 3.7–5.3)
SODIUM: 144 meq/L (ref 137–147)

## 2013-09-02 LAB — CBC
HCT: 23.3 % — ABNORMAL LOW (ref 36.0–46.0)
Hemoglobin: 7.9 g/dL — ABNORMAL LOW (ref 12.0–15.0)
MCH: 27.6 pg (ref 26.0–34.0)
MCHC: 33.9 g/dL (ref 30.0–36.0)
MCV: 81.5 fL (ref 78.0–100.0)
Platelets: 289 K/uL (ref 150–400)
RBC: 2.86 MIL/uL — ABNORMAL LOW (ref 3.87–5.11)
RDW: 22.9 % — ABNORMAL HIGH (ref 11.5–15.5)
WBC: 6.4 K/uL (ref 4.0–10.5)

## 2013-09-02 MED ORDER — MAGNESIUM OXIDE 400 (241.3 MG) MG PO TABS
400.0000 mg | ORAL_TABLET | Freq: Two times a day (BID) | ORAL | Status: DC
  Administered 2013-09-02 – 2013-09-04 (×5): 400 mg via ORAL
  Filled 2013-09-02 (×6): qty 1

## 2013-09-02 MED ORDER — ENSURE COMPLETE PO LIQD
237.0000 mL | Freq: Two times a day (BID) | ORAL | Status: DC
Start: 2013-09-02 — End: 2013-09-04
  Administered 2013-09-03 – 2013-09-04 (×3): 237 mL via ORAL

## 2013-09-02 MED ORDER — FAMOTIDINE 20 MG PO TABS
20.0000 mg | ORAL_TABLET | Freq: Every day | ORAL | Status: DC
  Administered 2013-09-03 – 2013-09-04 (×2): 20 mg via ORAL
  Filled 2013-09-02 (×2): qty 1

## 2013-09-02 MED ORDER — PANTOPRAZOLE SODIUM 40 MG PO TBEC
40.0000 mg | DELAYED_RELEASE_TABLET | Freq: Every day | ORAL | Status: DC
  Administered 2013-09-02 – 2013-09-04 (×3): 40 mg via ORAL
  Filled 2013-09-02 (×3): qty 1

## 2013-09-02 MED ORDER — HEPARIN SODIUM (PORCINE) 5000 UNIT/ML IJ SOLN
5000.0000 [IU] | Freq: Three times a day (TID) | INTRAMUSCULAR | Status: DC
  Administered 2013-09-02 – 2013-09-04 (×7): 5000 [IU] via SUBCUTANEOUS
  Filled 2013-09-02 (×9): qty 1

## 2013-09-02 NOTE — Progress Notes (Signed)
Reviewed dietetic intern note. Agree with intervention. Pryor Ochoa RD, LDN Inpatient Clinical Dietitian Pager: [phone] After Hours Pager: [phone]

## 2013-09-02 NOTE — Progress Notes (Signed)
5 Days Post-Op  Subjective: Fell last night. Denies cp/sob. No n/v. Some soreness in abd.   Objective: Vital signs in last 24 hours: Temp:  [97.6 F (36.4 C)-98.8 F (37.1 C)] 97.6 F (36.4 C) (06/17 0904) Pulse Rate:  [70-91] 82 (06/17 0904) Resp:  [16-18] 16 (06/17 0904) BP: (119-149)/(59-80) 126/63 mmHg (06/17 0904) SpO2:  [95 %-100 %] 98 % (06/17 0904) Last BM Date: 09/01/13  Intake/Output from previous day: 06/16 0701 - 06/17 0700 In: 1740 [P.O.:480; I.V.:1060; IV Piggyback:200] Out: 3315 [Urine:2800; Drains:165; Stool:350] Intake/Output this shift: Total I/O In: -  Out: 500 [Urine:500]  Alert, appropriate cta b/l Reg Soft, nd, ostomy viable with stool in bag. Incision c/d/i. Drain - serosang No edema  Lab Results:   Recent Labs  09/01/13 0515 09/02/13 0630  WBC 6.8 6.4  HGB 8.0* 7.9*  HCT 23.4* 23.3*  PLT 277 289   BMET  Recent Labs  09/01/13 0515 09/02/13 0630  NA 143 144  K 3.6* 4.1  CL 109 112  CO2 24 22  GLUCOSE 73 71  BUN 8 7  CREATININE 0.68 0.57  CALCIUM 8.7 8.7   PT/INR No results found for this basename: LABPROT, INR,  in the last 72 hours ABG No results found for this basename: PHART, PCO2, PO2, HCO3,  in the last 72 hours  Studies/Results: No results found.  Anti-infectives: Anti-infectives   Start     Dose/Rate Route Frequency Ordered Stop   08/27/13 1100  ertapenem (INVANZ) 1 g in sodium chloride 0.9 % 50 mL IVPB     1 g 100 mL/hr over 30 Minutes Intravenous Every 24 hours 08/27/13 1009     08/27/13 1009  ciprofloxacin (CIPRO) IVPB 400 mg  Status:  Discontinued     400 mg 200 mL/hr over 60 Minutes Intravenous Every 12 hours 08/27/13 1010 08/27/13 1027   08/27/13 1009  metroNIDAZOLE (FLAGYL) IVPB 500 mg  Status:  Discontinued     500 mg 100 mL/hr over 60 Minutes Intravenous Every 8 hours 08/27/13 1010 08/27/13 1027      Assessment/Plan: s/p Procedure(s): COLOSTOMY (N/A) COLON RESECTION SIGMOID (N/A)  1.  Colocutaneous fistula s/p transgluteal drain for diverticulitis with abscess  COLOSTOMY, COLON RESECTION SIGMOID, 08/28/2013, Gwenyth Ober, MD  2. Hx of renal cell carcinoma on the left with nephrectomy  3. 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  4. Rheumatoid arthritis on Medrol and sulfasalazine  5. Atrial fibrillation on chronic anticoagulation (Eliquis stopped on 08/25/13)  6. Hx of Syncope and bradycardia s/p loop recorder placement, 2011 Dr. Cristopher [place]. Episode in 2011  7. Weight loss/protien calorie malnutrition  8. Hx of anemia  9. Hx of rectal bleeding from internal hemorrhoids.05/2012  10. Dyslipidemia  11. Hx of asthma/pneumonia  12. Hx of UTI  13. Hx of monthly headaches  14. Hx of left lobectomy  15. Acute blood loss anemia on chronic anemia (transfused 2 units PRBC 08/31/13)   hgb stable now. Will restart subcu heparin. Repeat cbc in am Will need to look into whether or not pt needs still be on long term anticoagulation for her chronic afib Adv diet to solids Cont abx for total of 1 week given active diverticulitis at time of surgery Pt getting close for placement  Leighton Ruff. Redmond Pulling, MD, FACS General, Bariatric, & Minimally Invasive Surgery [facility], [place]    LOS: 6 days    Gayland Curry 09/02/2013

## 2013-09-02 NOTE — Progress Notes (Addendum)
Heard a loud noise, noted pt lying on the floor beside her bed. Pt stated" I don`t want to bother anybody so I tried to get up by myself to go to BR and fell" Neuro checks WNL. VSS. Pt denies loss of consciousness. MD notified Dr Molli Posey with no new orders. Spouse notified via phone call. Pt educated to use call light for assistance.

## 2013-09-02 NOTE — Progress Notes (Signed)
NUTRITION FOLLOW UP  Intervention:    Add Ensure Complete po BID, each supplement providing 350 kcals and 13 grams of protein   Discontinue Resource Breeze TID  RD to continue to follow   Nutrition Dx:   Unintended weight loss related to poor appetite, poor PO intake as evidenced by weight loss of 13.4% x 3 months; ongoing  Goal:   Pt to meet >/=90% of estimated nutrition needs; progressing  Monitor:   PO intake, supplement acceptance, weight trend, labs   Assessment:   80 year old female scheduled for sigmoid colectomy and closure of colo-cutaneous fistula 6/12 by Dr. Hulen Skains. She was originally admitted on 08/13/13 with acute sigmoid diverticulitis with a large abscess. This was treated with a percutaneous gluteal drain and antibiotics. She was discharged home on 08/19/13 and upon follow-up has weakness, decreased appetite, weight loss and a colo-cutaneous fistula.  Pt s/p the following on 6/12:  COLOSTOMY  COLON RESECTION SIGMOID  Pt advanced to soft diet 6/17. Pt reports eating breakfast this morning but having some nausea/abdominal pain afterwards. Pt stated that she could only eat a little bit. Pt likes the Resource Breeze supplement but stated that she would prefer to drink Ensure. Pt continues to have no appetite.   Low Magnesium (1.4) Potassium was low, but now WNL   Height: Ht Readings from Last 1 Encounters:  08/27/13 5\' 4"  (1.626 m)    Weight Status:   Wt Readings from Last 1 Encounters:  08/27/13 103 lb 11.2 oz (47.038 kg)    Re-estimated needs:  Kcal: 1300 - 1500  Protein: >/= 70 grams  Fluid: >/= 1.5 L/day   Skin: facial edema, surgical abdomen incision   Diet Order: Criss Rosales   Intake/Output Summary (Last 24 hours) at 09/02/13 1201 Last data filed at 09/02/13 1015  Gross per 24 hour  Intake   1740 ml  Output   3065 ml  Net  -1325 ml    Last BM: 6/16    Labs:   Recent Labs Lab 08/27/13 1935  08/31/13 0720 09/01/13 0515 09/01/13 0700  09/02/13 0630  NA  --   < > 143 143  --  144  K  --   < > 3.8 3.6*  --  4.1  CL  --   < > 108 109  --  112  CO2  --   < > 23 24  --  22  BUN  --   < > 9 8  --  7  CREATININE  --   < > 0.72 0.68  --  0.57  CALCIUM  --   < > 9.0 8.7  --  8.7  MG 1.5  --   --   --  1.4*  --   GLUCOSE  --   < > 79 73  --  71  < > = values in this interval not displayed.  CBG (last 3)  No results found for this basename: GLUCAP,  in the last 72 hours  Scheduled Meds: . apraclonidine  1 drop Both Eyes QHS  . dorzolamide  1 drop Both Eyes BID  . ertapenem  1 g Intravenous Q24H  . famotidine  20 mg Oral Daily  . feeding supplement (RESOURCE BREEZE)  1 Container Oral TID PC & HS  . heparin subcutaneous  5,000 Units Subcutaneous 3 times per day  . ketorolac  1 drop Both Eyes BID  . latanoprost  1 drop Both Eyes QHS  . magnesium oxide  400 mg Oral BID  . methylPREDNISolone  4 mg Oral Daily  . multivitamin with minerals  1 tablet Oral Daily  . pantoprazole  40 mg Oral Q1200  . polyvinyl alcohol  1 drop Both Eyes QHS  . sulfaSALAzine  500 mg Oral BID    Continuous Infusions: . lactated ringers 10 mL/hr at 08/28/13 1140  . 0.9 % sodium chloride with kcl 75 mL/hr at 09/01/13 2147    Carrolyn Leigh, BS Dietetic Intern Pager: [phone]

## 2013-09-03 DIAGNOSIS — I471 Supraventricular tachycardia: Secondary | ICD-10-CM

## 2013-09-03 DIAGNOSIS — Z9229 Personal history of other drug therapy: Secondary | ICD-10-CM

## 2013-09-03 DIAGNOSIS — R55 Syncope and collapse: Secondary | ICD-10-CM

## 2013-09-03 LAB — CBC
HEMATOCRIT: 25.2 % — AB (ref 36.0–46.0)
Hemoglobin: 8.4 g/dL — ABNORMAL LOW (ref 12.0–15.0)
MCH: 27.9 pg (ref 26.0–34.0)
MCHC: 33.3 g/dL (ref 30.0–36.0)
MCV: 83.7 fL (ref 78.0–100.0)
PLATELETS: 284 10*3/uL (ref 150–400)
RBC: 3.01 MIL/uL — AB (ref 3.87–5.11)
RDW: 23.1 % — ABNORMAL HIGH (ref 11.5–15.5)
WBC: 7.2 10*3/uL (ref 4.0–10.5)

## 2013-09-03 LAB — BASIC METABOLIC PANEL
BUN: 7 mg/dL (ref 6–23)
CALCIUM: 8.8 mg/dL (ref 8.4–10.5)
CHLORIDE: 110 meq/L (ref 96–112)
CO2: 22 meq/L (ref 19–32)
Creatinine, Ser: 0.65 mg/dL (ref 0.50–1.10)
GFR calc Af Amer: >90 mL/min (ref >90)
GFR calc non Af Amer: 80 mL/min — ABNORMAL LOW (ref >90)
GLUCOSE: 71 mg/dL (ref 70–99)
POTASSIUM: 4.1 meq/L (ref 3.7–5.3)
Sodium: 144 mEq/L (ref 137–147)

## 2013-09-03 LAB — MAGNESIUM: MAGNESIUM: 1.5 mg/dL (ref 1.5–2.5)

## 2013-09-03 NOTE — Clinical Social Work Placement (Addendum)
Clinical Social Work Department CLINICAL SOCIAL WORK PLACEMENT NOTE 09/03/2013  Patient:  Ashley [name], Ashley [name]  Account Number:  [id number] Admit date:  08/27/2013  Clinical Social Worker:  Delrae Sawyers  Date/time:  09/03/2013 12:03 PM  Clinical Social Work is seeking post-discharge placement for this patient at the following level of care:   [place]   (*CSW will update this form in Epic as items are completed)   09/03/2013  Patient/family provided with [place] Department of Clinical Social Work's list of facilities offering this level of care within the geographic area requested by the patient (or if unable, by the patient's family).  09/03/2013  Patient/family informed of their freedom to choose among providers that offer the needed level of care, that participate in Medicare, Medicaid or managed care program needed by the patient, have an available bed and are willing to accept the patient.  09/03/2013  Patient/family informed of [facility]' ownership interest in [facility], as well as of the fact that they are under no obligation to receive care at this facility.  PASARR submitted to EDS on 09/03/2013 PASARR number received on 09/03/2013  FL2 transmitted to all facilities in geographic area requested by pt/family on  09/03/2013 FL2 transmitted to all facilities within larger geographic area on   Patient informed that his/her managed care company has contracts with or will negotiate with  certain facilities, including the following:     Patient/family informed of bed offers received:  09/04/2013 Patient chooses bed at [facility] Physician recommends and patient chooses bed at    Patient to be transferred to  [place] on  09/04/2013 Patient to be transferred to facility by [place] Patient and family notified of transfer on 09/04/2013 Name of family member notified:  Pt and pt's husband, Taylia Berber  The following  physician request were entered in Epic:   Additional Comments:  [name], MSW, [facility] Licensed Clinical Social Worker [phone] and [phone]

## 2013-09-03 NOTE — Progress Notes (Signed)
Physical Therapy Treatment Patient Details Name: Ashley [name] MRN: [id number] DOB: 1928-12-12 Today's Date: 09/03/2013    History of Present Illness Admitted with colocutaneous fistula, s/p colectomy with new colostomy.    PT Comments    Pt continues to require min (A) with mobility and gt. Pt unsteady and fatigues quickly with mobility. Cont to recommend SNF for post acute rehab. Will cont to follow per POC.  Follow Up Recommendations  SNF (pt reluctant to D/C to SNF)     Equipment Recommendations  Rolling walker with 5" wheels    Recommendations for Other Services       Precautions / Restrictions Precautions Precautions: Fall Precaution Comments: Blind Lt. eye and impaired acuity Rt eye Restrictions Weight Bearing Restrictions: No    Mobility  Bed Mobility Overal bed mobility: Needs Assistance Bed Mobility: Rolling;Sidelying to Sit Rolling: Min assist Sidelying to sit: Min assist       General bed mobility comments: handheld (A) to elevate trunk to sitting position due to overall deconditioning/weakness; cues for hand placement and sequencing   Transfers Overall transfer level: Needs assistance Equipment used: Rolling walker (2 wheeled) Transfers: Sit to/from Stand Sit to Stand: Min assist Stand pivot transfers: Min assist       General transfer comment: initially performed sit to stand to [facility] due to urgency; min (A) to achieve standing position; pt very unsteady and demo generalized weakness in LEs  Ambulation/Gait Ambulation/Gait assistance: Min assist Ambulation Distance (Feet): 80 Feet Assistive device: Rolling walker (2 wheeled) Gait Pattern/deviations: Step-through pattern;Decreased stride length;Narrow base of support;Trunk flexed Gait velocity: decreased Gait velocity interpretation: Below normal speed for age/gender General Gait Details: pt with difficulty managing RW around obstacles; pt fatigued quickly and required (A) to maintain balance and  manage    Stairs            Wheelchair Mobility    Modified Rankin (Stroke Patients Only)       Balance Overall balance assessment: Needs assistance;History of Falls Sitting-balance support: Feet supported;No upper extremity supported Sitting balance-Leahy Scale: Good   Postural control: Posterior lean Standing balance support: During functional activity;Bilateral upper extremity supported Standing balance-Leahy Scale: Poor Standing balance comment: required (A) to maintain balance; pt with posterior lean                    Cognition Arousal/Alertness: Awake/alert Behavior During Therapy: WFL for tasks assessed/performed Overall Cognitive Status: Within Functional Limits for tasks assessed                      Exercises General Exercises - Lower Extremity Ankle Circles/Pumps: AROM;Strengthening;Both;10 reps;Seated Long Arc Quad: AROM;Strengthening;Both;10 reps;Seated Heel Slides: AROM;Both;Strengthening;10 reps;Seated Hip Flexion/Marching: AROM;Strengthening;Both;10 reps;Seated    General Comments        Pertinent Vitals/Pain Denies any pain at this time     Home Living                      Prior Function            PT Goals (current goals can now be found in the care plan section) Acute Rehab PT Goals Patient Stated Goal: i dont know if i want to go to rehab or go home PT Goal Formulation: With patient Time For Goal Achievement: 09/08/13 Potential to Achieve Goals: Good Progress towards PT goals: Progressing toward goals    Frequency  Min 3X/week    PT Plan Current plan remains appropriate  Co-evaluation             End of Session Equipment Utilized During Treatment: Gait belt Activity Tolerance: Patient tolerated treatment well Patient left: in chair;with call bell/phone within reach     Time: 1124-1149 PT Time Calculation (min): 25 min  Charges:  $Gait Training: 8-22 mins $Therapeutic Exercise: 8-22  mins                    G CodesGustavus Bryant, [place] 09/03/2013, 12:46 PM

## 2013-09-03 NOTE — Progress Notes (Signed)
[facility] Progress Note  6 Days Post-Op  Subjective: Feels well.  Wants to go home today "not to snf".  Tolerating solid diet.  Ostomy functioning.  No N/V.  Ambulating some.  Took a fall 2 days ago, says she normally walks with a cane.    Objective: Vital signs in last 24 hours: Temp:  [97.5 F (36.4 C)-99.2 F (37.3 C)] 99.2 F (37.3 C) (06/18 0540) Pulse Rate:  [71-88] 79 (06/18 0540) Resp:  [16-18] 16 (06/18 0540) BP: (124-154)/(55-74) 124/55 mmHg (06/18 0540) SpO2:  [95 %-100 %] 97 % (06/18 0540) Last BM Date: 09/01/13  Intake/Output from previous day: 06/17 0701 - 06/18 0700 In: 2235 [P.O.:360; I.V.:1875] Out: 2740 [Urine:2350; Drains:90; Stool:300] Intake/Output this shift:    PE: Gen:  Alert, NAD, pleasant Abd: Soft, NT/ND, +BS, no HSM, incisions intact, with some serous drainage from lower midline wound, drain with sanguinous drainage (68mL/24hr)   Lab Results:   Recent Labs  09/02/13 0630 09/03/13 0510  WBC 6.4 7.2  HGB 7.9* 8.4*  HCT 23.3* 25.2*  PLT 289 284   BMET  Recent Labs  09/02/13 0630 09/03/13 0510  NA 144 144  K 4.1 4.1  CL 112 110  CO2 22 22  GLUCOSE 71 71  BUN 7 7  CREATININE 0.57 0.65  CALCIUM 8.7 8.8   PT/INR No results found for this basename: LABPROT, INR,  in the last 72 hours CMP     Component Value Date/Time   NA 144 09/03/2013 0510   K 4.1 09/03/2013 0510   CL 110 09/03/2013 0510   CO2 22 09/03/2013 0510   GLUCOSE 71 09/03/2013 0510   BUN 7 09/03/2013 0510   CREATININE 0.65 09/03/2013 0510   CALCIUM 8.8 09/03/2013 0510   PROT 4.6* 09/01/2013 0515   ALBUMIN 1.8* 09/01/2013 0515   AST 23 09/01/2013 0515   ALT 7 09/01/2013 0515   ALKPHOS 53 09/01/2013 0515   BILITOT 0.5 09/01/2013 0515   GFRNONAA 80* 09/03/2013 0510   GFRAA >90 09/03/2013 0510   Lipase  No results found for this basename: lipase       Studies/Results: No results found.  Anti-infectives: Anti-infectives   Start     Dose/Rate Route  Frequency Ordered Stop   08/27/13 1100  ertapenem (INVANZ) 1 g in sodium chloride 0.9 % 50 mL IVPB     1 g 100 mL/hr over 30 Minutes Intravenous Every 24 hours 08/27/13 1009     08/27/13 1009  ciprofloxacin (CIPRO) IVPB 400 mg  Status:  Discontinued     400 mg 200 mL/hr over 60 Minutes Intravenous Every 12 hours 08/27/13 1010 08/27/13 1027   08/27/13 1009  metroNIDAZOLE (FLAGYL) IVPB 500 mg  Status:  Discontinued     500 mg 100 mL/hr over 60 Minutes Intravenous Every 8 hours 08/27/13 1010 08/27/13 1027       Assessment/Plan 1. Colocutaneous fistula s/p transgluteal drain for diverticulitis with abscess  COLOSTOMY, COLON RESECTION SIGMOID, 08/28/2013, Ashley Ober, MD  2. Hx of renal cell carcinoma on the left with nephrectomy  3. 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  4. Rheumatoid arthritis on Medrol and sulfasalazine  5. Atrial fibrillation on chronic anticoagulation (Eliquis stopped on 08/25/13)  6. Hx of Syncope and bradycardia s/p loop recorder placement, 2011 Ashley [name]. Episode in 2011  7. Weight loss/protien calorie malnutrition  8. Hx of anemia  9. Hx of rectal bleeding from internal hemorrhoids.05/2012  10. Dyslipidemia  11. Hx of asthma/pneumonia  12. Hx of UTI  13. Hx of monthly headaches  14. Hx of left lobectomy  15. Acute blood loss anemia on chronic anemia (transfused 2 units PRBC 08/31/13)   Plan: -Hgb stable now after 2 pRBC's, SQ heparin resumed yesterday, Hgb stable this am at 8.4 -Cardiology consult for their recommendations of resuming Eliquis for AFIB in light of frequent falls (fell while in the hospital) and age.  If cardiology decides to resume Eliquis we would be okay resuming today if needed -On low residue diet, ostomy working well -Discontinue IV Invanz today 8/8 day treatment for active diverticulitis at time of surgery  -Will see what PT says today about placement, but the patient wants to go home with [facility] services and  is currently refusing SNF -D/c JP drain today -Pack/dressing change to lower midline wound as it is draining and causing her skin folds to be macerated   LOS: 7 days    Ashley [name] 09/03/2013, 7:51 AM Pager: [phone]

## 2013-09-03 NOTE — Clinical Social Work Psychosocial (Signed)
Clinical Social Work Department BRIEF PSYCHOSOCIAL ASSESSMENT 09/03/2013  Patient:  Ashley [name], Ashley [name]     Account Number:  [id number]     Admit date:  08/27/2013  Clinical Social Worker:  Delrae Sawyers  Date/Time:  09/03/2013 11:24 AM  Referred by:  Physician  Date Referred:  09/03/2013 Referred for  SNF Placement   Other Referral:   none.   Interview type:  Patient Other interview type:   [name] also present at bedside.    PSYCHOSOCIAL DATA Living Status:  HUSBAND Admitted from facility:   Level of care:   Primary support name:  Ashley [name] Primary support relationship to patient:  SPOUSE Degree of support available:   Strong support system.    CURRENT CONCERNS Current Concerns  Post-Acute Placement   Other Concerns:   none.    SOCIAL WORK ASSESSMENT / PLAN Ashley [name] received notification from Ashley [name] regarding pt's change in discharge disposition. Per Ashley [name], PT previously recommended SNF placement but pt denied. Ashley [name] stated Ashley [name] has home health services, but pt and pt's family now requesting SNF placement.    Ashley [name] met with pt and pt's husband at bedside to discuss change in discharge disposition. [name] stated pt's family would like for pt to stay close for family to visit frequently. Pt stated she would like to her SNF options prior to making disposition decision. Ashley [name] to continue to follow and assist with discharge planning needs.   Assessment/plan status:  Psychosocial Support/Ongoing Assessment of Needs Other assessment/ plan:   none.   Information/referral to community resources:   Ashley [name] options.    PATIENT'S/FAMILY'S RESPONSE TO PLAN OF CARE: Pt and pt's husband are understanding and agreeable to Ashley [name] plan of care. Pt stated she is ready to return home, but understanding of needing rehabilitation prior to discharging home.       Ashley [name], MSW, Ashley [name] Licensed Clinical Social Worker [id number] and [phone]

## 2013-09-03 NOTE — Consult Note (Signed)
WOC ostomy follow up Stoma type/location: LLQ colostomy pouching system changed today. Stomal assessment/size: 1 and 1/4 inches round when tension applied at 12 o'clock, stoma is slightly oval at rest Peristomal assessment: intact, clear Treatment options for stomal/peristomal skin: skin barrier ring for support and mild convexity Output soft brown stool Ostomy pouching: 2pc. 2 and 1/4 ostomy pouching system.  Three complete "changes" are in room for discharge along with single-page instructions and ostomy teaching booklet.  Patient's husband took an additional booklet home on Sunday evening, 6/14. Patient's spouse has not yet made any of the toilet paper "wicks" needed for pouch emptying as directed on Sunday.  Asked for an additional demonstration of this technique today.  Provided husband with sample toilet paper "wick" so that he might start making a few here in hospital today. Education provided: Patient and husband observed demonstration of ostomy pouch removal, pouching system preparation and application.  Patient is only able to perform pouch closure with cueing and husband . Husband is in agreement that Rehab placement is a good idea for a couple of weeks until patient's appetite and strength returns. I will leave that to the CCS and nursing teams to manage. Enrolled patient in [facility] Start Discharge program: Yes [facility] nursing team will continue follow, and will remain available to this patient, the nursing and medical team.  Please re-consult if needed. Thanks, Maudie Flakes, MSN, RN, [place], [place], [place] [phone])

## 2013-09-03 NOTE — Consult Note (Signed)
CARDIOLOGY CONSULT NOTE   Patient ID: Ashley [name] MRN: [id number] DOB/AGE: June 27, 1928 80 y.o.  Admit Date: 08/27/2013 Primary Physician: Ashley Sake, MD Primary Cardiologist   Ashley [name]  Clinical Summary Ashley [name] is a 80 y.o.female. Is consulted to help her decisions about her anticoagulation. She has just had a surgical procedure. She has a history of bleeding.  Historically the patient has been on anticoagulation for the question of atrial fibrillation. Over the past several years she's been followed by Dr. Lovena [name] for the evaluation of syncope. It is thought that her syncope was probably autonomic. She wore a loop recorder. Eventually the loop recorder came to and of life. There was no atrial fibrillation seen. She had some nighttime pauses that are felt not to be significant. She also had some brief nighttime supraventricular tachycardia that was not described as atrial fibrillation.   No Known Allergies  Medications Scheduled Medications: . apraclonidine  1 drop Both Eyes QHS  . dorzolamide  1 drop Both Eyes BID  . famotidine  20 mg Oral Daily  . feeding supplement (ENSURE COMPLETE)  237 mL Oral BID BM  . heparin subcutaneous  5,000 Units Subcutaneous 3 times per day  . ketorolac  1 drop Both Eyes BID  . latanoprost  1 drop Both Eyes QHS  . magnesium oxide  400 mg Oral BID  . methylPREDNISolone  4 mg Oral Daily  . multivitamin with minerals  1 tablet Oral Daily  . pantoprazole  40 mg Oral Q1200  . polyvinyl alcohol  1 drop Both Eyes QHS  . sulfaSALAzine  500 mg Oral BID     Infusions: . lactated ringers 10 mL/hr at 08/28/13 1140  . 0.9 % sodium chloride with kcl 75 mL/hr at 09/02/13 1321     PRN Medications:  diphenhydrAMINE, diphenhydrAMINE, hydrocortisone, morphine injection, ondansetron   Past Medical History  Diagnosis Date  . Syncope   . Uterine prolapse   . Cryptococcal pneumonitis     right lower lobe  . GERD (gastroesophageal reflux  disease)   . Asthma   . YTKZSWFU(932.3)     "monthly" (08/13/2013)  . Rheumatoid arthritis(714.0)   . Arthritis     "all over" (08/13/2013)  . Choroid melanoma of left eye   . Renal cell carcinoma     Left kidney  . Colocutaneous fistula   . Diverticulitis   . Hypertension     Past Surgical History  Procedure Laterality Date  . Nephrectomy Left     native  . Bladder suspension    . Lung removal, partial    . Cholecystectomy    . Brain meningioma excision    . Loop recorder implant      Left Breast  . Colonoscopy N/A 05/21/2012    Procedure: COLONOSCOPY;  Surgeon: Ashley Silence, MD;  Location: [facility];  Service: Endoscopy;  Laterality: N/A;  . Tonsillectomy    . Eye surgery Left     Choroid melanoma  . Colostomy N/A 08/28/2013    Procedure: COLOSTOMY;  Surgeon: Ashley Ober, MD;  Location: [place];  Service: General;  Laterality: N/A;  . Colostomy revision N/A 08/28/2013    Procedure: COLON RESECTION SIGMOID;  Surgeon: Ashley Ober, MD;  [facility];  Service: General;  Laterality: N/A;    Family History  Problem Relation Age of Onset  . Heart disease Maternal Aunt     Social History Ashley [name] reports that she has never smoked. She has  never used smokeless tobacco. Ashley [name] reports that she does not drink alcohol.  Review of Systems   Physical Examination Blood pressure 134/78, pulse 79, temperature 99 F (37.2 C), temperature source Oral, resp. rate 18, height 5\' 4"  (1.626 m), weight 103 lb 11.2 oz (47.038 kg), SpO2 97.00%.  Intake/Output Summary (Last 24 hours) at 09/03/13 0948 Last data filed at 09/03/13 0933  Gross per 24 hour  Intake   1995 ml  Output   2390 ml  Net   -395 ml      Prior Cardiac Testing/Procedures  Lab Results  Basic Metabolic Panel:  Recent Labs Lab 08/27/13 1935  08/29/13 0345 08/31/13 0720 09/01/13 0515 09/01/13 0700 09/02/13 0630 09/03/13 0510  NA  --   < > 143 143 143  --  144 144  K  --   < > 4.8 3.8 3.6*  --   4.1 4.1  CL  --   < > 109 108 109  --  112 110  CO2  --   < > 20 23 24   --  22 22  GLUCOSE  --   < > 161* 79 73  --  71 71  BUN  --   < > 9 9 8   --  7 7  CREATININE  --   < > 0.78 0.72 0.68  --  0.57 0.65  CALCIUM  --   < > 9.3 9.0 8.7  --  8.7 8.8  MG 1.5  --   --   --   --  1.4*  --  1.5  < > = values in this interval not displayed.  Liver Function Tests:  Recent Labs Lab 08/27/13 1104 09/01/13 0515  AST 36 23  ALT 13 7  ALKPHOS 62 53  BILITOT 0.5 0.5  PROT 6.8 4.6*  ALBUMIN 2.7* 1.8*    CBC:  Recent Labs Lab 08/31/13 0720 08/31/13 0857 08/31/13 1804 09/01/13 0515 09/02/13 0630 09/03/13 0510  WBC 7.8  --  8.4 6.8 6.4 7.2  HGB 4.4* 4.8* 8.7* 8.0* 7.9* 8.4*  HCT 13.4* 14.6* 25.6* 23.4* 23.3* 25.2*  MCV 92.4  --  80.5 80.1 81.5 83.7  PLT 237  --  277 277 289 284    Cardiac Enzymes: No results found for this basename: CKTOTAL, CKMB, CKMBINDEX, TROPONINI,  in the last 168 hours  BNP: No components found with this basename: POCBNP,    Radiology: No results found.   ECG:   Impression and Recommendations  Anticoagulation can be stopped. It is okay to not restart Eliquis. Refer to the discussion below    SYNCOPE     There is a history of syncope. She had an implanted loop recorder that was assessed over time by Dr. Lovena [name]. There was no atrial fibrillation. There were mild pauses at night time. There was limited SVT at nighttime. It is felt that her syncope was most likely autonomic.    Colocutaneous fistula   Diverticulitis of large intestine with perforation     She's had successful surgery.    HX: anticoagulation     Anticoagulation has been used historically for a question of atrial fibrillation. There has been no atrial fibrillation seen in the past few years. The patient has definite relative contraindications to anticoagulation at this time. Anticoagulation should be discontinued at this time. There is no reason to restart her Eliquis going  forward.    SVT (supraventricular tachycardia)    She had limited nighttime SVT on her  loop recorder. This was not described as atrial fibrillation.  Ashley November, MD 09/03/2013, 9:48 AM

## 2013-09-03 NOTE — Progress Notes (Signed)
Seen and examined  nad Soft, nd, ostomy viable. Incision without cellulitis Drain - blood tinged  hgb stable  Cont diet, encourage po Pt/ot ?eliquis resumption Pull drain Should be ready placement as early as Friday. If resume eliquis pt will need cbc monitored over the weekend at SNF to monitor for signs of bleeding  Leighton Ruff. Redmond Pulling, MD, FACS General, Bariatric, & Minimally Invasive Surgery [facility], [place]

## 2013-09-04 DIAGNOSIS — K5732 Diverticulitis of large intestine without perforation or abscess without bleeding: Secondary | ICD-10-CM | POA: Diagnosis not present

## 2013-09-04 DIAGNOSIS — I10 Essential (primary) hypertension: Secondary | ICD-10-CM | POA: Diagnosis not present

## 2013-09-04 DIAGNOSIS — Z433 Encounter for attention to colostomy: Secondary | ICD-10-CM | POA: Diagnosis not present

## 2013-09-04 DIAGNOSIS — M069 Rheumatoid arthritis, unspecified: Secondary | ICD-10-CM | POA: Diagnosis not present

## 2013-09-04 DIAGNOSIS — D649 Anemia, unspecified: Secondary | ICD-10-CM | POA: Diagnosis not present

## 2013-09-04 DIAGNOSIS — I498 Other specified cardiac arrhythmias: Secondary | ICD-10-CM | POA: Diagnosis not present

## 2013-09-04 DIAGNOSIS — I4891 Unspecified atrial fibrillation: Secondary | ICD-10-CM | POA: Diagnosis not present

## 2013-09-04 DIAGNOSIS — E43 Unspecified severe protein-calorie malnutrition: Secondary | ICD-10-CM | POA: Diagnosis not present

## 2013-09-04 DIAGNOSIS — H5440 Blindness, one eye, unspecified eye: Secondary | ICD-10-CM | POA: Diagnosis not present

## 2013-09-04 DIAGNOSIS — Z85820 Personal history of malignant melanoma of skin: Secondary | ICD-10-CM | POA: Diagnosis not present

## 2013-09-04 DIAGNOSIS — Z5189 Encounter for other specified aftercare: Secondary | ICD-10-CM | POA: Diagnosis not present

## 2013-09-04 DIAGNOSIS — Z48815 Encounter for surgical aftercare following surgery on the digestive system: Secondary | ICD-10-CM | POA: Diagnosis not present

## 2013-09-04 DIAGNOSIS — R488 Other symbolic dysfunctions: Secondary | ICD-10-CM | POA: Diagnosis not present

## 2013-09-04 DIAGNOSIS — M6281 Muscle weakness (generalized): Secondary | ICD-10-CM | POA: Diagnosis not present

## 2013-09-04 DIAGNOSIS — K632 Fistula of intestine: Secondary | ICD-10-CM | POA: Diagnosis not present

## 2013-09-04 DIAGNOSIS — Z933 Colostomy status: Secondary | ICD-10-CM | POA: Diagnosis not present

## 2013-09-04 LAB — CBC
HEMATOCRIT: 28.6 % — AB (ref 36.0–46.0)
HEMOGLOBIN: 9.4 g/dL — AB (ref 12.0–15.0)
MCH: 27.7 pg (ref 26.0–34.0)
MCHC: 32.9 g/dL (ref 30.0–36.0)
MCV: 84.4 fL (ref 78.0–100.0)
Platelets: 285 10*3/uL (ref 150–400)
RBC: 3.39 MIL/uL — ABNORMAL LOW (ref 3.87–5.11)
RDW: 23 % — ABNORMAL HIGH (ref 11.5–15.5)
WBC: 6.7 10*3/uL (ref 4.0–10.5)

## 2013-09-04 MED ORDER — TRAMADOL HCL 50 MG PO TABS: 50.0000 mg | ORAL_TABLET | Freq: Three times a day (TID) | ORAL | Status: DC | PRN

## 2013-09-04 NOTE — Progress Notes (Signed)
Discussed with PA   Agree with plan  Leighton Ruff. Redmond Pulling, MD, FACS General, Bariatric, & Minimally Invasive Surgery [facility], [place]

## 2013-09-04 NOTE — Discharge Summary (Signed)
[facility] Discharge Summary   Patient ID: Ashley [name] MRN: [id number] DOB/AGE: Jul 23, 1928 80 y.o.  Admit date: 08/27/2013 Discharge date: 09/04/2013  Admitting Diagnosis: 1. Colocutaneous fistula s/p transgluteal drain for diverticulitis with abscess  2. Hx of renal cell carcinoma on the left with nephrectomy  3. 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  4. Rheumatoid arthritis on Medrol and sulfasalazine  5. Atrial fibrillation on chronic anticoagulation (Eliquis stopped on 08/25/13)  6. Hx of Syncope and bradycardia s/p loop recorder placement, 2011 Dr. Cristopher Peru. Episode in 2011  7. Weight loss/protien calorie malnutrition  8. Hx of anemia  9. Hx of rectal bleeding from internal hemorrhoids.05/2012  10. Dyslipidemia  11. Hx of asthma/pneumonia  12. Hx of UTI  13. Hx of monthly headaches  14. Hx of left lobectomy   Discharge Diagnosis Patient Active Problem List   Diagnosis Date Noted  . HX: anticoagulation 09/03/2013  . SVT (supraventricular tachycardia) 09/03/2013  . Diverticulitis of large intestine with perforation 08/27/2013  . Colocutaneous fistula 08/25/2013  . Diverticulitis 08/13/2013  . Diverticulitis of large intestine with abscess without bleeding 08/13/2013  . Anemia 08/13/2013  . UTI (urinary tract infection) 08/13/2013  . Protein-calorie malnutrition, severe 08/13/2013  . Encounter for therapeutic drug monitoring 04/10/2013  . Atrial fibrillation 05/28/2012  . GERD (gastroesophageal reflux disease) 05/28/2012  . Rheumatoid arthritis(714.0) 05/28/2012  . Renal cell carcinoma 05/28/2012  . Rectal bleeding 05/28/2012  . Acute blood loss anemia 05/28/2012  . Hypertension 11/03/2010  . SYNCOPE 05/03/2010    Consultants Cardiology  Imaging: No results found.  Procedures Dr. Hulen Skains (08/28/13) - Sigmoid colon resection and colostomy  Hospital Course:  80 y/o female was admitted on 08/13/13 with Sigmoid  diverticulitis and 3 abscess collections. She was placed on Invanz, and IR placed a drain. She was maintained on IV Invanz. Repeat CT showed: Near complete resolution of the catheter drained abscess adjacent to the sigmoid colon. There are 2 separate smaller fluid collections adjacent to the sigmoid colon which have decreased in size since  prior study. She was discharged home on 6/3 with the drain and oral antibiotics. She has had ongoing weight loss for some time, but continues to loss weight. She has no appetite, her husband does the cooking. She continues to have some discomfort LLQ and mid lower abdomen. She returned for follow up on 08/25/13, to see Dr. Hulen Skains. Exam show ongoing drainage from the current transgluteal drain that is feculent. It is his opinion she has a colocutaneous fistula and needs colectomy and colostomy. She is admitted now for surgery in the AM.   Patient was admitted and underwent procedure listed above.  Tolerated procedure well and was transferred to the floor.  She had a usual post op course and was started on clear liquids.  Diet was advanced as tolerated once bowel function resumed.  She was seen by [facility] for ostomy care/teaching.  She was noted to have ABL anemia on her chronic anemia and required 2 units pRBC transfusion on 08/31/13.  She soon stabilized and was restarted on heparin on 09/02/13.  We contacted cardiology to consult on the patient regarding necessity of resuming Eliquis anticoagulation for AFIB.  They concluded that she would not need to resume Eliquis upon discharge as she has not had further issues with AFIB for many years and given her definite relative contraindications there would be no indication to resume anticoagulation.  She was noted to have significant deconditioning  during her hospital stay.  PT was following her closely and recommended SNF at discharge.  Her elderly husband was also concerned about being able to take care of her upon discharge and  requested SNF at discharge.  JP drain was removed from her RLQ on POD #6.  She completed 8 days of IV antibiotics (Invanz) which was discontinued on POD #6.  On POD #7, the patient was voiding well, tolerating diet, ambulating well, pain well controlled, vital signs stable, incisions c/d/i and felt stable for discharge to SNF given her deconditioning and for additional recovery prior to returning home.  Patient will follow up in our office in 3 weeks and knows to call with questions or concerns.  Her midline wound is draining and her skin folds had become some what macerated.  Would continue to dress/pack these sufficiently to keep the surrounding skin dry.  Dressings should be changed at least daily, but may need to be checked TID for dryness.  She should have her staples out on POD #10-14 as long as the drainage around her wound has resolved.  Would error on the side of more caution and recommend leaving them in for 14 days.  These can be removed by the nurses at the SNF.        Medication List    STOP taking these medications       apixaban 2.5 MG Tabs tablet - cardiology has recommended d/c of this med  Commonly known as:  ELIQUIS     ciprofloxacin 500 MG tablet  Commonly known as:  CIPRO     hydrocortisone 2.5 % rectal cream - nothing per rectum   Commonly known as:  ANUSOL-HC      TAKE these medications       apraclonidine 0.5 % ophthalmic solution  Commonly known as:  IOPIDINE  Place 1 drop into both eyes at bedtime.     bromfenac 0.09 % ophthalmic solution  Commonly known as:  XIBROM  Place 1 drop into both eyes 2 (two) times daily.     CALCIUM 600 PO  Take 600 mg by mouth 2 (two) times daily.     cycloSPORINE 0.05 % ophthalmic emulsion  Commonly known as:  RESTASIS  Place 1 drop into both eyes 2 (two) times daily.     dorzolamide 2 % ophthalmic solution  Commonly known as:  TRUSOPT  Place 1 drop into both eyes 2 (two) times daily.     feeding supplement (ENSURE  COMPLETE) Liqd  Take 237 mLs by mouth 2 (two) times daily between meals.     fenofibrate 145 MG tablet  Commonly known as:  TRICOR  Take 145 mg by mouth daily.     hydrochlorothiazide 12.5 MG capsule  Commonly known as:  MICROZIDE  Take 12.5 mg by mouth daily.     latanoprost 0.005 % ophthalmic solution  Commonly known as:  XALATAN  Place 1 drop into both eyes at bedtime.     methylPREDNISolone 4 MG tablet  Commonly known as:  MEDROL  Take 4 mg by mouth daily.     multivitamin capsule  Take 1 capsule by mouth daily.     omega-3 acid ethyl esters 1 G capsule  Commonly known as:  LOVAZA  Take 1 g by mouth daily.     polyethylene glycol packet  Commonly known as:  MIRALAX / GLYCOLAX  Take 17 g by mouth daily.     REFRESH TEARS 0.5 % Soln  Generic drug:  carboxymethylcellulose  Place 1 drop into both eyes 2 (two) times daily.     sulfaSALAzine 500 MG tablet  Commonly known as:  AZULFIDINE  Take 500 mg by mouth 2 (two) times daily.     traMADol 50 MG tablet  Commonly known as:  ULTRAM  Take 1-2 tablets (50-100 mg total) by mouth every 8 (eight) hours as needed for moderate pain or severe pain.     trazodone 300 MG tablet  Commonly known as:  DESYREL  Take 300 mg by mouth at bedtime.     Vitamin D-3 5000 UNITS Tabs  Take 1 tablet by mouth daily.           Signed: Coralie Keens, [facility] [phone]  09/04/2013, 1:25 PM

## 2013-09-04 NOTE — Progress Notes (Signed)
[facility] Progress Note  7 Days Post-Op  Subjective: Feels well. Okay with going to SNF. Tolerating solid diet. Ostomy functioning. No N/V. Ambulating some. Working with PT/OT recommending SNF.  Midline wound still draining, but dryer than yesterday with bandage.   Objective: Vital signs in last 24 hours: Temp:  [98.1 F (36.7 C)-99 F (37.2 C)] 98.1 F (36.7 C) (06/19 0627) Pulse Rate:  [74-89] 74 (06/19 0627) Resp:  [16-18] 16 (06/19 0627) BP: (124-144)/(62-78) 124/62 mmHg (06/19 0627) SpO2:  [91 %-99 %] 94 % (06/19 0627) Last BM Date: 09/03/13  Intake/Output from previous day: 06/18 0701 - 06/19 0700 In: 1920 [P.O.:120; I.V.:1800] Out: 1500 [Urine:1500] Intake/Output this shift:    PE: Gen: Alert, NAD, pleasant  Abd: Soft, NT/ND, +BS, no HSM, incisions intact, with less serous drainage from lower midline wound, wound overall appears dryer with new dressing changes, JP drain removed yesterday, ostomy with brown soft output and flatus   Lab Results:   Recent Labs  09/02/13 0630 09/03/13 0510  WBC 6.4 7.2  HGB 7.9* 8.4*  HCT 23.3* 25.2*  PLT 289 284   BMET  Recent Labs  09/02/13 0630 09/03/13 0510  NA 144 144  K 4.1 4.1  CL 112 110  CO2 22 22  GLUCOSE 71 71  BUN 7 7  CREATININE 0.57 0.65  CALCIUM 8.7 8.8   PT/INR No results found for this basename: LABPROT, INR,  in the last 72 hours CMP     Component Value Date/Time   NA 144 09/03/2013 0510   K 4.1 09/03/2013 0510   CL 110 09/03/2013 0510   CO2 22 09/03/2013 0510   GLUCOSE 71 09/03/2013 0510   BUN 7 09/03/2013 0510   CREATININE 0.65 09/03/2013 0510   CALCIUM 8.8 09/03/2013 0510   PROT 4.6* 09/01/2013 0515   ALBUMIN 1.8* 09/01/2013 0515   AST 23 09/01/2013 0515   ALT 7 09/01/2013 0515   ALKPHOS 53 09/01/2013 0515   BILITOT 0.5 09/01/2013 0515   GFRNONAA 80* 09/03/2013 0510   GFRAA >90 09/03/2013 0510   Lipase  No results found for this basename: lipase       Studies/Results: No  results found.  Anti-infectives: Anti-infectives   Start     Dose/Rate Route Frequency Ordered Stop   08/27/13 1100  ertapenem (INVANZ) 1 g in sodium chloride 0.9 % 50 mL IVPB  Status:  Discontinued     1 g 100 mL/hr over 30 Minutes Intravenous Every 24 hours 08/27/13 1009 09/03/13 0928   08/27/13 1009  ciprofloxacin (CIPRO) IVPB 400 mg  Status:  Discontinued     400 mg 200 mL/hr over 60 Minutes Intravenous Every 12 hours 08/27/13 1010 08/27/13 1027   08/27/13 1009  metroNIDAZOLE (FLAGYL) IVPB 500 mg  Status:  Discontinued     500 mg 100 mL/hr over 60 Minutes Intravenous Every 8 hours 08/27/13 1010 08/27/13 1027       Assessment/Plan 1. Colocutaneous fistula s/p transgluteal drain for diverticulitis with abscess  COLOSTOMY, COLON RESECTION SIGMOID, 08/28/2013, Gwenyth Ober, MD  2. Hx of renal cell carcinoma on the left with nephrectomy  3. 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  4. Rheumatoid arthritis on Medrol and sulfasalazine  5. Atrial fibrillation on chronic anticoagulation (Eliquis stopped on 08/25/13)  6. Hx of Syncope and bradycardia s/p loop recorder placement, 2011 Dr. Cristopher Peru. Episode in 2011  7. Weight loss/protien calorie malnutrition  8. Hx of anemia  9. Hx of rectal bleeding from internal hemorrhoids.05/2012  10. Dyslipidemia  11. Hx of asthma/pneumonia  12. Hx of UTI  13. Hx of monthly headaches  14. Hx of left lobectomy  15. Acute blood loss anemia on chronic anemia (transfused 2 units PRBC 08/31/13)   Plan:  -Hgb stable now after 2 pRBC's, SQ heparin resumed yesterday, Hgb stable -Cardiology recommend not resuming Eliquis since no evidence of afib in the last few years and given her definite relative contraindications to anticoagulation at this time.  We will not resume it at discharge. -On low residue diet, ostomy working well  -Discontinued IV Invanz 8/8 day treatment for active diverticulitis at time of surgery   -JP drain  removed yesterday -Continue midline wound packing/dressing -SNF today if bed available    LOS: 8 days    DORT, MEGAN 09/04/2013, 8:25 AM Pager: [phone]

## 2013-09-04 NOTE — Discharge Summary (Signed)
Eric M. Wilson, MD, FACS General, Bariatric, & Minimally Invasive Surgery [facility]  

## 2013-09-07 ENCOUNTER — Telehealth (INDEPENDENT_AMBULATORY_CARE_PROVIDER_SITE_OTHER): Payer: Self-pay

## 2013-09-07 ENCOUNTER — Encounter: Payer: Self-pay | Admitting: Internal Medicine

## 2013-09-07 ENCOUNTER — Non-Acute Institutional Stay (SKILLED_NURSING_FACILITY): Payer: Medicare Other | Admitting: Internal Medicine

## 2013-09-07 ENCOUNTER — Other Ambulatory Visit: Payer: Self-pay | Admitting: *Deleted

## 2013-09-07 DIAGNOSIS — C642 Malignant neoplasm of left kidney, except renal pelvis: Secondary | ICD-10-CM

## 2013-09-07 DIAGNOSIS — N3000 Acute cystitis without hematuria: Secondary | ICD-10-CM

## 2013-09-07 DIAGNOSIS — I4891 Unspecified atrial fibrillation: Secondary | ICD-10-CM

## 2013-09-07 DIAGNOSIS — K632 Fistula of intestine: Secondary | ICD-10-CM | POA: Diagnosis not present

## 2013-09-07 DIAGNOSIS — C649 Malignant neoplasm of unspecified kidney, except renal pelvis: Secondary | ICD-10-CM

## 2013-09-07 DIAGNOSIS — I482 Chronic atrial fibrillation, unspecified: Secondary | ICD-10-CM

## 2013-09-07 DIAGNOSIS — M069 Rheumatoid arthritis, unspecified: Secondary | ICD-10-CM | POA: Diagnosis not present

## 2013-09-07 DIAGNOSIS — Z933 Colostomy status: Secondary | ICD-10-CM

## 2013-09-07 DIAGNOSIS — R55 Syncope and collapse: Secondary | ICD-10-CM

## 2013-09-07 DIAGNOSIS — I10 Essential (primary) hypertension: Secondary | ICD-10-CM

## 2013-09-07 DIAGNOSIS — D62 Acute posthemorrhagic anemia: Secondary | ICD-10-CM

## 2013-09-07 DIAGNOSIS — E43 Unspecified severe protein-calorie malnutrition: Secondary | ICD-10-CM

## 2013-09-07 DIAGNOSIS — E785 Hyperlipidemia, unspecified: Secondary | ICD-10-CM

## 2013-09-07 MED ORDER — TRAMADOL HCL 50 MG PO TABS: ORAL_TABLET | ORAL | Status: DC

## 2013-09-07 NOTE — Assessment & Plan Note (Signed)
Was on eliquis prior which cards has said not to restart 2/2 other comorbidities and because she has had no issue with afib for many years

## 2013-09-07 NOTE — Progress Notes (Signed)
MRN: [id number] Name: Ashley [name]  Sex: female Age: 80 y.o. DOB: 19-Oct-1928  [place] #: Helene Kelp  Facility/Room: 308 Level Of Care: SNF Provider: Inocencio Homes D Emergency Contacts: Extended Emergency Contact Information Primary Emergency Contact: Diantonio,Clarence Address: [address]          [place], [place] [address] Johnnette Litter of [place] Phone: [phone] Mobile Phone: [phone] Relation: Spouse Secondary Emergency Contact: Saputo,Larry  [place] Work Phone: [phone] Mobile Phone: [phone] Relation: Son  Code Status:FULL   Allergies: Review of patient's allergies indicates no known allergies.  Chief Complaint  Patient presents with  . nursing home admission    HPI: Patient is 80 y.o. female who is admitted to SNF for generalized weakness for OT/PT and wound care of recent colectomy site and colostomy.  Past Medical History  Diagnosis Date  . Syncope   . Uterine prolapse   . Cryptococcal pneumonitis     right lower lobe  . GERD (gastroesophageal reflux disease)   . Asthma   . MHWKGSUP(103.1)     "monthly" (08/13/2013)  . Rheumatoid arthritis(714.0)   . Arthritis     "all over" (08/13/2013)  . Choroid melanoma of left eye   . Renal cell carcinoma     Left kidney  . Colocutaneous fistula   . Diverticulitis   . Hypertension   . Hyperlipidemia     Past Surgical History  Procedure Laterality Date  . Nephrectomy Left     native  . Bladder suspension    . Lung removal, partial    . Cholecystectomy    . Brain meningioma excision    . Loop recorder implant      Left Breast  . Colonoscopy N/A 05/21/2012    Procedure: COLONOSCOPY;  Surgeon: Arta Silence, MD;  Location: [facility];  Service: Endoscopy;  Laterality: N/A;  . Tonsillectomy    . Eye surgery Left     Choroid melanoma  . Colostomy N/A 08/28/2013    Procedure: COLOSTOMY;  Surgeon: Gwenyth Ober, MD;  Location: [facility];  Service: General;  Laterality: N/A;  .  Colostomy revision N/A 08/28/2013    Procedure: COLON RESECTION SIGMOID;  Surgeon: Gwenyth Ober, MD;  Location: [facility];  Service: General;  Laterality: N/A;      Medication List       This list is accurate as of: 09/07/13  4:19 PM.  Always use your most recent med list.               apraclonidine 0.5 % ophthalmic solution  Commonly known as:  IOPIDINE  Place 1 drop into both eyes at bedtime.     bromfenac 0.09 % ophthalmic solution  Commonly known as:  XIBROM  Place 1 drop into both eyes 2 (two) times daily.     CALCIUM 600 PO  Take 600 mg by mouth 2 (two) times daily.     cycloSPORINE 0.05 % ophthalmic emulsion  Commonly known as:  RESTASIS  Place 1 drop into both eyes 2 (two) times daily.     dorzolamide 2 % ophthalmic solution  Commonly known as:  TRUSOPT  Place 1 drop into both eyes 2 (two) times daily.     feeding supplement (ENSURE COMPLETE) Liqd  Take 237 mLs by mouth 2 (two) times daily between meals.     fenofibrate 145 MG tablet  Commonly known as:  TRICOR  Take 145 mg by mouth daily.     hydrochlorothiazide 12.5 MG capsule  Commonly known as:  MICROZIDE  Take 12.5 mg by mouth daily.     latanoprost 0.005 % ophthalmic solution  Commonly known as:  XALATAN  Place 1 drop into both eyes at bedtime.     methylPREDNISolone 4 MG tablet  Commonly known as:  MEDROL  Take 4 mg by mouth daily.     multivitamin capsule  Take 1 capsule by mouth daily.     omega-3 acid ethyl esters 1 G capsule  Commonly known as:  LOVAZA  Take 1 g by mouth daily.     polyethylene glycol packet  Commonly known as:  MIRALAX / GLYCOLAX  Take 17 g by mouth daily.     REFRESH TEARS 0.5 % Soln  Generic drug:  carboxymethylcellulose  Place 1 drop into both eyes 2 (two) times daily.     sulfaSALAzine 500 MG tablet  Commonly known as:  AZULFIDINE  Take 500 mg by mouth 2 (two) times daily.     traMADol 50 MG tablet  Commonly known as:  ULTRAM  Take one tablet by mouth every  8 hours as needed for moderate pain; Take two tablets by mouth every 8 hours as needed for severe pain     trazodone 300 MG tablet  Commonly known as:  DESYREL  Take 300 mg by mouth at bedtime.     Vitamin D-3 5000 UNITS Tabs  Take 1 tablet by mouth daily.        No orders of the defined types were placed in this encounter.    Immunization History  Administered Date(s) Administered  . Pneumococcal Polysaccharide-23 08/15/2013    History  Substance Use Topics  . Smoking status: Never Smoker   . Smokeless tobacco: Never Used  . Alcohol Use: No    Family history is noncontributory    Review of Systems  DATA OBTAINED: from patient; no c/o, ready to go home GENERAL:  no fevers, fatigue, appetite changes SKIN: No itching, rash; incision feels OK EYES: No eye pain, redness, discharge EARS: No earache, tinnitus, change in hearing NOSE: No congestion, drainage or bleeding  MOUTH/THROAT: No mouth or tooth pain,  RESPIRATORY: No cough, wheezing, SOB CARDIAC: No chest pain, palpitations, lower extremity edema  GI: No abdominal pain, No N/V/D or constipation, No heartburn or reflux  GU: No dysuria, frequency or urgency, or incontinence  MUSCULOSKELETAL: No unrelieved bone/joint pain NEUROLOGIC: No headache, dizziness or focal weakness PSYCHIATRIC: No overt anxiety or sadness. Sleeps well. No behavior issue.   Filed Vitals:   09/07/13 1357  BP: 107/73  Pulse: 92  Temp: 97 F (36.1 C)  Resp: 20    Physical Exam  GENERAL APPEARANCE: Alert, conversant. Appropriately groomed. No acute distress.  SKIN: No diaphoresis rash; incision site with staples in place, no signs infection HEAD: Normocephalic, atraumatic  EYES: Conjunctiva/lids clear. Pupils round, reactive. EOMs intact.  EARS: External exam WNL, canals clear. Hearing grossly normal.  NOSE: No deformity or discharge.  MOUTH/THROAT: Lips w/o lesions RESPIRATORY: Breathing is even, unlabored. Lung sounds are clear    CARDIOVASCULAR: Heart RRR no murmurs, rubs or gallops. No peripheral edema.  GASTROINTESTINAL: Abdomen is soft, non-tender, not distended w/ normal bowel sounds; small stool,large gas in colostomy tube GENITOURINARY: Bladder non tender, not distended  MUSCULOSKELETAL: No abnormal joints or musculature NEUROLOGIC: Oriented X3. Cranial nerves 2-12 grossly intact. Moves all extremities no tremor. PSYCHIATRIC: Mood and affect appropriate to situation, no behavioral issues  Patient Active Problem List   Diagnosis Date Noted  .  Colostomy in place 09/07/2013  . Hyperlipidemia   . HX: anticoagulation 09/03/2013  . SVT (supraventricular tachycardia) 09/03/2013  . Diverticulitis of large intestine with perforation 08/27/2013  . Colocutaneous fistula 08/25/2013  . Diverticulitis 08/13/2013  . Diverticulitis of large intestine with abscess without bleeding 08/13/2013  . Anemia 08/13/2013  . UTI (urinary tract infection) 08/13/2013  . Protein-calorie malnutrition, severe 08/13/2013  . Encounter for therapeutic drug monitoring 04/10/2013  . Atrial fibrillation 05/28/2012  . GERD (gastroesophageal reflux disease) 05/28/2012  . Rheumatoid arthritis(714.0) 05/28/2012  . Renal cell carcinoma 05/28/2012  . Rectal bleeding 05/28/2012  . Acute blood loss anemia 05/28/2012  . Hypertension 11/03/2010  . SYNCOPE 05/03/2010    CBC    Component Value Date/Time   WBC 6.7 09/04/2013 1045   WBC 10.0 08/15/2006 1048   RBC 3.39* 09/04/2013 1045   RBC 3.75 08/15/2006 1048   HGB 9.4* 09/04/2013 1045   HGB 12.2 08/15/2006 1048   HCT 28.6* 09/04/2013 1045   HCT 34.8 08/15/2006 1048   PLT 285 09/04/2013 1045   PLT 229 08/15/2006 1048   MCV 84.4 09/04/2013 1045   MCV 93.0 08/15/2006 1048   LYMPHSABS 1.4 08/19/2013 0550   LYMPHSABS 2.1 08/15/2006 1048   MONOABS 0.6 08/19/2013 0550   MONOABS 0.9 08/15/2006 1048   EOSABS 0.2 08/19/2013 0550   EOSABS 0.1 08/15/2006 1048   BASOSABS 0.0 08/19/2013 0550   BASOSABS 0.0 08/15/2006  1048    CMP     Component Value Date/Time   NA 144 09/03/2013 0510   K 4.1 09/03/2013 0510   CL 110 09/03/2013 0510   CO2 22 09/03/2013 0510   GLUCOSE 71 09/03/2013 0510   BUN 7 09/03/2013 0510   CREATININE 0.65 09/03/2013 0510   CALCIUM 8.8 09/03/2013 0510   PROT 4.6* 09/01/2013 0515   ALBUMIN 1.8* 09/01/2013 0515   AST 23 09/01/2013 0515   ALT 7 09/01/2013 0515   ALKPHOS 53 09/01/2013 0515   BILITOT 0.5 09/01/2013 0515   GFRNONAA 80* 09/03/2013 0510   GFRAA >90 09/03/2013 0510    Assessment and Plan  Colocutaneous fistula admitted on 08/13/13 with Sigmoid diverticulitis and 3 abscess collections. She was placed on Invanz, and IR placed a drain. She was maintained on IV Invanz. Repeat CT showed: Near complete resolution of the catheter drained abscess adjacent to the sigmoid colon. There are 2 separate smaller fluid collections adjacent to the sigmoid colon which have decreased in size since  prior study. She was discharged home on 6/3 with the drain and oral antibiotics. She has had ongoing weight loss for some time, but continues to loss weight. She has no appetite, her husband does the cooking. She continues to have some discomfort LLQ and mid lower abdomen. She returned for follow up on 08/25/13, to see Dr. Hulen Skains. Exam show ongoing drainage from the current transgluteal drain that is feculent. It is his opinion she has a colocutaneous fistula and needs colectomy and colostomy   Colostomy in place Pt s/p colectomy and colostomy on 6/10; JP drains and Invanz were discontinued on POD 6  Atrial fibrillation Was on eliquis prior which cards has said not to restart 2/2 other comorbidities and because she has had no issue with afib for many years  Rheumatoid arthritis(714.0) Maintained on medrol 4 mg  SYNCOPE S/p loop recorder placement 2011  Renal cell carcinoma S/p L nephrectomy  UTI (urinary tract infection) On 5/28-treated  Acute blood loss anemia requiring tx of 2 units in the  post  op period with Hb 9.4 after  Protein-calorie malnutrition, severe Supplements to diet  Hypertension HCTZ 12.5 daily only med  Hyperlipidemia Continue fenofibrate and lovaza    Hennie Duos, MD

## 2013-09-07 NOTE — Assessment & Plan Note (Signed)
requiring tx of 2 units in the post op period with Hb 9.4 after

## 2013-09-07 NOTE — Assessment & Plan Note (Signed)
HCTZ 12.5 daily only med

## 2013-09-07 NOTE — Assessment & Plan Note (Addendum)
Maintained on medrol 4 mg

## 2013-09-07 NOTE — Assessment & Plan Note (Signed)
S/p L nephrectomy

## 2013-09-07 NOTE — Assessment & Plan Note (Signed)
S/p loop recorder placement 2011

## 2013-09-07 NOTE — Assessment & Plan Note (Signed)
Supplements to diet

## 2013-09-07 NOTE — Assessment & Plan Note (Signed)
On 5/28-treated

## 2013-09-07 NOTE — Assessment & Plan Note (Signed)
Continue fenofibrate and lovaza

## 2013-09-07 NOTE — Assessment & Plan Note (Signed)
admitted on 08/13/13 with Sigmoid diverticulitis and 3 abscess collections. She was placed on Invanz, and IR placed a drain. She was maintained on IV Invanz. Repeat CT showed: Near complete resolution of the catheter drained abscess adjacent to the sigmoid colon. There are 2 separate smaller fluid collections adjacent to the sigmoid colon which have decreased in size since  prior study. She was discharged home on 6/3 with the drain and oral antibiotics. She has had ongoing weight loss for some time, but continues to loss weight. She has no appetite, her husband does the cooking. She continues to have some discomfort LLQ and mid lower abdomen. She returned for follow up on 08/25/13, to see Dr. Hulen Skains. Exam show ongoing drainage from the current transgluteal drain that is feculent. It is his opinion she has a colocutaneous fistula and needs colectomy and colostomy

## 2013-09-07 NOTE — Assessment & Plan Note (Signed)
Pt s/p colectomy and colostomy on 6/10; JP drains and Invanz were discontinued on POD 6

## 2013-09-07 NOTE — Telephone Encounter (Signed)
Message copied by Carlene Coria on Mon Sep 07, 2013  9:08 AM ------      Message from: Marlan Palau      Created: Fri Sep 04, 2013  2:00 PM      Regarding: I think this is yours!                   ----- Message -----         From: Coralie Keens, PA-C         Sent: 09/04/2013   1:10 PM           To: [facility]            F/u appt with Dr. Hulen Skains in 3 weeks for post op check            EC fistula s/p sigmoid colectomy/colostomy for diverticulitis ------

## 2013-09-07 NOTE — Telephone Encounter (Signed)
Left msg on VM for pt or pt family to call back to arrange po appt. Pt was discharged to SNF but I have no idea which one she is at. I made pt an appt to come back and see Dr Hulen Skains on 09/22/13 at 10:00. Need to contact SNF she is at to make sure they can bring her to her appt.

## 2013-09-07 NOTE — Telephone Encounter (Signed)
[facility] 

## 2013-09-09 DIAGNOSIS — K5733 Diverticulitis of large intestine without perforation or abscess with bleeding: Secondary | ICD-10-CM | POA: Diagnosis not present

## 2013-09-09 DIAGNOSIS — D649 Anemia, unspecified: Secondary | ICD-10-CM | POA: Diagnosis not present

## 2013-09-09 DIAGNOSIS — E43 Unspecified severe protein-calorie malnutrition: Secondary | ICD-10-CM | POA: Diagnosis not present

## 2013-09-09 DIAGNOSIS — C649 Malignant neoplasm of unspecified kidney, except renal pelvis: Secondary | ICD-10-CM | POA: Diagnosis not present

## 2013-09-09 DIAGNOSIS — M069 Rheumatoid arthritis, unspecified: Secondary | ICD-10-CM | POA: Diagnosis not present

## 2013-09-09 DIAGNOSIS — N390 Urinary tract infection, site not specified: Secondary | ICD-10-CM | POA: Diagnosis not present

## 2013-09-10 DIAGNOSIS — M069 Rheumatoid arthritis, unspecified: Secondary | ICD-10-CM | POA: Diagnosis not present

## 2013-09-10 DIAGNOSIS — K5733 Diverticulitis of large intestine without perforation or abscess with bleeding: Secondary | ICD-10-CM | POA: Diagnosis not present

## 2013-09-10 DIAGNOSIS — N390 Urinary tract infection, site not specified: Secondary | ICD-10-CM | POA: Diagnosis not present

## 2013-09-10 DIAGNOSIS — E43 Unspecified severe protein-calorie malnutrition: Secondary | ICD-10-CM | POA: Diagnosis not present

## 2013-09-10 DIAGNOSIS — D649 Anemia, unspecified: Secondary | ICD-10-CM | POA: Diagnosis not present

## 2013-09-10 DIAGNOSIS — C649 Malignant neoplasm of unspecified kidney, except renal pelvis: Secondary | ICD-10-CM | POA: Diagnosis not present

## 2013-09-11 DIAGNOSIS — K5733 Diverticulitis of large intestine without perforation or abscess with bleeding: Secondary | ICD-10-CM | POA: Diagnosis not present

## 2013-09-11 DIAGNOSIS — C649 Malignant neoplasm of unspecified kidney, except renal pelvis: Secondary | ICD-10-CM | POA: Diagnosis not present

## 2013-09-11 DIAGNOSIS — M069 Rheumatoid arthritis, unspecified: Secondary | ICD-10-CM | POA: Diagnosis not present

## 2013-09-11 DIAGNOSIS — N390 Urinary tract infection, site not specified: Secondary | ICD-10-CM | POA: Diagnosis not present

## 2013-09-11 DIAGNOSIS — E43 Unspecified severe protein-calorie malnutrition: Secondary | ICD-10-CM | POA: Diagnosis not present

## 2013-09-11 DIAGNOSIS — D649 Anemia, unspecified: Secondary | ICD-10-CM | POA: Diagnosis not present

## 2013-09-14 DIAGNOSIS — C649 Malignant neoplasm of unspecified kidney, except renal pelvis: Secondary | ICD-10-CM | POA: Diagnosis not present

## 2013-09-14 DIAGNOSIS — M069 Rheumatoid arthritis, unspecified: Secondary | ICD-10-CM | POA: Diagnosis not present

## 2013-09-14 DIAGNOSIS — D649 Anemia, unspecified: Secondary | ICD-10-CM | POA: Diagnosis not present

## 2013-09-14 DIAGNOSIS — E43 Unspecified severe protein-calorie malnutrition: Secondary | ICD-10-CM | POA: Diagnosis not present

## 2013-09-14 DIAGNOSIS — N390 Urinary tract infection, site not specified: Secondary | ICD-10-CM | POA: Diagnosis not present

## 2013-09-14 DIAGNOSIS — K5733 Diverticulitis of large intestine without perforation or abscess with bleeding: Secondary | ICD-10-CM | POA: Diagnosis not present

## 2013-09-17 DIAGNOSIS — D649 Anemia, unspecified: Secondary | ICD-10-CM | POA: Diagnosis not present

## 2013-09-17 DIAGNOSIS — K5733 Diverticulitis of large intestine without perforation or abscess with bleeding: Secondary | ICD-10-CM | POA: Diagnosis not present

## 2013-09-17 DIAGNOSIS — M069 Rheumatoid arthritis, unspecified: Secondary | ICD-10-CM | POA: Diagnosis not present

## 2013-09-17 DIAGNOSIS — C649 Malignant neoplasm of unspecified kidney, except renal pelvis: Secondary | ICD-10-CM | POA: Diagnosis not present

## 2013-09-17 DIAGNOSIS — N390 Urinary tract infection, site not specified: Secondary | ICD-10-CM | POA: Diagnosis not present

## 2013-09-17 DIAGNOSIS — E43 Unspecified severe protein-calorie malnutrition: Secondary | ICD-10-CM | POA: Diagnosis not present

## 2013-09-21 DIAGNOSIS — R634 Abnormal weight loss: Secondary | ICD-10-CM | POA: Diagnosis not present

## 2013-09-21 DIAGNOSIS — N390 Urinary tract infection, site not specified: Secondary | ICD-10-CM | POA: Diagnosis not present

## 2013-09-21 DIAGNOSIS — R5381 Other malaise: Secondary | ICD-10-CM | POA: Diagnosis not present

## 2013-09-21 DIAGNOSIS — E43 Unspecified severe protein-calorie malnutrition: Secondary | ICD-10-CM | POA: Diagnosis not present

## 2013-09-21 DIAGNOSIS — Z933 Colostomy status: Secondary | ICD-10-CM | POA: Diagnosis not present

## 2013-09-21 DIAGNOSIS — K5733 Diverticulitis of large intestine without perforation or abscess with bleeding: Secondary | ICD-10-CM | POA: Diagnosis not present

## 2013-09-21 DIAGNOSIS — M069 Rheumatoid arthritis, unspecified: Secondary | ICD-10-CM | POA: Diagnosis not present

## 2013-09-21 DIAGNOSIS — C649 Malignant neoplasm of unspecified kidney, except renal pelvis: Secondary | ICD-10-CM | POA: Diagnosis not present

## 2013-09-21 DIAGNOSIS — R5383 Other fatigue: Secondary | ICD-10-CM | POA: Diagnosis not present

## 2013-09-21 DIAGNOSIS — D649 Anemia, unspecified: Secondary | ICD-10-CM | POA: Diagnosis not present

## 2013-09-22 ENCOUNTER — Encounter (INDEPENDENT_AMBULATORY_CARE_PROVIDER_SITE_OTHER): Payer: Self-pay | Admitting: General Surgery

## 2013-09-22 ENCOUNTER — Ambulatory Visit (INDEPENDENT_AMBULATORY_CARE_PROVIDER_SITE_OTHER): Payer: Medicare Other | Admitting: General Surgery

## 2013-09-22 VITALS — BP 96/62 | HR 74 | Resp 12 | Ht 64.0 in | Wt 95.6 lb

## 2013-09-22 DIAGNOSIS — C649 Malignant neoplasm of unspecified kidney, except renal pelvis: Secondary | ICD-10-CM | POA: Diagnosis not present

## 2013-09-22 DIAGNOSIS — K5733 Diverticulitis of large intestine without perforation or abscess with bleeding: Secondary | ICD-10-CM | POA: Diagnosis not present

## 2013-09-22 DIAGNOSIS — M069 Rheumatoid arthritis, unspecified: Secondary | ICD-10-CM | POA: Diagnosis not present

## 2013-09-22 DIAGNOSIS — N390 Urinary tract infection, site not specified: Secondary | ICD-10-CM | POA: Diagnosis not present

## 2013-09-22 DIAGNOSIS — E43 Unspecified severe protein-calorie malnutrition: Secondary | ICD-10-CM | POA: Diagnosis not present

## 2013-09-22 DIAGNOSIS — Z09 Encounter for follow-up examination after completed treatment for conditions other than malignant neoplasm: Secondary | ICD-10-CM | POA: Insufficient documentation

## 2013-09-22 DIAGNOSIS — D649 Anemia, unspecified: Secondary | ICD-10-CM | POA: Diagnosis not present

## 2013-09-22 NOTE — Progress Notes (Signed)
Subjective:     Patient ID: Ashley [name], female   DOB: Nov 04, 1928, 80 y.o.   MRN: [id number]  HPI The patient is very weak and has lost a significant amount of weight since surgery. He also complained that her stoma bag does not stick in place. Her staples are still in place.  Review of Systems Weight loss. Poor appetite. No fevers or chills. Occasional blood in the urine. Occasional bowel movement through her rectum with small amounts of blood. Leakage around her colostomy site.    Objective:   Physical Exam Her colostomy is healthy with no evidence of infection. There is no detachment. The midline wound is slightly reddened but intact. Her staples were removed. The Steri-Strips are placed. There was no leakage from her colostomy bag at this time. Her abdomen is completely nontender.  The patient is cachectic and has lost weight and is down to 96 pounds. I will see again in about 3 weeks.    Assessment:     The patient is doing fair status post colectomy and colostomy for a colocutaneous fistula.     Plan:     Improved nutrition. Keep her wound clean with soap and water. We will not consider reversal of her colostomy until she's gained weight and is doing much better overall.

## 2013-09-23 DIAGNOSIS — N390 Urinary tract infection, site not specified: Secondary | ICD-10-CM | POA: Diagnosis not present

## 2013-09-23 DIAGNOSIS — D649 Anemia, unspecified: Secondary | ICD-10-CM | POA: Diagnosis not present

## 2013-09-23 DIAGNOSIS — E43 Unspecified severe protein-calorie malnutrition: Secondary | ICD-10-CM | POA: Diagnosis not present

## 2013-09-23 DIAGNOSIS — K5733 Diverticulitis of large intestine without perforation or abscess with bleeding: Secondary | ICD-10-CM | POA: Diagnosis not present

## 2013-09-23 DIAGNOSIS — M069 Rheumatoid arthritis, unspecified: Secondary | ICD-10-CM | POA: Diagnosis not present

## 2013-09-23 DIAGNOSIS — C649 Malignant neoplasm of unspecified kidney, except renal pelvis: Secondary | ICD-10-CM | POA: Diagnosis not present

## 2013-09-24 ENCOUNTER — Other Ambulatory Visit: Payer: Self-pay | Admitting: Internal Medicine

## 2013-09-24 DIAGNOSIS — K5733 Diverticulitis of large intestine without perforation or abscess with bleeding: Secondary | ICD-10-CM | POA: Diagnosis not present

## 2013-09-24 DIAGNOSIS — E43 Unspecified severe protein-calorie malnutrition: Secondary | ICD-10-CM | POA: Diagnosis not present

## 2013-09-24 DIAGNOSIS — D649 Anemia, unspecified: Secondary | ICD-10-CM | POA: Diagnosis not present

## 2013-09-24 DIAGNOSIS — C649 Malignant neoplasm of unspecified kidney, except renal pelvis: Secondary | ICD-10-CM | POA: Diagnosis not present

## 2013-09-24 DIAGNOSIS — M069 Rheumatoid arthritis, unspecified: Secondary | ICD-10-CM | POA: Diagnosis not present

## 2013-09-24 DIAGNOSIS — N390 Urinary tract infection, site not specified: Secondary | ICD-10-CM | POA: Diagnosis not present

## 2013-09-25 DIAGNOSIS — C649 Malignant neoplasm of unspecified kidney, except renal pelvis: Secondary | ICD-10-CM | POA: Diagnosis not present

## 2013-09-25 DIAGNOSIS — N390 Urinary tract infection, site not specified: Secondary | ICD-10-CM | POA: Diagnosis not present

## 2013-09-25 DIAGNOSIS — E43 Unspecified severe protein-calorie malnutrition: Secondary | ICD-10-CM | POA: Diagnosis not present

## 2013-09-25 DIAGNOSIS — M069 Rheumatoid arthritis, unspecified: Secondary | ICD-10-CM | POA: Diagnosis not present

## 2013-09-25 DIAGNOSIS — K5733 Diverticulitis of large intestine without perforation or abscess with bleeding: Secondary | ICD-10-CM | POA: Diagnosis not present

## 2013-09-25 DIAGNOSIS — D649 Anemia, unspecified: Secondary | ICD-10-CM | POA: Diagnosis not present

## 2013-09-28 ENCOUNTER — Encounter: Payer: Self-pay | Admitting: Cardiology

## 2013-09-28 DIAGNOSIS — K5733 Diverticulitis of large intestine without perforation or abscess with bleeding: Secondary | ICD-10-CM | POA: Diagnosis not present

## 2013-09-28 DIAGNOSIS — D649 Anemia, unspecified: Secondary | ICD-10-CM | POA: Diagnosis not present

## 2013-09-28 DIAGNOSIS — C649 Malignant neoplasm of unspecified kidney, except renal pelvis: Secondary | ICD-10-CM | POA: Diagnosis not present

## 2013-09-28 DIAGNOSIS — N390 Urinary tract infection, site not specified: Secondary | ICD-10-CM | POA: Diagnosis not present

## 2013-09-28 DIAGNOSIS — M069 Rheumatoid arthritis, unspecified: Secondary | ICD-10-CM | POA: Diagnosis not present

## 2013-09-28 DIAGNOSIS — E43 Unspecified severe protein-calorie malnutrition: Secondary | ICD-10-CM | POA: Diagnosis not present

## 2013-09-30 DIAGNOSIS — N390 Urinary tract infection, site not specified: Secondary | ICD-10-CM | POA: Diagnosis not present

## 2013-09-30 DIAGNOSIS — E43 Unspecified severe protein-calorie malnutrition: Secondary | ICD-10-CM | POA: Diagnosis not present

## 2013-09-30 DIAGNOSIS — K5733 Diverticulitis of large intestine without perforation or abscess with bleeding: Secondary | ICD-10-CM | POA: Diagnosis not present

## 2013-09-30 DIAGNOSIS — C649 Malignant neoplasm of unspecified kidney, except renal pelvis: Secondary | ICD-10-CM | POA: Diagnosis not present

## 2013-09-30 DIAGNOSIS — M069 Rheumatoid arthritis, unspecified: Secondary | ICD-10-CM | POA: Diagnosis not present

## 2013-09-30 DIAGNOSIS — D649 Anemia, unspecified: Secondary | ICD-10-CM | POA: Diagnosis not present

## 2013-10-01 DIAGNOSIS — M810 Age-related osteoporosis without current pathological fracture: Secondary | ICD-10-CM | POA: Diagnosis not present

## 2013-10-01 DIAGNOSIS — R109 Unspecified abdominal pain: Secondary | ICD-10-CM | POA: Diagnosis not present

## 2013-10-01 DIAGNOSIS — E782 Mixed hyperlipidemia: Secondary | ICD-10-CM | POA: Diagnosis not present

## 2013-10-01 DIAGNOSIS — K219 Gastro-esophageal reflux disease without esophagitis: Secondary | ICD-10-CM | POA: Diagnosis not present

## 2013-10-01 DIAGNOSIS — G4700 Insomnia, unspecified: Secondary | ICD-10-CM | POA: Diagnosis not present

## 2013-10-02 DIAGNOSIS — D649 Anemia, unspecified: Secondary | ICD-10-CM | POA: Diagnosis not present

## 2013-10-02 DIAGNOSIS — M069 Rheumatoid arthritis, unspecified: Secondary | ICD-10-CM | POA: Diagnosis not present

## 2013-10-02 DIAGNOSIS — N390 Urinary tract infection, site not specified: Secondary | ICD-10-CM | POA: Diagnosis not present

## 2013-10-02 DIAGNOSIS — E43 Unspecified severe protein-calorie malnutrition: Secondary | ICD-10-CM | POA: Diagnosis not present

## 2013-10-02 DIAGNOSIS — K5733 Diverticulitis of large intestine without perforation or abscess with bleeding: Secondary | ICD-10-CM | POA: Diagnosis not present

## 2013-10-02 DIAGNOSIS — C649 Malignant neoplasm of unspecified kidney, except renal pelvis: Secondary | ICD-10-CM | POA: Diagnosis not present

## 2013-10-05 DIAGNOSIS — E43 Unspecified severe protein-calorie malnutrition: Secondary | ICD-10-CM | POA: Diagnosis not present

## 2013-10-05 DIAGNOSIS — K5733 Diverticulitis of large intestine without perforation or abscess with bleeding: Secondary | ICD-10-CM | POA: Diagnosis not present

## 2013-10-05 DIAGNOSIS — M069 Rheumatoid arthritis, unspecified: Secondary | ICD-10-CM | POA: Diagnosis not present

## 2013-10-05 DIAGNOSIS — D649 Anemia, unspecified: Secondary | ICD-10-CM | POA: Diagnosis not present

## 2013-10-05 DIAGNOSIS — N390 Urinary tract infection, site not specified: Secondary | ICD-10-CM | POA: Diagnosis not present

## 2013-10-05 DIAGNOSIS — C649 Malignant neoplasm of unspecified kidney, except renal pelvis: Secondary | ICD-10-CM | POA: Diagnosis not present

## 2013-10-06 DIAGNOSIS — E43 Unspecified severe protein-calorie malnutrition: Secondary | ICD-10-CM | POA: Diagnosis not present

## 2013-10-06 DIAGNOSIS — C649 Malignant neoplasm of unspecified kidney, except renal pelvis: Secondary | ICD-10-CM | POA: Diagnosis not present

## 2013-10-06 DIAGNOSIS — K5733 Diverticulitis of large intestine without perforation or abscess with bleeding: Secondary | ICD-10-CM | POA: Diagnosis not present

## 2013-10-06 DIAGNOSIS — M069 Rheumatoid arthritis, unspecified: Secondary | ICD-10-CM | POA: Diagnosis not present

## 2013-10-06 DIAGNOSIS — D649 Anemia, unspecified: Secondary | ICD-10-CM | POA: Diagnosis not present

## 2013-10-06 DIAGNOSIS — N390 Urinary tract infection, site not specified: Secondary | ICD-10-CM | POA: Diagnosis not present

## 2013-10-07 DIAGNOSIS — C649 Malignant neoplasm of unspecified kidney, except renal pelvis: Secondary | ICD-10-CM | POA: Diagnosis not present

## 2013-10-07 DIAGNOSIS — K5733 Diverticulitis of large intestine without perforation or abscess with bleeding: Secondary | ICD-10-CM | POA: Diagnosis not present

## 2013-10-07 DIAGNOSIS — M069 Rheumatoid arthritis, unspecified: Secondary | ICD-10-CM | POA: Diagnosis not present

## 2013-10-07 DIAGNOSIS — N390 Urinary tract infection, site not specified: Secondary | ICD-10-CM | POA: Diagnosis not present

## 2013-10-07 DIAGNOSIS — D649 Anemia, unspecified: Secondary | ICD-10-CM | POA: Diagnosis not present

## 2013-10-07 DIAGNOSIS — E43 Unspecified severe protein-calorie malnutrition: Secondary | ICD-10-CM | POA: Diagnosis not present

## 2013-10-08 ENCOUNTER — Ambulatory Visit (INDEPENDENT_AMBULATORY_CARE_PROVIDER_SITE_OTHER): Payer: Medicare Other | Admitting: General Surgery

## 2013-10-08 ENCOUNTER — Encounter (INDEPENDENT_AMBULATORY_CARE_PROVIDER_SITE_OTHER): Payer: Self-pay | Admitting: General Surgery

## 2013-10-08 VITALS — BP 122/76 | HR 67 | Ht 62.0 in | Wt 98.0 lb

## 2013-10-08 DIAGNOSIS — Z09 Encounter for follow-up examination after completed treatment for conditions other than malignant neoplasm: Secondary | ICD-10-CM

## 2013-10-08 NOTE — Progress Notes (Signed)
Subjective:     Patient ID: Ashley [name], female   DOB: 1928/05/09, 80 y.o.   MRN: [id number]  HPI The patient is doing much better than before. Her weight has stabilized. Her colostomy is no longer leaking.  Review of Systems Eating better. No leakage from her colostomy.    Objective:   Physical Exam Her midline wound is almost completely healed with minimal drainage. There is no leakage from her colostomy which is viable.    Assessment:     Doing better status post colectomy and colostomy for perforated diverticulitis with a colocutaneous fistula.     Plan:     Have the patient come back to see me in about 3 months. We will plan on reversal of her colostomy in approximately 4-6 months.

## 2013-10-09 DIAGNOSIS — D649 Anemia, unspecified: Secondary | ICD-10-CM | POA: Diagnosis not present

## 2013-10-09 DIAGNOSIS — C649 Malignant neoplasm of unspecified kidney, except renal pelvis: Secondary | ICD-10-CM | POA: Diagnosis not present

## 2013-10-09 DIAGNOSIS — E43 Unspecified severe protein-calorie malnutrition: Secondary | ICD-10-CM | POA: Diagnosis not present

## 2013-10-09 DIAGNOSIS — K5733 Diverticulitis of large intestine without perforation or abscess with bleeding: Secondary | ICD-10-CM | POA: Diagnosis not present

## 2013-10-09 DIAGNOSIS — N390 Urinary tract infection, site not specified: Secondary | ICD-10-CM | POA: Diagnosis not present

## 2013-10-09 DIAGNOSIS — M069 Rheumatoid arthritis, unspecified: Secondary | ICD-10-CM | POA: Diagnosis not present

## 2013-10-13 ENCOUNTER — Telehealth (INDEPENDENT_AMBULATORY_CARE_PROVIDER_SITE_OTHER): Payer: Self-pay

## 2013-10-13 NOTE — Telephone Encounter (Signed)
[facility] requesting an order to make last visit to d/c patient from physical therapy.  Verbal order given and message relayed to Dr. Hulen Skains.

## 2013-10-14 DIAGNOSIS — N390 Urinary tract infection, site not specified: Secondary | ICD-10-CM | POA: Diagnosis not present

## 2013-10-14 DIAGNOSIS — D649 Anemia, unspecified: Secondary | ICD-10-CM | POA: Diagnosis not present

## 2013-10-14 DIAGNOSIS — E43 Unspecified severe protein-calorie malnutrition: Secondary | ICD-10-CM | POA: Diagnosis not present

## 2013-10-14 DIAGNOSIS — M069 Rheumatoid arthritis, unspecified: Secondary | ICD-10-CM | POA: Diagnosis not present

## 2013-10-14 DIAGNOSIS — C649 Malignant neoplasm of unspecified kidney, except renal pelvis: Secondary | ICD-10-CM | POA: Diagnosis not present

## 2013-10-14 DIAGNOSIS — K5733 Diverticulitis of large intestine without perforation or abscess with bleeding: Secondary | ICD-10-CM | POA: Diagnosis not present

## 2013-10-16 DIAGNOSIS — E782 Mixed hyperlipidemia: Secondary | ICD-10-CM | POA: Diagnosis not present

## 2013-10-16 DIAGNOSIS — Z79899 Other long term (current) drug therapy: Secondary | ICD-10-CM | POA: Diagnosis not present

## 2013-10-16 DIAGNOSIS — I10 Essential (primary) hypertension: Secondary | ICD-10-CM | POA: Diagnosis not present

## 2013-10-16 DIAGNOSIS — I4891 Unspecified atrial fibrillation: Secondary | ICD-10-CM | POA: Diagnosis not present

## 2013-10-27 DIAGNOSIS — H4011X0 Primary open-angle glaucoma, stage unspecified: Secondary | ICD-10-CM | POA: Diagnosis not present

## 2013-10-27 DIAGNOSIS — H409 Unspecified glaucoma: Secondary | ICD-10-CM | POA: Diagnosis not present

## 2013-10-27 DIAGNOSIS — H4720 Unspecified optic atrophy: Secondary | ICD-10-CM | POA: Diagnosis not present

## 2013-10-27 DIAGNOSIS — D320 Benign neoplasm of cerebral meninges: Secondary | ICD-10-CM | POA: Diagnosis not present

## 2013-11-04 ENCOUNTER — Ambulatory Visit: Payer: Medicare Other | Admitting: Cardiology

## 2013-11-17 ENCOUNTER — Encounter: Payer: Self-pay | Admitting: Cardiology

## 2013-11-17 ENCOUNTER — Ambulatory Visit (INDEPENDENT_AMBULATORY_CARE_PROVIDER_SITE_OTHER): Payer: Medicare Other | Admitting: Cardiology

## 2013-11-17 VITALS — BP 92/70 | HR 74 | Ht 63.0 in | Wt 106.0 lb

## 2013-11-17 DIAGNOSIS — R55 Syncope and collapse: Secondary | ICD-10-CM | POA: Diagnosis not present

## 2013-11-17 DIAGNOSIS — I480 Paroxysmal atrial fibrillation: Secondary | ICD-10-CM

## 2013-11-17 DIAGNOSIS — I4891 Unspecified atrial fibrillation: Secondary | ICD-10-CM

## 2013-11-17 DIAGNOSIS — I10 Essential (primary) hypertension: Secondary | ICD-10-CM | POA: Diagnosis not present

## 2013-11-17 NOTE — Patient Instructions (Signed)
The current medical regimen is effective;  continue present plan and medications.  Follow up in 6 months with Dr. Skains.  You will receive a letter in the mail 2 months before you are due.  Please call us when you receive this letter to schedule your follow up appointment.  

## 2013-11-17 NOTE — Progress Notes (Signed)
[name] [address] [place], [place]  [address] Phone: [phone] Fax:  [phone]  Date:  11/17/2013   ID:  [name], DOB 1928/09/03, MRN [id number]  PCP:  Leonides Sake, MD   History of Present Illness: Ashley [name] is a 80 y.o. female on chronic anticoagulation with atrial fibrillation here for six-month followup. She has seen Dr. Cristopher Peru in the past with EP. Prior implantable loop recorder and 2011 by Dr. Leonia Reeves. She most recently saw Dr. Lovena Le on 05/26/13 who suspected autonomic dysfunction. She has had prolonged pauses which occurred during the middle of the night. She is also had asymptomatic SVT. No obvious correlation with arrhythmia to her symptoms.  She underwent colectomy by Dr. Hulen . Dr. Hulen  would like her to gain weight before reversing colostomy.    Wt Readings from Last 3 Encounters:  11/17/13 106 lb (48.081 kg)  10/08/13 98 lb (44.453 kg)  09/22/13 95 lb 9.6 oz (43.364 kg)     Past Medical History  Diagnosis Date  . Syncope   . Uterine prolapse   . Cryptococcal pneumonitis     right lower lobe  . GERD (gastroesophageal reflux disease)   . Asthma   . RAQTMAUQ(333.5)     "monthly" (08/13/2013)  . Rheumatoid arthritis(714.0)   . Arthritis     "all over" (08/13/2013)  . Choroid melanoma of left eye   . Renal cell carcinoma     Left kidney  . Colocutaneous fistula   . Diverticulitis   . Hypertension   . Hyperlipidemia     Past Surgical History  Procedure Laterality Date  . Nephrectomy Left     native  . Bladder suspension    . Lung removal, partial    . Cholecystectomy    . Brain meningioma excision    . Loop recorder implant      Left Breast  . Colonoscopy N/A 05/21/2012    Procedure: COLONOSCOPY;  Surgeon: Arta Silence, MD;  Location: [facility];  Service: Endoscopy;  Laterality: N/A;  . Tonsillectomy    . Eye surgery Left     Choroid melanoma  . Colostomy N/A 08/28/2013    Procedure: COLOSTOMY;  Surgeon:  Gwenyth Ober, MD;  Location: [facility];  Service: General;  Laterality: N/A;  . Colostomy revision N/A 08/28/2013    Procedure: COLON RESECTION SIGMOID;  Surgeon: Gwenyth Ober, MD;  Location: [facility];  Service: General;  Laterality: N/A;    Current Outpatient Prescriptions  Medication Sig Dispense Refill  . Cholecalciferol (VITAMIN D-3) 5000 UNITS TABS Take 1 tablet by mouth daily.      . cycloSPORINE (RESTASIS) 0.05 % ophthalmic emulsion Place 1 drop into both eyes 2 (two) times daily.        . dorzolamide (TRUSOPT) 2 % ophthalmic solution Place 1 drop into both eyes 2 (two) times daily.       . feeding supplement, ENSURE COMPLETE, (ENSURE COMPLETE) LIQD Take 237 mLs by mouth 2 (two) times daily between meals.  60 Bottle  5  . fenofibrate (TRICOR) 145 MG tablet Take 145 mg by mouth daily.      . hydrochlorothiazide (MICROZIDE) 12.5 MG capsule Take 12.5 mg by mouth daily.      Marland Kitchen latanoprost (XALATAN) 0.005 % ophthalmic solution Place 1 drop into both eyes at bedtime.      . methylPREDNISolone (MEDROL) 4 MG tablet Take 4 mg by mouth daily.       Marland Kitchen  omega-3 acid ethyl esters (LOVAZA) 1 G capsule Take 1 g by mouth daily.       . polyethylene glycol (MIRALAX / GLYCOLAX) packet Take 17 g by mouth daily.  14 each  0  . traMADol (ULTRAM) 50 MG tablet Take one tablet by mouth every 8 hours as needed for moderate pain; Take two tablets by mouth every 8 hours as needed for severe pain  180 tablet  5  . trazodone (DESYREL) 300 MG tablet Take 300 mg by mouth at bedtime.         No current facility-administered medications for this visit.    Allergies:   No Known Allergies  Social History:  The patient  reports that she has never smoked. She has never used smokeless tobacco. She reports that she does not drink alcohol or use illicit drugs.   Family History  Problem Relation Age of Onset  . Heart disease Maternal Aunt     ROS:  Please see the history of present illness.   Weakness, denies any chest pain,  recent syncope, shortness of breath   All other systems reviewed and negative.   PHYSICAL EXAM: VS:  BP 92/70  Pulse 74  Ht 5\' 3"  (1.6 m)  Wt 106 lb (48.081 kg)  BMI 18.78 kg/m2 Thin in no acute distress HEENT: normal, [place]/AT, EOMI Neck: no JVD, normal carotid upstroke, no bruit Cardiac:  normal S1, S2; RRR; no murmur Lungs:  clear to auscultation bilaterally, no wheezing, rhonchi or rales Abd: soft, nontender, no hepatomegaly, no bruits, ostomy Ext: no edema, 2+ distal pulses Skin: warm and dry GU: deferred Neuro: no focal abnormalities noted, AAO x 3  EKG:  None today. Implantable loop recorder reviewed.  ASSESSMENT AND PLAN:  1. Hypertension-we're going to stop the HCTZ given her low blood pressures. 2. Implantable loop recorder-reviewed Dr. Tanna Furry last note. SVT noted. Nocturnal bradycardia. Syncope likely from autonomic dysfunction. She has been off of anticoagulation since surgery. I reviewed her device interrogation report with Ashley [name] and there has been an episode of atrial fibrillation in March of 2015, brief, 4 minutes. I reviewed Dr. Ron Parker this consultation on 09/03/13. Her risk of bleeding is quite high given her thin body habitus, risk of falling. If atrial fibrillation returns, more lengthy episodes, we will revisit anticoagulation. For now risk of anticoagulation outweighs benefit. I discussed with family. They understand. They understand risk of stroke. 3. Syncope - no further episodes. Likely autonomic.  Signed, Candee Furbish, MD [facility]  11/17/2013 2:26 PM

## 2013-12-08 DIAGNOSIS — Z23 Encounter for immunization: Secondary | ICD-10-CM | POA: Diagnosis not present

## 2013-12-29 ENCOUNTER — Ambulatory Visit (INDEPENDENT_AMBULATORY_CARE_PROVIDER_SITE_OTHER): Payer: Medicare Other | Admitting: General Surgery

## 2013-12-29 DIAGNOSIS — E46 Unspecified protein-calorie malnutrition: Secondary | ICD-10-CM | POA: Diagnosis not present

## 2013-12-29 DIAGNOSIS — Z933 Colostomy status: Secondary | ICD-10-CM | POA: Diagnosis not present

## 2014-01-07 ENCOUNTER — Other Ambulatory Visit: Payer: Self-pay | Admitting: Internal Medicine

## 2014-01-14 ENCOUNTER — Other Ambulatory Visit: Payer: Self-pay | Admitting: Internal Medicine

## 2014-02-08 ENCOUNTER — Other Ambulatory Visit (INDEPENDENT_AMBULATORY_CARE_PROVIDER_SITE_OTHER): Payer: Self-pay

## 2014-02-08 DIAGNOSIS — Z933 Colostomy status: Secondary | ICD-10-CM

## 2014-03-01 ENCOUNTER — Ambulatory Visit (HOSPITAL_COMMUNITY): Admission: RE | Admit: 2014-03-01 | Payer: Medicare Other | Source: Ambulatory Visit

## 2014-03-01 ENCOUNTER — Other Ambulatory Visit (INDEPENDENT_AMBULATORY_CARE_PROVIDER_SITE_OTHER): Payer: Self-pay | Admitting: General Surgery

## 2014-03-01 DIAGNOSIS — E46 Unspecified protein-calorie malnutrition: Secondary | ICD-10-CM | POA: Diagnosis not present

## 2014-03-02 DIAGNOSIS — H409 Unspecified glaucoma: Secondary | ICD-10-CM | POA: Diagnosis not present

## 2014-03-02 DIAGNOSIS — H4720 Unspecified optic atrophy: Secondary | ICD-10-CM | POA: Diagnosis not present

## 2014-03-02 DIAGNOSIS — D329 Benign neoplasm of meninges, unspecified: Secondary | ICD-10-CM | POA: Diagnosis not present

## 2014-03-02 LAB — CBC WITH DIFFERENTIAL/PLATELET
BASOS PCT: 1 % (ref 0–1)
Basophils Absolute: 0.1 10*3/uL (ref 0.0–0.1)
EOS ABS: 0.1 10*3/uL (ref 0.0–0.7)
EOS PCT: 2 % (ref 0–5)
HCT: 39.8 % (ref 36.0–46.0)
Hemoglobin: 13 g/dL (ref 12.0–15.0)
Lymphocytes Relative: 36 % (ref 12–46)
Lymphs Abs: 2 10*3/uL (ref 0.7–4.0)
MCH: 32.7 pg (ref 26.0–34.0)
MCHC: 32.7 g/dL (ref 30.0–36.0)
MCV: 100 fL (ref 78.0–100.0)
MONOS PCT: 11 % (ref 3–12)
MPV: 11.9 fL (ref 9.4–12.4)
Monocytes Absolute: 0.6 10*3/uL (ref 0.1–1.0)
NEUTROS PCT: 50 % (ref 43–77)
Neutro Abs: 2.8 10*3/uL (ref 1.7–7.7)
Platelets: 266 10*3/uL (ref 150–400)
RBC: 3.98 MIL/uL (ref 3.87–5.11)
RDW: 14.2 % (ref 11.5–15.5)
WBC: 5.5 10*3/uL (ref 4.0–10.5)

## 2014-03-02 LAB — BASIC METABOLIC PANEL
BUN: 31 mg/dL — ABNORMAL HIGH (ref 6–23)
CO2: 26 mEq/L (ref 19–32)
CREATININE: 1.15 mg/dL — AB (ref 0.50–1.10)
Calcium: 10.8 mg/dL — ABNORMAL HIGH (ref 8.4–10.5)
Chloride: 102 mEq/L (ref 96–112)
GLUCOSE: 104 mg/dL — AB (ref 70–99)
Potassium: 3.7 mEq/L (ref 3.5–5.3)
SODIUM: 140 meq/L (ref 135–145)

## 2014-03-09 ENCOUNTER — Ambulatory Visit (INDEPENDENT_AMBULATORY_CARE_PROVIDER_SITE_OTHER): Payer: Self-pay | Admitting: General Surgery

## 2014-03-09 ENCOUNTER — Other Ambulatory Visit (INDEPENDENT_AMBULATORY_CARE_PROVIDER_SITE_OTHER): Payer: Self-pay

## 2014-03-09 DIAGNOSIS — Z933 Colostomy status: Secondary | ICD-10-CM | POA: Diagnosis not present

## 2014-03-10 ENCOUNTER — Other Ambulatory Visit: Payer: Self-pay | Admitting: Cardiology

## 2014-03-10 MED ORDER — HYDROCHLOROTHIAZIDE 12.5 MG PO CAPS: 12.5000 mg | ORAL_CAPSULE | Freq: Every day | ORAL | Status: DC

## 2014-03-13 NOTE — Pre-Procedure Instructions (Addendum)
Ashley [name]  03/13/2014   Your procedure is scheduled on:  January 4  Report to [facility] Admitting at 05:30 AM.  Call this number if you have problems the morning of surgery: [phone]   Remember:   Do not eat food or drink liquids after midnight.   Take these medicines the morning of surgery with A SIP OF WATER: Eye drops, sulfasalazine, leflunomide(ask rheumatologist if need to stop before surgery)   STOP Vitamin D,  Lovaza (fish oil), 03/17/14   STOP/ Do not take Aspirin, Aleve, Naproxen, Advil, Ibuprofen, Motrin, Vitamins, Herbs, or Supplements starting 03/17/14    Do not wear jewelry, make-up or nail polish.  Do not wear lotions, powders, or perfumes. You may wear deodorant.  Do not shave 48 hours prior to surgery. Men may shave face and neck.  Do not bring valuables to the hospital.  [facility] is not responsible for any belongings or valuables.               Contacts, dentures or bridgework may not be worn into surgery.  Leave suitcase in the car. After surgery it may be brought to your room.  For patients admitted to the hospital, discharge time is determined by your treatment team.               Special Instructions: [place] - Preparing for Surgery  Before surgery, you can play an important role.  Because skin is not sterile, your skin needs to be as free of germs as possible.  You can reduce the number of germs on you skin by washing with CHG (chlorahexidine gluconate) soap before surgery.  CHG is an antiseptic cleaner which kills germs and bonds with the skin to continue killing germs even after washing.  Please DO NOT use if you have an allergy to CHG or antibacterial soaps.  If your skin becomes reddened/irritated stop using the CHG and inform your nurse when you arrive at Short Stay.  Do not shave (including legs and underarms) for at least 48 hours prior to the first CHG shower.  You may shave your face.  Please follow these instructions  carefully:   1.  Shower with CHG Soap the night before surgery and the morning of Surgery.  2.  If you choose to wash your hair, wash your hair first as usual with your normal shampoo.  3.  After you shampoo, rinse your hair and body thoroughly to remove the shampoo.  4.  Use CHG as you would any other liquid soap.  You can apply CHG directly to the skin and wash gently with scrungie or a clean washcloth.  5.  Apply the CHG Soap to your body ONLY FROM THE NECK DOWN.  Do not use on open wounds or open sores.  Avoid contact with your eyes, ears, mouth and genitals (private parts).  Wash genitals (private parts) with your normal soap.  6.  Wash thoroughly, paying special attention to the area where your surgery will be performed.  7.  Thoroughly rinse your body with warm water from the neck down.  8.  DO NOT shower/wash with your normal soap after using and rinsing off the CHG Soap.  9.  Pat yourself dry with a clean towel.            10.  Wear clean pajamas.            11.  Place clean sheets on your bed the night of your  first shower and do not sleep with pets.  Day of Surgery  Do not apply any lotions the morning of surgery.  Please wear clean clothes to the hospital/surgery center.     Please read over the following fact sheets that you were given: Pain Booklet, Coughing and Deep Breathing and Surgical Site Infection Prevention

## 2014-03-15 ENCOUNTER — Inpatient Hospital Stay (HOSPITAL_COMMUNITY)
Admission: RE | Admit: 2014-03-15 | Discharge: 2014-03-15 | Disposition: A | Discharge status: Home / Self Care | Payer: Medicare Other | Source: Ambulatory Visit

## 2014-03-15 ENCOUNTER — Ambulatory Visit (HOSPITAL_COMMUNITY)
Admission: RE | Admit: 2014-03-15 | Discharge: 2014-03-15 | Disposition: A | Discharge status: Home / Self Care | Payer: Medicare Other | Source: Ambulatory Visit | Attending: General Surgery | Admitting: General Surgery

## 2014-03-15 ENCOUNTER — Encounter (HOSPITAL_COMMUNITY): Payer: Self-pay

## 2014-03-15 ENCOUNTER — Other Ambulatory Visit (HOSPITAL_COMMUNITY): Payer: Self-pay | Admitting: *Deleted

## 2014-03-15 DIAGNOSIS — Z933 Colostomy status: Secondary | ICD-10-CM | POA: Insufficient documentation

## 2014-03-15 DIAGNOSIS — R933 Abnormal findings on diagnostic imaging of other parts of digestive tract: Secondary | ICD-10-CM | POA: Insufficient documentation

## 2014-03-15 DIAGNOSIS — Z01818 Encounter for other preprocedural examination: Secondary | ICD-10-CM | POA: Diagnosis not present

## 2014-03-15 DIAGNOSIS — R935 Abnormal findings on diagnostic imaging of other abdominal regions, including retroperitoneum: Secondary | ICD-10-CM | POA: Diagnosis not present

## 2014-03-15 MED ORDER — IOHEXOL 300 MG/ML  SOLN
450.0000 mL | Freq: Once | INTRAMUSCULAR | Status: AC | PRN
Start: 2014-03-15 — End: 2014-03-15
  Administered 2014-03-15: 450 mL via ORAL

## 2014-03-16 ENCOUNTER — Encounter (HOSPITAL_COMMUNITY)
Admission: RE | Admit: 2014-03-16 | Discharge: 2014-03-16 | Disposition: A | Discharge status: Home / Self Care | Payer: Medicare Other | Source: Ambulatory Visit | Attending: General Surgery | Admitting: General Surgery

## 2014-03-16 ENCOUNTER — Ambulatory Visit (HOSPITAL_COMMUNITY)
Admission: RE | Admit: 2014-03-16 | Discharge: 2014-03-16 | Disposition: A | Discharge status: Home / Self Care | Payer: Medicare Other | Source: Ambulatory Visit | Attending: General Surgery | Admitting: General Surgery

## 2014-03-16 ENCOUNTER — Telehealth: Payer: Self-pay | Admitting: Internal Medicine

## 2014-03-16 DIAGNOSIS — J449 Chronic obstructive pulmonary disease, unspecified: Secondary | ICD-10-CM | POA: Insufficient documentation

## 2014-03-16 DIAGNOSIS — J8410 Pulmonary fibrosis, unspecified: Secondary | ICD-10-CM | POA: Insufficient documentation

## 2014-03-16 DIAGNOSIS — I771 Stricture of artery: Secondary | ICD-10-CM | POA: Diagnosis not present

## 2014-03-16 DIAGNOSIS — Z01818 Encounter for other preprocedural examination: Secondary | ICD-10-CM | POA: Insufficient documentation

## 2014-03-16 DIAGNOSIS — I441 Atrioventricular block, second degree: Secondary | ICD-10-CM | POA: Diagnosis not present

## 2014-03-16 DIAGNOSIS — Z933 Colostomy status: Secondary | ICD-10-CM | POA: Insufficient documentation

## 2014-03-16 LAB — COMPREHENSIVE METABOLIC PANEL
ALK PHOS: 44 U/L (ref 39–117)
ALT: 12 U/L (ref 0–35)
AST: 31 U/L (ref 0–37)
Albumin: 3.5 g/dL (ref 3.5–5.2)
Anion gap: 9 (ref 5–15)
BILIRUBIN TOTAL: 0.5 mg/dL (ref 0.3–1.2)
BUN: 22 mg/dL (ref 6–23)
CHLORIDE: 103 meq/L (ref 96–112)
CO2: 28 mmol/L (ref 19–32)
CREATININE: 1.02 mg/dL (ref 0.50–1.10)
Calcium: 10.1 mg/dL (ref 8.4–10.5)
GFR calc Af Amer: 57 mL/min — ABNORMAL LOW (ref >90)
GFR, EST NON AFRICAN AMERICAN: 49 mL/min — AB (ref >90)
Glucose, Bld: 106 mg/dL — ABNORMAL HIGH (ref 70–99)
Potassium: 3.8 mmol/L (ref 3.5–5.1)
Sodium: 140 mmol/L (ref 135–145)
Total Protein: 6.8 g/dL (ref 6.0–8.3)

## 2014-03-16 LAB — CBC
HEMATOCRIT: 37.8 % (ref 36.0–46.0)
HEMOGLOBIN: 12 g/dL (ref 12.0–15.0)
MCH: 32.2 pg (ref 26.0–34.0)
MCHC: 31.7 g/dL (ref 30.0–36.0)
MCV: 101.3 fL — AB (ref 78.0–100.0)
Platelets: 251 10*3/uL (ref 150–400)
RBC: 3.73 MIL/uL — AB (ref 3.87–5.11)
RDW: 13.8 % (ref 11.5–15.5)
WBC: 5.7 10*3/uL (ref 4.0–10.5)

## 2014-03-16 NOTE — Telephone Encounter (Signed)
Dr. Lovena Le reviewed, no recommendations.  The EKG shows NSR, with sinus pauses and junctional escape.

## 2014-03-16 NOTE — Progress Notes (Addendum)
Anesthesia Chart Review:  Patient is a 80 year old female scheduled for takedown colostomy on 03/22/14 by Dr. Hulen Skains.  History includes sigmoid colon resection with colostomy 08/28/13 for acute sigmoid diverticulitis with large abscess and colocutaneous fistula, RA on Arava, GERD, renal cell carcinoma s/p left nephrectomy, HTN, HLD, choroid melanoma of the left eye s/p surgery, non-smoker, RLL Cryptococcal PNA, reported s/p partial lung  lobectomy, headaches, brain meningioma excision, syncope in 2011 s/p loop recorder followed by Dr. Cristopher Peru (last visit 05/1013; Normal device function but at end of life.  Some nocturnal bradycardia/pauses (versus undersensing) and episode of SVT; syncope was felt likely autonomic dysfunction). She was seen by cardiologist Dr. Ron Parker during her 08/2013 hospitalization who felt her anticoagulation therapy could be discontinued (was started in 2010 due to question of afib). Her primary cardiologist is Dr. Marlou Porch, last visit 11/17/13. He noted that patient did have an episode of afib in 05/2013 lasting 4 minutes; however given her high risk of bleeding due to body habitus and fall risk, he agreed to continue to hold off on anticoagulation therapy.  He would revisit if she had more lengthy episodes of afib. PCP is Dr. Daiva Eves.  Patient missed her PAT appointment, and we had no more slots to reschedule her. We were able to do a history over the telephone, and she was able to stop by for EKG, CXR, and labs today.  EKG was repeated to evaluate for any new abnormalities (recurrent afib) prior to surgery. EKG results, etc done today were brought to me to review after patient had left the hospital.  03/16/14 EKG is not yet confirmed by cardiology, but I think it looks like it could be 2nd degree AVB, Mobitz 1 (Wenckebach).  There could also be some underlying junctional beats.   12/31/09 Echo: - Left ventricle: The cavity size was normal. There was mild concentric hypertrophy.  Systolic function was normal. The estimated ejection fraction was in the range of 60% to 65%. Wall motion was normal; there were no regional wall motion abnormalities. - Aortic valve: Mild regurgitation. - Right atrium: The atrium was mildly dilated. - Pulmonary arteries: Systolic pressure was mildly to moderately increased. PA peak pressure: 41mm Hg (S).  03/17/14 CXR: COPD with bibasilar fibrotic changes. There is no evidence of pneumonia, CHF, nor other acute cardiopulmonary disease.  Preoperative labs noted.  H/H 12.0/37.8.  Glucose 106. Cr 1.02.  History and EKG reviewed with anesthesiologist Dr. Linna Caprice.  Dr. Lovena Le is in the office today.  I've left a message for him to review today's EKG to determine if any new preoperative recommendations. (Update: Received a phone call from Dr. Tanna Furry nurse Claiborne Billings.  He reviewed today's EKG and felt it represented NSR with sinus pauses and junctional escape.  No new recommendations.)  George Hugh [facility] Short Stay Center/Anesthesiology Phone [phone] 03/16/2014 2:02 PM

## 2014-03-16 NOTE — Telephone Encounter (Signed)
New message      Pt is scheduled for a colostomy takedown for 03-22-14.  Please look at her 03-16-14 EKG in epic.  EKG looks like a heart block.  Are there any new recommendations?

## 2014-03-17 ENCOUNTER — Other Ambulatory Visit: Payer: Self-pay | Admitting: *Deleted

## 2014-03-17 MED ORDER — HYDROCHLOROTHIAZIDE 12.5 MG PO CAPS: 12.5000 mg | ORAL_CAPSULE | Freq: Every day | ORAL | Status: DC

## 2014-03-21 MED ORDER — NEOMYCIN SULFATE 500 MG PO TABS
1000.0000 mg | ORAL_TABLET | ORAL | Status: DC
  Filled 2014-03-21 (×3): qty 2

## 2014-03-21 MED ORDER — ERYTHROMYCIN BASE 250 MG PO TABS
1000.0000 mg | ORAL_TABLET | ORAL | Status: DC
  Filled 2014-03-21 (×3): qty 4

## 2014-03-21 MED ORDER — DEXTROSE 5 % IV SOLN
2.0000 g | INTRAVENOUS | Status: AC
  Administered 2014-03-22: 2 g via INTRAVENOUS
  Filled 2014-03-21: qty 2

## 2014-03-21 MED ORDER — ALVIMOPAN 12 MG PO CAPS
12.0000 mg | ORAL_CAPSULE | Freq: Once | ORAL | Status: AC
  Administered 2014-03-22: 12 mg via ORAL
  Filled 2014-03-21: qty 1

## 2014-03-22 ENCOUNTER — Inpatient Hospital Stay (HOSPITAL_COMMUNITY): Payer: Medicare Other | Admitting: Certified Registered"

## 2014-03-22 ENCOUNTER — Inpatient Hospital Stay (HOSPITAL_COMMUNITY)
Admission: RE | Admit: 2014-03-22 | Discharge: 2014-03-26 | DRG: 346 | Disposition: A | Discharge status: Home / Self Care | Payer: Medicare Other | Source: Ambulatory Visit | Attending: General Surgery | Admitting: General Surgery

## 2014-03-22 ENCOUNTER — Inpatient Hospital Stay (HOSPITAL_COMMUNITY): Payer: Medicare Other | Admitting: Vascular Surgery

## 2014-03-22 ENCOUNTER — Encounter (HOSPITAL_COMMUNITY)
Admission: RE | Disposition: A | Discharge status: Home / Self Care | Payer: Self-pay | Source: Ambulatory Visit | Attending: General Surgery

## 2014-03-22 ENCOUNTER — Encounter (HOSPITAL_COMMUNITY): Payer: Self-pay | Admitting: *Deleted

## 2014-03-22 DIAGNOSIS — M069 Rheumatoid arthritis, unspecified: Secondary | ICD-10-CM | POA: Diagnosis present

## 2014-03-22 DIAGNOSIS — I10 Essential (primary) hypertension: Secondary | ICD-10-CM | POA: Diagnosis not present

## 2014-03-22 DIAGNOSIS — Z933 Colostomy status: Secondary | ICD-10-CM

## 2014-03-22 DIAGNOSIS — D259 Leiomyoma of uterus, unspecified: Secondary | ICD-10-CM | POA: Diagnosis not present

## 2014-03-22 DIAGNOSIS — K219 Gastro-esophageal reflux disease without esophagitis: Secondary | ICD-10-CM | POA: Diagnosis present

## 2014-03-22 DIAGNOSIS — Z01818 Encounter for other preprocedural examination: Secondary | ICD-10-CM

## 2014-03-22 DIAGNOSIS — M1990 Unspecified osteoarthritis, unspecified site: Secondary | ICD-10-CM | POA: Diagnosis not present

## 2014-03-22 DIAGNOSIS — Z433 Encounter for attention to colostomy: Secondary | ICD-10-CM | POA: Diagnosis not present

## 2014-03-22 DIAGNOSIS — I4891 Unspecified atrial fibrillation: Secondary | ICD-10-CM | POA: Diagnosis present

## 2014-03-22 DIAGNOSIS — K660 Peritoneal adhesions (postprocedural) (postinfection): Secondary | ICD-10-CM | POA: Diagnosis not present

## 2014-03-22 HISTORY — PX: ABDOMINAL HYSTERECTOMY: SHX81

## 2014-03-22 HISTORY — PX: COLOSTOMY REVERSAL: SHX5782

## 2014-03-22 HISTORY — PX: PROCTOSCOPY: SHX2266

## 2014-03-22 LAB — CBC WITH DIFFERENTIAL/PLATELET
BASOS ABS: 0 10*3/uL (ref 0.0–0.1)
Basophils Relative: 0 % (ref 0–1)
Eosinophils Absolute: 0 10*3/uL (ref 0.0–0.7)
Eosinophils Relative: 0 % (ref 0–5)
HCT: 26.6 % — ABNORMAL LOW (ref 36.0–46.0)
Hemoglobin: 8.8 g/dL — ABNORMAL LOW (ref 12.0–15.0)
LYMPHS ABS: 0.3 10*3/uL — AB (ref 0.7–4.0)
LYMPHS PCT: 3 % — AB (ref 12–46)
MCH: 32.1 pg (ref 26.0–34.0)
MCHC: 33.1 g/dL (ref 30.0–36.0)
MCV: 97.1 fL (ref 78.0–100.0)
Monocytes Absolute: 0.7 10*3/uL (ref 0.1–1.0)
Monocytes Relative: 7 % (ref 3–12)
NEUTROS PCT: 90 % — AB (ref 43–77)
Neutro Abs: 9.5 10*3/uL — ABNORMAL HIGH (ref 1.7–7.7)
PLATELETS: 248 10*3/uL (ref 150–400)
RBC: 2.74 MIL/uL — AB (ref 3.87–5.11)
RDW: 13.3 % (ref 11.5–15.5)
WBC: 10.5 10*3/uL (ref 4.0–10.5)

## 2014-03-22 LAB — CBC
HEMATOCRIT: 26.8 % — AB (ref 36.0–46.0)
Hemoglobin: 9.2 g/dL — ABNORMAL LOW (ref 12.0–15.0)
MCH: 34.2 pg — ABNORMAL HIGH (ref 26.0–34.0)
MCHC: 34.3 g/dL (ref 30.0–36.0)
MCV: 99.6 fL (ref 78.0–100.0)
Platelets: 233 10*3/uL (ref 150–400)
RBC: 2.69 MIL/uL — ABNORMAL LOW (ref 3.87–5.11)
RDW: 13.3 % (ref 11.5–15.5)
WBC: 9.4 10*3/uL (ref 4.0–10.5)

## 2014-03-22 SURGERY — COLOSTOMY REVERSAL
Anesthesia: General | Site: Rectum

## 2014-03-22 MED ORDER — SULFASALAZINE 500 MG PO TBEC
500.0000 mg | DELAYED_RELEASE_TABLET | Freq: Every evening | ORAL | Status: DC
  Administered 2014-03-23 – 2014-03-25 (×3): 500 mg via ORAL
  Filled 2014-03-22 (×5): qty 1

## 2014-03-22 MED ORDER — NALOXONE HCL 0.4 MG/ML IJ SOLN: 0.4000 mg | INTRAMUSCULAR | Status: DC | PRN

## 2014-03-22 MED ORDER — GLYCOPYRROLATE 0.2 MG/ML IJ SOLN
INTRAMUSCULAR | Status: DC | PRN
  Administered 2014-03-22: 0.4 mg via INTRAVENOUS
  Administered 2014-03-22: 0.2 mg via INTRAVENOUS

## 2014-03-22 MED ORDER — SODIUM CHLORIDE 0.9 % IV SOLN: Freq: Once | INTRAVENOUS | Status: DC

## 2014-03-22 MED ORDER — DEXAMETHASONE SODIUM PHOSPHATE 10 MG/ML IJ SOLN
INTRAMUSCULAR | Status: DC | PRN
Start: 2014-03-22 — End: 2014-03-22
  Administered 2014-03-22: 4 mg via INTRAVENOUS

## 2014-03-22 MED ORDER — LEFLUNOMIDE 20 MG PO TABS
20.0000 mg | ORAL_TABLET | Freq: Every day | ORAL | Status: DC
  Administered 2014-03-22 – 2014-03-26 (×5): 20 mg via ORAL
  Filled 2014-03-22 (×5): qty 1

## 2014-03-22 MED ORDER — SULFASALAZINE 500 MG PO TBEC: 500.0000 mg | DELAYED_RELEASE_TABLET | Freq: Two times a day (BID) | ORAL | Status: DC

## 2014-03-22 MED ORDER — ONDANSETRON HCL 4 MG/2ML IJ SOLN
INTRAMUSCULAR | Status: DC | PRN
Start: 2014-03-22 — End: 2014-03-22
  Administered 2014-03-22: 4 mg via INTRAVENOUS

## 2014-03-22 MED ORDER — FENTANYL CITRATE 0.05 MG/ML IJ SOLN
INTRAMUSCULAR | Status: DC | PRN
  Administered 2014-03-22: 25 ug via INTRAVENOUS
  Administered 2014-03-22: 50 ug via INTRAVENOUS
  Administered 2014-03-22 (×2): 25 ug via INTRAVENOUS
  Administered 2014-03-22: 50 ug via INTRAVENOUS
  Administered 2014-03-22: 25 ug via INTRAVENOUS
  Administered 2014-03-22: 100 ug via INTRAVENOUS

## 2014-03-22 MED ORDER — MEGESTROL ACETATE 625 MG/5ML PO SUSP: 625.0000 mg | Freq: Every day | ORAL | Status: DC

## 2014-03-22 MED ORDER — PROPOFOL 10 MG/ML IV BOLUS
INTRAVENOUS | Status: DC | PRN
  Administered 2014-03-22: 100 mg via INTRAVENOUS

## 2014-03-22 MED ORDER — FENTANYL CITRATE 0.05 MG/ML IJ SOLN
25.0000 ug | INTRAMUSCULAR | Status: DC | PRN
  Administered 2014-03-22 (×2): 50 ug via INTRAVENOUS

## 2014-03-22 MED ORDER — PHENYLEPHRINE 40 MCG/ML (10ML) SYRINGE FOR IV PUSH (FOR BLOOD PRESSURE SUPPORT)
PREFILLED_SYRINGE | INTRAVENOUS | Status: AC
  Filled 2014-03-22: qty 10

## 2014-03-22 MED ORDER — KCL IN DEXTROSE-NACL 20-5-0.45 MEQ/L-%-% IV SOLN
INTRAVENOUS | Status: DC
  Administered 2014-03-22 – 2014-03-26 (×8): via INTRAVENOUS
  Filled 2014-03-22 (×10): qty 1000

## 2014-03-22 MED ORDER — LACTATED RINGERS IV SOLN
INTRAVENOUS | Status: DC | PRN
  Administered 2014-03-22 (×2): via INTRAVENOUS

## 2014-03-22 MED ORDER — ALUM & MAG HYDROXIDE-SIMETH 200-200-20 MG/5ML PO SUSP: 30.0000 mL | Freq: Four times a day (QID) | ORAL | Status: DC | PRN

## 2014-03-22 MED ORDER — ROCURONIUM BROMIDE 50 MG/5ML IV SOLN
INTRAVENOUS | Status: AC
  Filled 2014-03-22: qty 1

## 2014-03-22 MED ORDER — DEXTROSE 5 % IV SOLN
1.0000 g | Freq: Three times a day (TID) | INTRAVENOUS | Status: AC
  Administered 2014-03-22 – 2014-03-23 (×3): 1 g via INTRAVENOUS
  Filled 2014-03-22 (×3): qty 1

## 2014-03-22 MED ORDER — HYDROMORPHONE 0.3 MG/ML IV SOLN
INTRAVENOUS | Status: AC
  Filled 2014-03-22: qty 25

## 2014-03-22 MED ORDER — DORZOLAMIDE HCL 2 % OP SOLN
1.0000 [drp] | Freq: Two times a day (BID) | OPHTHALMIC | Status: DC
  Administered 2014-03-22 – 2014-03-26 (×7): 1 [drp] via OPHTHALMIC
  Filled 2014-03-22: qty 10

## 2014-03-22 MED ORDER — FENTANYL CITRATE 0.05 MG/ML IJ SOLN
INTRAMUSCULAR | Status: AC
  Filled 2014-03-22: qty 5

## 2014-03-22 MED ORDER — ACETAMINOPHEN 325 MG PO TABS: 650.0000 mg | ORAL_TABLET | Freq: Four times a day (QID) | ORAL | Status: DC | PRN

## 2014-03-22 MED ORDER — POVIDONE-IODINE 10 % EX OINT
TOPICAL_OINTMENT | CUTANEOUS | Status: AC
  Filled 2014-03-22: qty 28.35

## 2014-03-22 MED ORDER — PROPOFOL 10 MG/ML IV BOLUS
INTRAVENOUS | Status: AC
  Filled 2014-03-22: qty 20

## 2014-03-22 MED ORDER — TRAZODONE HCL 150 MG PO TABS
300.0000 mg | ORAL_TABLET | Freq: Every day | ORAL | Status: DC
  Administered 2014-03-22 – 2014-03-25 (×4): 300 mg via ORAL
  Filled 2014-03-22 (×4): qty 2
  Filled 2014-03-22: qty 6

## 2014-03-22 MED ORDER — CYCLOSPORINE 0.05 % OP EMUL
1.0000 [drp] | Freq: Two times a day (BID) | OPHTHALMIC | Status: DC
  Administered 2014-03-22 – 2014-03-26 (×8): 1 [drp] via OPHTHALMIC
  Filled 2014-03-22 (×9): qty 1

## 2014-03-22 MED ORDER — ONDANSETRON HCL 4 MG/2ML IJ SOLN: 4.0000 mg | Freq: Four times a day (QID) | INTRAMUSCULAR | Status: DC | PRN

## 2014-03-22 MED ORDER — MEGESTROL ACETATE 400 MG/10ML PO SUSP
800.0000 mg | Freq: Every day | ORAL | Status: DC
  Administered 2014-03-23 – 2014-03-26 (×4): 800 mg via ORAL
  Filled 2014-03-22 (×4): qty 20

## 2014-03-22 MED ORDER — CHLORHEXIDINE GLUCONATE 4 % EX LIQD
1.0000 "application " | Freq: Once | CUTANEOUS | Status: DC
  Filled 2014-03-22: qty 15

## 2014-03-22 MED ORDER — TRAMADOL HCL 50 MG PO TABS
50.0000 mg | ORAL_TABLET | Freq: Four times a day (QID) | ORAL | Status: DC
  Administered 2014-03-22 – 2014-03-26 (×14): 50 mg via ORAL
  Filled 2014-03-22 (×14): qty 1

## 2014-03-22 MED ORDER — SODIUM CHLORIDE 0.9 % IJ SOLN: 9.0000 mL | INTRAMUSCULAR | Status: DC | PRN

## 2014-03-22 MED ORDER — PEG 3350-KCL-NA BICARB-NACL 420 G PO SOLR
4000.0000 mL | Freq: Once | ORAL | Status: DC
  Filled 2014-03-22: qty 4000

## 2014-03-22 MED ORDER — HEMOSTATIC AGENTS (NO CHARGE) OPTIME
TOPICAL | Status: DC | PRN
  Administered 2014-03-22 (×2): 1 via TOPICAL

## 2014-03-22 MED ORDER — SUCCINYLCHOLINE CHLORIDE 20 MG/ML IJ SOLN
INTRAMUSCULAR | Status: AC
  Filled 2014-03-22: qty 1

## 2014-03-22 MED ORDER — ALVIMOPAN 12 MG PO CAPS
12.0000 mg | ORAL_CAPSULE | Freq: Two times a day (BID) | ORAL | Status: DC
  Administered 2014-03-23 – 2014-03-24 (×4): 12 mg via ORAL
  Filled 2014-03-22 (×6): qty 1

## 2014-03-22 MED ORDER — DIPHENHYDRAMINE HCL 50 MG/ML IJ SOLN: 12.5000 mg | Freq: Four times a day (QID) | INTRAMUSCULAR | Status: DC | PRN

## 2014-03-22 MED ORDER — ONDANSETRON HCL 4 MG PO TABS: 4.0000 mg | ORAL_TABLET | Freq: Four times a day (QID) | ORAL | Status: DC | PRN

## 2014-03-22 MED ORDER — 0.9 % SODIUM CHLORIDE (POUR BTL) OPTIME
TOPICAL | Status: DC | PRN
  Administered 2014-03-22 (×3): 1000 mL

## 2014-03-22 MED ORDER — NEOSTIGMINE METHYLSULFATE 10 MG/10ML IV SOLN
INTRAVENOUS | Status: DC | PRN
  Administered 2014-03-22: 3 mg via INTRAVENOUS

## 2014-03-22 MED ORDER — ENSURE COMPLETE PO LIQD
237.0000 mL | Freq: Two times a day (BID) | ORAL | Status: DC
  Administered 2014-03-23 – 2014-03-24 (×3): 237 mL via ORAL

## 2014-03-22 MED ORDER — ALBUMIN HUMAN 5 % IV SOLN
INTRAVENOUS | Status: DC | PRN
  Administered 2014-03-22 (×2): via INTRAVENOUS

## 2014-03-22 MED ORDER — ENOXAPARIN SODIUM 40 MG/0.4ML ~~LOC~~ SOLN
40.0000 mg | SUBCUTANEOUS | Status: DC
  Administered 2014-03-23 – 2014-03-24 (×2): 40 mg via SUBCUTANEOUS
  Filled 2014-03-22 (×3): qty 0.4

## 2014-03-22 MED ORDER — ROCURONIUM BROMIDE 100 MG/10ML IV SOLN
INTRAVENOUS | Status: DC | PRN
  Administered 2014-03-22: 5 mg via INTRAVENOUS
  Administered 2014-03-22: 10 mg via INTRAVENOUS
  Administered 2014-03-22: 35 mg via INTRAVENOUS
  Administered 2014-03-22: 10 mg via INTRAVENOUS

## 2014-03-22 MED ORDER — PHENYLEPHRINE HCL 10 MG/ML IJ SOLN
10.0000 mg | INTRAVENOUS | Status: DC | PRN
  Administered 2014-03-22: 10 ug/min via INTRAVENOUS

## 2014-03-22 MED ORDER — FENTANYL CITRATE 0.05 MG/ML IJ SOLN
INTRAMUSCULAR | Status: AC
  Administered 2014-03-22: 50 ug via INTRAVENOUS
  Filled 2014-03-22: qty 2

## 2014-03-22 MED ORDER — HYDROMORPHONE 0.3 MG/ML IV SOLN
INTRAVENOUS | Status: DC
  Administered 2014-03-22: 12:00:00 via INTRAVENOUS
  Administered 2014-03-22: 0.99 mg via INTRAVENOUS
  Administered 2014-03-23: 0.2 mg via INTRAVENOUS
  Administered 2014-03-23: 3.39 mg via INTRAVENOUS
  Administered 2014-03-24: 1.39 mg via INTRAVENOUS
  Administered 2014-03-24: 10:00:00 via INTRAVENOUS
  Administered 2014-03-24: 0.999 mg via INTRAVENOUS
  Administered 2014-03-24: 0.2 mg via INTRAVENOUS
  Administered 2014-03-24: 1.39 mg via INTRAVENOUS
  Administered 2014-03-24: 3.39 mg via INTRAVENOUS
  Filled 2014-03-22: qty 25

## 2014-03-22 MED ORDER — POVIDONE-IODINE 10 % EX OINT
TOPICAL_OINTMENT | CUTANEOUS | Status: DC | PRN
  Administered 2014-03-22: 1 via TOPICAL

## 2014-03-22 MED ORDER — LATANOPROST 0.005 % OP SOLN
1.0000 [drp] | Freq: Every day | OPHTHALMIC | Status: DC
  Administered 2014-03-22 – 2014-03-25 (×4): 1 [drp] via OPHTHALMIC
  Filled 2014-03-22 (×2): qty 2.5

## 2014-03-22 MED ORDER — LIDOCAINE HCL (CARDIAC) 20 MG/ML IV SOLN
INTRAVENOUS | Status: DC | PRN
  Administered 2014-03-22: 40 mg via INTRAVENOUS

## 2014-03-22 MED ORDER — DIPHENHYDRAMINE HCL 12.5 MG/5ML PO ELIX: 12.5000 mg | ORAL_SOLUTION | Freq: Four times a day (QID) | ORAL | Status: DC | PRN

## 2014-03-22 MED ORDER — SULFASALAZINE 500 MG PO TBEC
1000.0000 mg | DELAYED_RELEASE_TABLET | Freq: Every day | ORAL | Status: DC
  Administered 2014-03-23 – 2014-03-26 (×4): 1000 mg via ORAL
  Filled 2014-03-22 (×4): qty 2

## 2014-03-22 SURGICAL SUPPLY — 69 items
BLADE SURG ROTATE 9660 (MISCELLANEOUS) IMPLANT
CANISTER SUCTION 2500CC (MISCELLANEOUS) ×5 IMPLANT
CHLORAPREP W/TINT 26ML (MISCELLANEOUS) ×5 IMPLANT
COVER MAYO STAND STRL (DRAPES) ×4 IMPLANT
COVER SURGICAL LIGHT HANDLE (MISCELLANEOUS) ×5 IMPLANT
DRAPE LAPAROSCOPIC ABDOMINAL (DRAPES) ×5 IMPLANT
DRAPE PROXIMA HALF (DRAPES) ×10 IMPLANT
DRAPE UTILITY XL STRL (DRAPES) ×10 IMPLANT
DRAPE WARM FLUID 44X44 (DRAPE) ×5 IMPLANT
DRSG OPSITE POSTOP 3X4 (GAUZE/BANDAGES/DRESSINGS) ×3 IMPLANT
DRSG OPSITE POSTOP 4X10 (GAUZE/BANDAGES/DRESSINGS) IMPLANT
DRSG OPSITE POSTOP 4X8 (GAUZE/BANDAGES/DRESSINGS) ×3 IMPLANT
ELECT BLADE 6.5 EXT (BLADE) ×5 IMPLANT
ELECT CAUTERY BLADE 6.4 (BLADE) ×7 IMPLANT
ELECT REM PT RETURN 9FT ADLT (ELECTROSURGICAL) ×5
ELECTRODE REM PT RTRN 9FT ADLT (ELECTROSURGICAL) ×3 IMPLANT
GAUZE SPONGE 4X4 16PLY XRAY LF (GAUZE/BANDAGES/DRESSINGS) ×3 IMPLANT
GEL ULTRASOUND 20GR AQUASONIC (MISCELLANEOUS) ×3 IMPLANT
GLOVE BIO SURGEON STRL SZ7 (GLOVE) ×6 IMPLANT
GLOVE BIOGEL PI IND STRL 6.5 (GLOVE) ×3 IMPLANT
GLOVE BIOGEL PI IND STRL 7.0 (GLOVE) ×2 IMPLANT
GLOVE BIOGEL PI IND STRL 7.5 (GLOVE) ×2 IMPLANT
GLOVE BIOGEL PI IND STRL 8 (GLOVE) ×5 IMPLANT
GLOVE BIOGEL PI INDICATOR 6.5 (GLOVE) ×6
GLOVE BIOGEL PI INDICATOR 7.0 (GLOVE) ×4
GLOVE BIOGEL PI INDICATOR 7.5 (GLOVE) ×4
GLOVE BIOGEL PI INDICATOR 8 (GLOVE) ×2
GLOVE ECLIPSE 7.5 STRL STRAW (GLOVE) ×13 IMPLANT
GLOVE EUDERMIC 7 POWDERFREE (GLOVE) ×6 IMPLANT
GLOVE SURG SS PI 7.0 STRL IVOR (GLOVE) ×3 IMPLANT
GOWN STRL REUS W/ TWL LRG LVL3 (GOWN DISPOSABLE) ×16 IMPLANT
GOWN STRL REUS W/ TWL XL LVL3 (GOWN DISPOSABLE) ×2 IMPLANT
GOWN STRL REUS W/TWL LRG LVL3 (GOWN DISPOSABLE) ×20
GOWN STRL REUS W/TWL XL LVL3 (GOWN DISPOSABLE) ×10
HEMOSTAT SNOW SURGICEL 2X4 (HEMOSTASIS) ×6 IMPLANT
KIT BASIN OR (CUSTOM PROCEDURE TRAY) ×5 IMPLANT
KIT ROOM TURNOVER OR (KITS) ×5 IMPLANT
LEGGING LITHOTOMY PAIR STRL (DRAPES) ×3 IMPLANT
LIGASURE IMPACT 36 18CM CVD LR (INSTRUMENTS) IMPLANT
NS IRRIG 1000ML POUR BTL (IV SOLUTION) ×13 IMPLANT
PACK GENERAL/GYN (CUSTOM PROCEDURE TRAY) ×5 IMPLANT
PAD ARMBOARD 7.5X6 YLW CONV (MISCELLANEOUS) ×5 IMPLANT
PENCIL BUTTON HOLSTER BLD 10FT (ELECTRODE) ×2 IMPLANT
SPECIMEN JAR MEDIUM (MISCELLANEOUS) ×6 IMPLANT
SPONGE LAP 18X18 X RAY DECT (DISPOSABLE) ×9 IMPLANT
STAPLER CUT CVD 40MM BLUE (STAPLE) ×3 IMPLANT
STAPLER VISISTAT 35W (STAPLE) ×5 IMPLANT
SUCTION POOLE TIP (SUCTIONS) ×5 IMPLANT
SURGILUBE 2OZ TUBE FLIPTOP (MISCELLANEOUS) ×6 IMPLANT
SUT NOVA NAB DX-16 0-1 5-0 T12 (SUTURE) ×2 IMPLANT
SUT PDS AB 1 TP1 96 (SUTURE) ×10 IMPLANT
SUT PROLENE 2 0 KS (SUTURE) ×3 IMPLANT
SUT SILK 2 0 SH (SUTURE) ×2 IMPLANT
SUT SILK 2 0 SH CR/8 (SUTURE) ×5 IMPLANT
SUT SILK 2 0 TIES 10X30 (SUTURE) ×8 IMPLANT
SUT SILK 2 0SH CR/8 30 (SUTURE) ×6 IMPLANT
SUT SILK 3 0 SH CR/8 (SUTURE) ×8 IMPLANT
SUT SILK 3 0 TIES 10X30 (SUTURE) ×8 IMPLANT
SUT VIC AB 2-0 SH 27 (SUTURE) ×10
SUT VIC AB 2-0 SH 27X BRD (SUTURE) ×2 IMPLANT
SYR BULB IRRIGATION 50ML (SYRINGE) ×5 IMPLANT
TOWEL OR 17X26 10 PK STRL BLUE (TOWEL DISPOSABLE) ×7 IMPLANT
TRAY FOLEY CATH 14FRSI W/METER (CATHETERS) ×3 IMPLANT
TRAY PROCTOSCOPIC FIBER OPTIC (SET/KITS/TRAYS/PACK) ×6 IMPLANT
TUBE CONNECTING 12'X1/4 (SUCTIONS)
TUBE CONNECTING 12X1/4 (SUCTIONS) ×2 IMPLANT
TUBE CONNECTING 20'X1/4 (TUBING) ×1
TUBE CONNECTING 20X1/4 (TUBING) ×2 IMPLANT
YANKAUER SUCT BULB TIP NO VENT (SUCTIONS) ×5 IMPLANT

## 2014-03-22 NOTE — Interval H&P Note (Signed)
History and Physical Interval Note:  03/22/2014 7:20 AM  Ashley [name]  has presented today for surgery, with the diagnosis of colostomy in place  The various methods of treatment have been discussed with the patient and family. After consideration of risks, benefits and other options for treatment, the patient has consented to  Procedure(s): REVERSAL OF COLOSTOMY (N/A) POSSIBLE DIVERTING  LOOP ILEOSTOMY (N/A) as a surgical intervention .  The patient's history has been reviewed, patient examined, no change in status, stable for surgery.  I have reviewed the patient's chart and labs.  Questions were answered to the patient's satisfaction.     Genevieve Ritzel, JAY

## 2014-03-22 NOTE — H&P (Addendum)
Chief complaint:  Status post colectomy and colostomy for enterocutaneous fistula  History of present illness:  Patient was admitted initialy with this problem on 08/13/13 with a diverticular abscess that was percutaneously drained. The patient did well initially but subsequently developed a colocutaneous fistula. She was not able to eat. She was taken to the operating room on 08/28/2013 at which time she underwent a colectomy and colostomy.  She was discharged to home and since that time as been recovering from surgery. Only recently has she started to gain weight and eat normally with good colostomy output. She underwent a preoperative water-soluble enema. This demonstrated a short distal rectosigmoid segment which would likely be the most likely reverse in the lithotomy position. Her anticoagulation should've been stopped. She will get preoperative Entereg.     PMHx:  . HX: anticoagulation 09/03/2013  . SVT (supraventricular tachycardia) 09/03/2013  . Diverticulitis of large intestine with perforation 08/27/2013  . Colocutaneous fistula 08/25/2013  . Diverticulitis 08/13/2013  . Diverticulitis of large intestine with abscess without bleeding 08/13/2013  . Anemia 08/13/2013  . UTI (urinary tract infection) 08/13/2013  . Protein-calorie malnutrition, severe 08/13/2013  . Encounter for therapeutic drug monitoring 04/10/2013  . Atrial fibrillation 05/28/2012  . GERD (gastroesophageal reflux disease) 05/28/2012  . Rheumatoid arthritis(714.0) 05/28/2012  . Renal cell carcinoma 05/28/2012  . Rectal bleeding 05/28/2012  . Acute blood loss anemia 05/28/2012  . Hypertension 11/03/2010  . SYNCOPE    Current Medications:  Vitamin D (50000UNIT Capsule, Oral) Active. Restasis (0.05% Emulsion, Ophthalmic) Active. Trusopt Ocumeter Plus (2% Solution, Ophthalmic) Active. Ensure (Oral) Active. Tricor (145MG  Tablet, Oral) Active. Microzide  (12.5MG  Capsule, Oral) Active. Xalatan (0.005% Solution, Ophthalmic) Active. Lovaza (1GM Capsule, Oral) Active. MiraLax (Oral) Active  ROS:  No significant problems   Physical examination:  Current Vitals:  BP 137/93, P 76, Afebrile.  Weight 114 pounds  Chest and Lung Exam Auscultation Breath sounds - Normal and Clear.  Cardiovascular Cardiovascular examination reveals -normal heart sounds, regular rate and rhythm with no murmurs.  Abdomen Inspection Skin - Colostomy - Left Lower Quadrant. Palpation/Percussion Palpation and Percussion of the abdomen reveal - Soft, Non Tender, No Rebound tenderness and No Rigidity (guarding). Auscultation Auscultation of the abdomen reveals - Bowel sounds normal. Note: Lower midline incision is healed and intact. No apparent hernia      Assessment & Plan Kathryne Eriksson. Devlyn Parish MD; 02/08/2014 2:53 PM)  COLOSTOMY IN PLACE (V44.3  Z93.3) Patient stable for surgery. Has received Entereg, tolerated bowel prep but little sleep.   Ready for the OR.  Kathryne Eriksson. Dahlia Bailiff, MD, [place] [phone] [facility]

## 2014-03-22 NOTE — Op Note (Signed)
OPERATIVE REPORT  DATE OF OPERATION: 03/22/2014  PATIENT:  Ashley [name]  81 y.o. female  PRE-OPERATIVE DIAGNOSIS:  COLOSTOMY  POST-OPERATIVE DIAGNOSIS:  COLOSTOMY  PROCEDURE:  Procedure(s): REVERSAL OF COLOSTOMY RIGID PROCTOSCOPY HYSTERECTOMY ABDOMINAL  SURGEON:  Surgeon(s): Doreen Salvage, MD Fanny Skates, MD  ASSISTANT: Dalbert Batman  ANESTHESIA:   general  EBL: 300 ml  BLOOD ADMINISTERED: none  DRAINS: Urinary Catheter (Foley)   SPECIMEN:  Source of Specimen:  colon from colostomy  COUNTS CORRECT:  YES  PROCEDURE DETAILS: The patient was taken to the operating room and placed on the table in the supine position. After an adequate general endotracheal anesthetic was administered a proper timeout was performed identifying the patient and procedures to be performed. We started with a rigid sigmoidoscopy done to identify the extent of her rectal stump. This was also used to irrigate the rectal pouch with Betadine and saline. The sigmoidoscope extended up to a maximal depth of approximately 12 cm.  The patient was subsequently placed in lithotomy position and then prepped and draped in usual sterile manner exposing her perineum and her abdomen.  The colostomy site was closed using a running locking stitch of 2-0 silk. Once this was done we subsequently made a midline incision through her previous lower midline incision using a #10 blade. This was taken down into the fascia using electrocautery then we lifted up the fascia as we slowly into the peritoneal cavity using a #10 blade in order to avoid adhesions of small bowel to the anterior abdominal wall. There were adhesions of small bowel to the wall which was subsequently removed using Metzembaum scissors and electrocautery. We also had to free up multiple loops of small bowel from each other, from the colostomy in the left upper quadrant, and also from distal pelvic adhesions. During the process no full-thickness enterotomies were made  although there were 2 serosal tears which were repaired with interrupted 3-0 silk sutures.  Upon examining the pelvis the uterus was densely adhered to an inflammatory process that included the rectal stump. In order to identify and mobilize enough of the rectal stump in order to perform an anastomosis a hysterectomy was performed. The gonadal vessels were taken down using suture ligatures of 2-0 silk and the fallopian tubes are dissected away also in a similar manner. We came across the distal cervix using Heaney clamps and then oversewed this area at the vagina using running locking stitches of 2-0 Vicryl. The uterus was removed and bleeding was controlled.  We subsequently were able to get down into the pelvis to some degree but not circumferentially around the rectal stump. The sutures attached to the staple line with densely adhesed and to the pelvic area not allowing for complete mobilization. There was some bleeding from the mesentery of the rectosigmoid stump which was controlled with electrocautery, suture ligatures of 2-0 silk, and Surgicel snow.  The assistant pass a rigid sigmoidoscopy through the anus into the rectal stump as we identified it on the inside, intra-abdominally. We could not get the scope to go to the very distal portion of the rectosigmoid stump with a suture was marked. We ended up making a anastomosis on the anterior wall of the rectal stump.  The colostomy was taken down using a #15 blade and also electrocautery freeing it from his complete fascial attachments. We subsequently cut out the distal 3 cm using a pursestring device and a 2-0 Prolene on a Keith needle. Using the sizers were able to place a 29  mm anvil from the premium EEA device into the proximal colon.  The assistant then again passed the premium EEA handle through the anorectal stump area up to the site previously identified for the anterior rectal wall anastomosis. We both personally saw the trocar from the  device penetrate the anterior rectal wall with attached to the anvil that had been secured in place with the pursestring suture at the colostomy taken down. We closed the device and fired the premium device forming to complete circular rectal: Stumps. Upon checking for anastomosis the scope showed a complete staple line and there was no air leak at all. We subsequently removed the stapling device and the rigid sigmoidoscope.  We irrigated the area with saline solution, control the bleeding in the rectosigmoid stump. After changing of gloves and gowns the fascia was closed using running looped #1 PDS suture. The colostomy had calmed through a very thin layer of fascia which did not need to be closed separately. Were able to close the fascia completed using running looped #1 PDS suture. All needle counts, sponge counts, and instrument counts were correct. A sterile dressing was applied as the skin was closed using stainless steel staples.  PATIENT DISPOSITION:  PACU - hemodynamically stable.   Ashley [name], JAY 1/4/201610:53 AM

## 2014-03-22 NOTE — Transfer of Care (Signed)
Immediate Anesthesia Transfer of Care Note  Patient: Ashley [name]  Procedure(s) Performed: Procedure(s): REVERSAL OF COLOSTOMY (N/A) RIGID PROCTOSCOPY (N/A) HYSTERECTOMY ABDOMINAL (N/A)  Patient Location: PACU  Anesthesia Type:General  Level of Consciousness: awake, alert  and sedated  Airway & Oxygen Therapy: Patient connected to face mask oxygen  Post-op Assessment: Report given to PACU RN  Post vital signs: stable  Complications: No apparent anesthesia complications

## 2014-03-22 NOTE — Anesthesia Procedure Notes (Signed)
Procedure Name: Intubation Date/Time: 03/22/2014 7:38 AM Performed by: Maeola Harman Pre-anesthesia Checklist: Patient identified, Emergency Drugs available, Suction available, Patient being monitored and Timeout performed Patient Re-evaluated:Patient Re-evaluated prior to inductionOxygen Delivery Method: Circle system utilized Preoxygenation: Pre-oxygenation with 100% oxygen Intubation Type: IV induction Ventilation: Mask ventilation without difficulty Laryngoscope Size: Mac and 3 Grade View: Grade I Tube type: Oral Tube size: 7.5 mm Number of attempts: 1 Airway Equipment and Method: Stylet Placement Confirmation: ETT inserted through vocal cords under direct vision,  positive ETCO2 and breath sounds checked- equal and bilateral Secured at: 21 cm Tube secured with: Tape Dental Injury: Teeth and Oropharynx as per pre-operative assessment  Comments: Easy atraumatic induction and intubation with MAC 3 blade.  Dr. Marcie Bal verified placement.  Waldron Session, CRNA

## 2014-03-22 NOTE — Anesthesia Postprocedure Evaluation (Signed)
  Anesthesia Post-op Note  Patient: Ashley [name]  Procedure(s) Performed: Procedure(s): REVERSAL OF COLOSTOMY (N/A) RIGID PROCTOSCOPY (N/A) HYSTERECTOMY ABDOMINAL (N/A)  Patient Location: PACU  Anesthesia Type:General  Level of Consciousness: awake  Airway and Oxygen Therapy: Patient Spontanous Breathing  Post-op Pain: mild  Post-op Assessment: Post-op Vital signs reviewed  Post-op Vital Signs: Reviewed  Last Vitals:  Filed Vitals:   03/22/14 1300  BP: 182/99  Pulse: 98  Temp: 36.7 C  Resp:     Complications: No apparent anesthesia complications

## 2014-03-22 NOTE — Anesthesia Preprocedure Evaluation (Addendum)
Anesthesia Evaluation  Patient identified by MRN, date of birth, ID band Patient awake    Reviewed: Allergy & Precautions, H&P , NPO status , Patient's Chart, lab work & pertinent test results  Airway Mallampati: II  TM Distance: >3 FB Neck ROM: full    Dental   Pulmonary asthma ,  breath sounds clear to auscultation        Cardiovascular hypertension, Pt. on medications Rhythm:Regular Rate:Normal     Neuro/Psych  Headaches,    GI/Hepatic GERD-  ,  Endo/Other    Renal/GU Renal diseaseH/o renal cell CA     Musculoskeletal  (+) Arthritis -,   Abdominal   Peds  Hematology  (+) anemia ,   Anesthesia Other Findings   Reproductive/Obstetrics                        Anesthesia Physical Anesthesia Plan  ASA: II  Anesthesia Plan: General   Post-op Pain Management:    Induction: Intravenous  Airway Management Planned: Oral ETT  Additional Equipment:   Intra-op Plan:   Post-operative Plan: Extubation in OR  Informed Consent: I have reviewed the patients History and Physical, chart, labs and discussed the procedure including the risks, benefits and alternatives for the proposed anesthesia with the patient or authorized representative who has indicated his/her understanding and acceptance.     Plan Discussed with: CRNA, Anesthesiologist and Surgeon  Anesthesia Plan Comments:         Anesthesia Quick Evaluation

## 2014-03-22 NOTE — Progress Notes (Signed)
Patient looks pale.  Tachycardic.  Urine output is good.  Will check stat CBC.  Kathryne Eriksson. Dahlia Bailiff, MD, [place] [phone] [facility]

## 2014-03-23 ENCOUNTER — Encounter (HOSPITAL_COMMUNITY): Payer: Self-pay | Admitting: General Surgery

## 2014-03-23 LAB — CBC
HCT: 22.6 % — ABNORMAL LOW (ref 36.0–46.0)
HEMATOCRIT: 24.7 % — AB (ref 36.0–46.0)
HEMOGLOBIN: 7.8 g/dL — AB (ref 12.0–15.0)
Hemoglobin: 8.2 g/dL — ABNORMAL LOW (ref 12.0–15.0)
MCH: 33.6 pg (ref 26.0–34.0)
MCH: 33.5 pg (ref 26.0–34.0)
MCHC: 34.5 g/dL (ref 30.0–36.0)
MCHC: 33.2 g/dL (ref 30.0–36.0)
MCV: 97.4 fL (ref 78.0–100.0)
MCV: 100.8 fL — AB (ref 78.0–100.0)
Platelets: 181 10*3/uL (ref 150–400)
Platelets: 255 10*3/uL (ref 150–400)
RBC: 2.32 MIL/uL — AB (ref 3.87–5.11)
RBC: 2.45 MIL/uL — ABNORMAL LOW (ref 3.87–5.11)
RDW: 13.4 % (ref 11.5–15.5)
RDW: 13.7 % (ref 11.5–15.5)
WBC: 9.1 10*3/uL (ref 4.0–10.5)
WBC: 11.2 10*3/uL — AB (ref 4.0–10.5)

## 2014-03-23 LAB — BASIC METABOLIC PANEL
Anion gap: 5 (ref 5–15)
BUN: 15 mg/dL (ref 6–23)
CHLORIDE: 106 meq/L (ref 96–112)
CO2: 21 mmol/L (ref 19–32)
Calcium: 8.1 mg/dL — ABNORMAL LOW (ref 8.4–10.5)
Creatinine, Ser: 1.3 mg/dL — ABNORMAL HIGH (ref 0.50–1.10)
GFR calc Af Amer: 43 mL/min — ABNORMAL LOW (ref >90)
GFR calc non Af Amer: 37 mL/min — ABNORMAL LOW (ref >90)
GLUCOSE: 150 mg/dL — AB (ref 70–99)
Potassium: 3.8 mmol/L (ref 3.5–5.1)
SODIUM: 132 mmol/L — AB (ref 135–145)

## 2014-03-23 LAB — GLUCOSE, CAPILLARY: Glucose-Capillary: 118 mg/dL — ABNORMAL HIGH (ref 70–99)

## 2014-03-23 MED ORDER — PNEUMOCOCCAL VAC POLYVALENT 25 MCG/0.5ML IJ INJ
0.5000 mL | INJECTION | INTRAMUSCULAR | Status: AC
  Administered 2014-03-24: 0.5 mL via INTRAMUSCULAR
  Filled 2014-03-23: qty 0.5

## 2014-03-23 MED ORDER — INFLUENZA VAC SPLIT QUAD 0.5 ML IM SUSY
0.5000 mL | PREFILLED_SYRINGE | INTRAMUSCULAR | Status: AC
  Administered 2014-03-24: 0.5 mL via INTRAMUSCULAR
  Filled 2014-03-23: qty 0.5

## 2014-03-23 MED ORDER — SODIUM CHLORIDE 0.9 % IV BOLUS (SEPSIS)
500.0000 mL | Freq: Once | INTRAVENOUS | Status: AC
  Administered 2014-03-23: 500 mL via INTRAVENOUS

## 2014-03-23 NOTE — Progress Notes (Signed)
0530 Patient alert and oriented with no c/o pain. Normal Saline bolus ended at 0325 and 0530 urine output is 220 clear yellow urine. Patient's heart rate is now in 90's. Will continue to monitor.

## 2014-03-23 NOTE — Progress Notes (Signed)
0215 Patient alert and oriented. Heart rate increase ranging 120-122. Urinary ouput is 17ml. Dr. Donne Hazel notified and orders received. 0224 Normal Saline bolus started will continue to monitor.

## 2014-03-23 NOTE — Care Management Note (Signed)
  Page 1 of 1   03/26/2014     8:33:25 AM CARE MANAGEMENT NOTE 03/26/2014  Patient:  Ashley [name], Ashley [name]   Account Number:  [id number]  Date Initiated:  03/23/2014  Documentation initiated by:  Magdalen Spatz  Subjective/Objective Assessment:     Action/Plan:   Anticipated DC Date:  03/28/2014   Anticipated DC Plan:  HOME/SELF CARE         Choice offered to / List presented to:             Status of service:   Medicare Important Message given?  YES (If response is "NO", the following Medicare IM given date fields will be blank) Date Medicare IM given:  03/23/2014 Medicare IM given by:  Magdalen Spatz Date Additional Medicare IM given:  03/26/2014 Additional Medicare IM given by:  Magdalen Spatz  Discharge Disposition:    Per UR Regulation:  Reviewed for med. necessity/level of care/duration of stay  If discussed at Axis of Stay Meetings, dates discussed:    Comments:

## 2014-03-23 NOTE — Progress Notes (Signed)
GS Progress Note Subjective: The patient is conversant, no distress, looks pale, and states that she is tired.  Objective: Vital signs in last 24 hours: Temp:  [96.8 F (36 C)-98.9 F (37.2 C)] 98.6 F (37 C) (01/05 0511) Pulse Rate:  [62-120] 96 (01/05 0511) Resp:  [13-29] 23 (01/05 0749) BP: (147-186)/(70-99) 152/72 mmHg (01/05 0511) SpO2:  [98 %-100 %] 98 % (01/05 0749)    Intake/Output from previous day: 01/04 0701 - 01/05 0700 In: 4870.8 [P.O.:240; I.V.:3580.8; IV Piggyback:1050] Out: 950 [Urine:650; Blood:300] Intake/Output this shift:    Lungs: Clear to auscultation  Abd: Has bowel sounds.  Minimally tender  Extremities: No clinical signs or symptoms of DVT.  Neuro: Intact.  Oriented x 3  Lab Results: CBC   Recent Labs  03/22/14 1951 03/23/14 0446  WBC 10.5 9.1  HGB 8.8* 7.8*  HCT 26.6* 22.6*  PLT 248 181   BMET  Recent Labs  03/23/14 0446  NA 132*  K 3.8  CL 106  CO2 21  GLUCOSE 150*  BUN 15  CREATININE 1.30*  CALCIUM 8.1*   PT/INR No results for input(s): LABPROT, INR in the last 72 hours. ABG No results for input(s): PHART, HCO3 in the last 72 hours.  Invalid input(s): PCO2, PO2  Studies/Results: No results found.  Anti-infectives: Anti-infectives    Start     Dose/Rate Route Frequency Ordered Stop   03/22/14 1430  cefOXitin (MEFOXIN) 1 g in dextrose 5 % 50 mL IVPB     1 g100 mL/hr over 30 Minutes Intravenous 3 times per day 03/22/14 1352 03/23/14 0535   03/22/14 0600  cefoTEtan (CEFOTAN) 2 g in dextrose 5 % 50 mL IVPB     2 g100 mL/hr over 30 Minutes Intravenous On call to O.R. 03/21/14 1249 03/22/14 0753   03/21/14 1249  neomycin (MYCIFRADIN) tablet 1,000 mg  Status:  Discontinued     1,000 mg Oral 3 times per day 03/21/14 1249 03/22/14 1256   03/21/14 1249  erythromycin (E-MYCIN) tablet 1,000 mg  Status:  Discontinued     1,000 mg Oral 3 times per day 03/21/14 1249 03/22/14 1256      Assessment/Plan: s/p  Procedure(s): REVERSAL OF COLOSTOMY RIGID PROCTOSCOPY HYSTERECTOMY ABDOMINAL Continue foley due to strict I&O Recheck hemoglobin later this afternoon.  If < 7.0 will give one unti of PRBCs  LOS: 1 day    Kathryne Eriksson. Dahlia Bailiff, MD, FACS [phone] [facility] 03/23/2014

## 2014-03-23 NOTE — Evaluation (Signed)
Physical Therapy Evaluation Patient Details Name: Ashley [name] MRN: [id number] DOB: 08/25/1928 Today's Date: 03/23/2014   History of Present Illness  Patient is a 81 y/o female s/p reversal of colostomy, rigid proctoscopy and hysterectomy abdominal on 1/4. PMH of HTN, HLD, RA, syncope and asthma.     Clinical Impression  Patient presents with functional limitations due to deficits listed in Pt problem list (see below). Pt with generalized weakness and pain impacting safe mobility. Pain limiting ambulation distance today. Tolerated transfer to chair with Min guard assist for safety. Encourage sitting in chair daily. Pt would benefit from skilled PT to improve transfers, gait, balance and mobility so pt can maximize independence and return to PLOF. Pt has support at home from spouse and children.    Follow Up Recommendations Home health PT;Supervision/Assistance - 24 hour    Equipment Recommendations  None recommended by PT    Recommendations for Other Services OT consult     Precautions / Restrictions Precautions Precautions: Fall Restrictions Weight Bearing Restrictions: No      Mobility  Bed Mobility   Bed Mobility: Rolling;Sidelying to Sit Rolling: Min guard Sidelying to sit: Min assist;HOB elevated       General bed mobility comments: Use of rails for support. VC's for log roll technique. Min A to elevate trunk. Cues for technique.  Transfers Overall transfer level: Needs assistance Equipment used: Rolling walker (2 wheeled) Transfers: Sit to/from Stand Sit to Stand: Min guard         General transfer comment: Min guard for safety. Increased pain with standing.  Ambulation/Gait Ambulation/Gait assistance: Min guard Ambulation Distance (Feet): 8 Feet Assistive device: Rolling walker (2 wheeled) Gait Pattern/deviations: Step-through pattern;Decreased stride length;Trunk flexed   Gait velocity interpretation: Below normal speed for age/gender General Gait  Details: Pt with slow, unsteady gait. Ambulated a few feet to the chair. Pain limiting ambulation today.   Stairs            Wheelchair Mobility    Modified Rankin (Stroke Patients Only)       Balance Overall balance assessment: Needs assistance Sitting-balance support: Feet supported;Single extremity supported Sitting balance-Leahy Scale: Fair     Standing balance support: During functional activity Standing balance-Leahy Scale: Poor Standing balance comment: Requires BUE support on RW for balance/safety.                              Pertinent Vitals/Pain Pain Assessment: 0-10 Pain Score:  (not rated on pain scale.) Pain Location: abdomen Pain Descriptors / Indicators: Sore;Aching Pain Intervention(s): Limited activity within patient's tolerance;Monitored during session;Repositioned;PCA encouraged    Home Living Family/patient expects to be discharged to:: Private residence Living Arrangements: Spouse/significant other Available Help at Discharge: Available 24 hours/day Type of Home: House Home Access: Stairs to enter Entrance Stairs-Rails: Right;Left;Can reach both Entrance Stairs-Number of Steps: 4 Home Layout: One level Home Equipment: Cane - single point;Grab bars - toilet;Grab bars - tub/shower;Walker - 2 wheels;Wheelchair - manual      Prior Function Level of Independence: Needs assistance   Gait / Transfers Assistance Needed: assist for stairs. Uses SPC for ambulation PTA.   ADL's / Homemaking Assistance Needed: spouse assists with IADLs and some ADLs.   Comments: Family (daughter and son) will be staying with pt for ~3 weeks to assist at home.     Hand Dominance        Extremity/Trunk Assessment   Upper Extremity Assessment: Defer to  OT evaluation;Generalized weakness           Lower Extremity Assessment: Generalized weakness         Communication   Communication: No difficulties  Cognition Arousal/Alertness:  Awake/alert Behavior During Therapy: WFL for tasks assessed/performed Overall Cognitive Status: Within Functional Limits for tasks assessed                      General Comments General comments (skin integrity, edema, etc.): Discussed importance of sitting in chair and mobility to decrease affects of bedrest- PNA, blood clots, sores etc.     Exercises        Assessment/Plan    PT Assessment Patient needs continued PT services  PT Diagnosis Generalized weakness;Acute pain;Difficulty walking   PT Problem List Decreased strength;Pain;Decreased activity tolerance;Decreased mobility;Decreased balance  PT Treatment Interventions Balance training;Gait training;Patient/family education;Functional mobility training;Therapeutic activities;Therapeutic exercise;Stair training;Neuromuscular re-education   PT Goals (Current goals can be found in the Care Plan section) Acute Rehab PT Goals Patient Stated Goal: to return home PT Goal Formulation: With patient Time For Goal Achievement: 04/06/14 Potential to Achieve Goals: Fair    Frequency Min 3X/week   Barriers to discharge        Co-evaluation               End of Session Equipment Utilized During Treatment: Gait belt Activity Tolerance: Patient limited by fatigue;Patient limited by pain Patient left: in chair;with call bell/phone within reach;with family/visitor present Nurse Communication: Mobility status         Time: 7356-7014 PT Time Calculation (min) (ACUTE ONLY): 26 min   Charges:   PT Evaluation $Initial PT Evaluation Tier I: 1 Procedure PT Treatments $Therapeutic Activity: 8-22 mins   PT G CodesCandy Sledge A 04/10/14, 3:09 PM Candy Sledge, [place], DPT [phone]

## 2014-03-24 DIAGNOSIS — Z433 Encounter for attention to colostomy: Secondary | ICD-10-CM | POA: Diagnosis not present

## 2014-03-24 LAB — BASIC METABOLIC PANEL
Anion gap: 5 (ref 5–15)
BUN: 13 mg/dL (ref 6–23)
CHLORIDE: 110 meq/L (ref 96–112)
CO2: 21 mmol/L (ref 19–32)
CREATININE: 1.27 mg/dL — AB (ref 0.50–1.10)
Calcium: 8.7 mg/dL (ref 8.4–10.5)
GFR calc Af Amer: 44 mL/min — ABNORMAL LOW (ref >90)
GFR, EST NON AFRICAN AMERICAN: 38 mL/min — AB (ref >90)
Glucose, Bld: 112 mg/dL — ABNORMAL HIGH (ref 70–99)
Potassium: 4.4 mmol/L (ref 3.5–5.1)
Sodium: 136 mmol/L (ref 135–145)

## 2014-03-24 LAB — CBC WITH DIFFERENTIAL/PLATELET
BASOS PCT: 0 % (ref 0–1)
Basophils Absolute: 0 10*3/uL (ref 0.0–0.1)
EOS ABS: 0 10*3/uL (ref 0.0–0.7)
Eosinophils Relative: 0 % (ref 0–5)
HEMATOCRIT: 21.2 % — AB (ref 36.0–46.0)
Hemoglobin: 7 g/dL — ABNORMAL LOW (ref 12.0–15.0)
LYMPHS ABS: 1.2 10*3/uL (ref 0.7–4.0)
LYMPHS PCT: 13 % (ref 12–46)
MCH: 32.7 pg (ref 26.0–34.0)
MCHC: 33 g/dL (ref 30.0–36.0)
MCV: 99.1 fL (ref 78.0–100.0)
Monocytes Absolute: 0.7 10*3/uL (ref 0.1–1.0)
Monocytes Relative: 8 % (ref 3–12)
NEUTROS PCT: 79 % — AB (ref 43–77)
Neutro Abs: 7.6 10*3/uL (ref 1.7–7.7)
Platelets: 189 10*3/uL (ref 150–400)
RBC: 2.14 MIL/uL — ABNORMAL LOW (ref 3.87–5.11)
RDW: 13.4 % (ref 11.5–15.5)
WBC: 9.7 10*3/uL (ref 4.0–10.5)

## 2014-03-24 LAB — PREPARE RBC (CROSSMATCH)

## 2014-03-24 MED ORDER — ENOXAPARIN SODIUM 30 MG/0.3ML ~~LOC~~ SOLN
30.0000 mg | SUBCUTANEOUS | Status: DC
  Administered 2014-03-25 – 2014-03-26 (×2): 30 mg via SUBCUTANEOUS
  Filled 2014-03-24 (×2): qty 0.3

## 2014-03-24 MED ORDER — BOOST / RESOURCE BREEZE PO LIQD
1.0000 | Freq: Three times a day (TID) | ORAL | Status: DC
  Administered 2014-03-24 – 2014-03-26 (×6): 1 via ORAL

## 2014-03-24 MED ORDER — SODIUM CHLORIDE 0.9 % IV SOLN
Freq: Once | INTRAVENOUS | Status: AC
  Administered 2014-03-24: 10:00:00 via INTRAVENOUS

## 2014-03-24 NOTE — Progress Notes (Signed)
Physical Therapy Treatment Patient Details Name: Ashley [name] MRN: [id number] DOB: 04/13/1928 Today's Date: 03/24/2014    History of Present Illness Patient is a 81 y/o female s/p reversal of colostomy, rigid proctoscopy and hysterectomy abdominal on 1/4. PMH of HTN, HLD, RA, syncope and asthma.     PT Comments    Patient requiring increased time and rest breaks for most mobility. Mobility limited by decreased Hgb and fatigue. Patient agreeable to OOB to recliner. Once up requested to go to [facility]. Patient to receive blood later today.   Follow Up Recommendations  Home health PT;Supervision/Assistance - 24 hour     Equipment Recommendations  None recommended by PT    Recommendations for Other Services       Precautions / Restrictions Precautions Precautions: Fall    Mobility  Bed Mobility Overal bed mobility: Needs Assistance     Sidelying to sit: Mod assist       General bed mobility comments: Use of rails for support. VC's for log roll technique. Mod A to elevate trunk. Cues for technique.  Transfers Overall transfer level: Needs assistance Equipment used: Rolling walker (2 wheeled)   Sit to Stand: Min assist         General transfer comment: Min A to power up into standing and cues for positioning.   Ambulation/Gait Ambulation/Gait assistance: Min assist Ambulation Distance (Feet): 10 Feet Assistive device: 1 person hand held assist Gait Pattern/deviations: Shuffle;Trunk flexed   Gait velocity interpretation: Below normal speed for age/gender General Gait Details: Pt with slow, unsteady gait. Ambulated a few feet to the bsc and recliner. Did not push further ambulation due to decreased HGB and patient fatigue.    Stairs            Wheelchair Mobility    Modified Rankin (Stroke Patients Only)       Balance                                    Cognition Arousal/Alertness: Awake/alert Behavior During Therapy: WFL for tasks  assessed/performed Overall Cognitive Status: Within Functional Limits for tasks assessed                      Exercises      General Comments        Pertinent Vitals/Pain Pain Assessment: Faces Pain Location: holding stomach Pain Descriptors / Indicators: Aching;Sore Pain Intervention(s): Monitored during session    Home Living                      Prior Function            PT Goals (current goals can now be found in the care plan section) Progress towards PT goals: Progressing toward goals    Frequency  Min 3X/week    PT Plan Current plan remains appropriate    Co-evaluation             End of Session   Activity Tolerance: Patient limited by fatigue;Other (comment) (Hgb 7.0)       Time: 9629-5284 PT Time Calculation (min) (ACUTE ONLY): 25 min  Charges:  $Gait Training: 8-22 mins $Therapeutic Activity: 8-22 mins                    G Codes:      Jacqualyn Posey 03/24/2014, 10:41 AM 03/24/2014 Jacqualyn Posey PTA [phone]  pager [phone] office

## 2014-03-24 NOTE — Progress Notes (Signed)
GS Progress Note Subjective: Patient doing okay.  Intermittent bursts of rapid heart rate, intermittent SVT.  Nothing sustained  Objective: Vital signs in last 24 hours: Temp:  [98.2 F (36.8 C)-99.7 F (37.6 C)] 99.7 F (37.6 C) (01/06 0519) Pulse Rate:  [98-150] 104 (01/06 0519) Resp:  [14-22] 18 (01/06 0743) BP: (129-140)/(62-73) 140/65 mmHg (01/06 0519) SpO2:  [97 %-100 %] 97 % (01/06 0743) Last BM Date: 03/22/14  Intake/Output from previous day: 01/05 0701 - 01/06 0700 In: 3572.2 [P.O.:540; I.V.:3032.2] Out: 3050 [Urine:3050] Intake/Output this shift:    Lungs: Clear to auscultatiion  Abd: Soft, present but hypoactive bowel sounds.  Extremities: No changes  Neuro: Intact  Lab Results: CBC   Recent Labs  03/23/14 1515 03/24/14 0426  WBC 11.2* 9.7  HGB 8.2* 7.0*  HCT 24.7* 21.2*  PLT 255 189   BMET  Recent Labs  03/23/14 0446 03/24/14 0426  NA 132* 136  K 3.8 4.4  CL 106 110  CO2 21 21  GLUCOSE 150* 112*  BUN 15 13  CREATININE 1.30* 1.27*  CALCIUM 8.1* 8.7   PT/INR No results for input(s): LABPROT, INR in the last 72 hours. ABG No results for input(s): PHART, HCO3 in the last 72 hours.  Invalid input(s): PCO2, PO2  Studies/Results: No results found.  Anti-infectives: Anti-infectives    Start     Dose/Rate Route Frequency Ordered Stop   03/22/14 1430  cefOXitin (MEFOXIN) 1 g in dextrose 5 % 50 mL IVPB     1 g100 mL/hr over 30 Minutes Intravenous 3 times per day 03/22/14 1352 03/23/14 0535   03/22/14 0600  cefoTEtan (CEFOTAN) 2 g in dextrose 5 % 50 mL IVPB     2 g100 mL/hr over 30 Minutes Intravenous On call to O.R. 03/21/14 1249 03/22/14 0753   03/21/14 1249  neomycin (MYCIFRADIN) tablet 1,000 mg  Status:  Discontinued     1,000 mg Oral 3 times per day 03/21/14 1249 03/22/14 1256   03/21/14 1249  erythromycin (E-MYCIN) tablet 1,000 mg  Status:  Discontinued     1,000 mg Oral 3 times per day 03/21/14 1249 03/22/14 1256       Assessment/Plan: s/p Procedure(s): REVERSAL OF COLOSTOMY RIGID PROCTOSCOPY HYSTERECTOMY ABDOMINAL Hemoglobin down to 7.  Will give one unit of PRBCs.    LOS: 2 days    Kathryne Eriksson. Dahlia Bailiff, MD, FACS [phone] [facility] 03/24/2014

## 2014-03-24 NOTE — Progress Notes (Signed)
INITIAL NUTRITION ASSESSMENT  DOCUMENTATION CODES Per approved criteria  -Severe malnutrition in the context of chronic illness   Pt meets criteria for severe MALNUTRITION in the context of chronic illness as evidenced by severe fat and muscle depletion.  INTERVENTION: -Resource Breeze po TID, each supplement provides 250 kcal and 9 grams of protein -Add Boost Plus daily and Ensure Complete Daily with diet advancement to offer variety of supplements and flavors per pt request  NUTRITION DIAGNOSIS: Malnutrition related to hx of poor po intake and weight loss as evidenced by severe fat and muscle depletion, diet hx.   Goal: Pt will meet >90% of estimated nutritional needs  Monitor:  Diet advancement, PO intake, labs, weight changes, I/O's  Reason for Assessment: MST=3  81 y.o. female  Admitting Dx: <principal problem not specified>  Patient was admitted initialy with this problem on 08/13/13 with a diverticular abscess that was percutaneously drained. The patient did well initially but subsequently developed a colocutaneous fistula. She was not able to eat. She was taken to the operating room on 08/28/2013 at which time she underwent a colectomy and colostomy.  ASSESSMENT: S/p PROCEDURE: Procedure(s) on 03/22/14: REVERSAL OF COLOSTOMY RIGID PROCTOSCOPY HYSTERECTOMY ABDOMINAL  Spoke with RN who reports good tolerance of clear liquids.Meal completion 50%.  Hx obtained mainly by pt daughter at bedside, as pt was drowsy due to medications. She reports that pt had significant wt loss prior to colostomy in June, weighing as low as 102#. She reports that pt has been working diligently on gradually gaining wt, as reversal was unable to be done until weight status improved. Noted wt gain trend over the past 6 months, due to this purpose. Daughter confirmed approximately 15# wt gain over the past 6 months, which is confirmed by wt hx.  Daughter reports that pt drinks Boost two times per day at  home. She is agreeable to supplements during hospitalization- is amenable to try [employer], but would like to transition to Ensure/Boost for consistency in home regimen. Daughter has a very good understanding in regards to importance of supplementation and good PO intake to support healing.  Labs reviewed. Creat: 1.27, Glucose: 112. CBGS:118.  Nutrition Focused Physical Exam:  Subcutaneous Fat:  Orbital Region: severe depletion Upper Arm Region: moderate depletion Thoracic and Lumbar Region: moderate depletion  Muscle:  Temple Region: severe depletion Clavicle Bone Region: severe depletion Clavicle and Acromion Bone Region: severe depletion Scapular Bone Region: severe depletion Dorsal Hand: WDL Patellar Region: moderate depletion Anterior Thigh Region: WDL Posterior Calf Region: WDL  Edema: none present  Height: Ht Readings from Last 1 Encounters:  03/22/14 5\' 3"  (1.6 m)    Weight: Wt Readings from Last 1 Encounters:  03/22/14 114 lb (51.71 kg)    Ideal Body Weight: 115#  % Ideal Body Weight: 99%  Wt Readings from Last 10 Encounters:  03/22/14 114 lb (51.71 kg)  11/17/13 106 lb (48.081 kg)  10/08/13 98 lb (44.453 kg)  09/22/13 95 lb 9.6 oz (43.364 kg)  08/27/13 103 lb 11.2 oz (47.038 kg)  08/25/13 104 lb (47.174 kg)  08/13/13 106 lb 8 oz (48.308 kg)  05/26/13 119 lb 12.8 oz (54.341 kg)  05/28/12 139 lb 1.6 oz (63.095 kg)  05/21/12 137 lb (62.143 kg)    Usual Body Weight: 139#  % Usual Body Weight: 82%  BMI:  Body mass index is 20.2 kg/(m^2). Normal weight range  Estimated Nutritional Needs: Kcal: 1500-1700 Protein: 62-72 grams Fluid: 1.5-1.7 L  Skin: closed abdominal  incision  Diet Order: Diet clear liquid  EDUCATION NEEDS: -Education needs addressed   Intake/Output Summary (Last 24 hours) at 03/24/14 1152 Last data filed at 03/24/14 1121  Gross per 24 hour  Intake 3642.15 ml  Output   3050 ml  Net 592.15 ml    Last BM:  03/22/14  Labs:   Recent Labs Lab 03/23/14 0446 03/24/14 0426  NA 132* 136  K 3.8 4.4  CL 106 110  CO2 21 21  BUN 15 13  CREATININE 1.30* 1.27*  CALCIUM 8.1* 8.7  GLUCOSE 150* 112*    CBG (last 3)   Recent Labs  03/23/14 0830  GLUCAP 118*    Scheduled Meds: . alvimopan  12 mg Oral BID  . cycloSPORINE  1 drop Both Eyes BID  . dorzolamide  1 drop Both Eyes BID  . enoxaparin (LOVENOX) injection  40 mg Subcutaneous Q24H  . feeding supplement (ENSURE COMPLETE)  237 mL Oral BID BM  . HYDROmorphone PCA 0.3 mg/mL   Intravenous 6 times per day  . latanoprost  1 drop Both Eyes QHS  . leflunomide  20 mg Oral Daily  . megestrol  800 mg Oral Daily  . sulfaSALAzine  1,000 mg Oral Daily  . sulfaSALAzine  500 mg Oral QPM  . traMADol  50 mg Oral 4 times per day  . traZODone  300 mg Oral QHS    Continuous Infusions: . dextrose 5 % and 0.45 % NaCl with KCl 20 mEq/L 100 mL/hr at 03/24/14 1003    Past Medical History  Diagnosis Date  . Syncope   . Uterine prolapse   . Cryptococcal pneumonitis     right lower lobe  . GERD (gastroesophageal reflux disease)   . Asthma   . IWLNLGXQ(119.4)     "monthly" (08/13/2013)  . Rheumatoid arthritis(714.0)   . Arthritis     "all over" (08/13/2013)  . Choroid melanoma of left eye   . Renal cell carcinoma     Left kidney  . Colocutaneous fistula   . Diverticulitis   . Hypertension   . Hyperlipidemia     Past Surgical History  Procedure Laterality Date  . Nephrectomy Left     native  . Bladder suspension    . Lung removal, partial    . Cholecystectomy    . Brain meningioma excision    . Loop recorder implant      Left Breast  . Colonoscopy N/A 05/21/2012    Procedure: COLONOSCOPY;  Surgeon: Arta Silence, MD;  Location: [facility];  Service: Endoscopy;  Laterality: N/A;  . Tonsillectomy    . Eye surgery Left     Choroid melanoma  . Colostomy N/A 08/28/2013    Procedure: COLOSTOMY;  Surgeon: Gwenyth Ober, MD;  Location: [facility];  Service: General;  Laterality: N/A;  . Colostomy revision N/A 08/28/2013    Procedure: COLON RESECTION SIGMOID;  Surgeon: Gwenyth Ober, MD;  Location: [place];  Service: General;  Laterality: N/A;  . Colostomy reversal N/A 03/22/2014    Procedure: REVERSAL OF COLOSTOMY;  Surgeon: Doreen Salvage, MD;  Location: [facility] OR;  Service: General;  Laterality: N/A;  . Proctoscopy N/A 03/22/2014    Procedure: RIGID PROCTOSCOPY;  Surgeon: Doreen Salvage, MD;  Location: [facility] OR;  Service: General;  Laterality: N/A;  . Abdominal hysterectomy N/A 03/22/2014    Procedure: HYSTERECTOMY ABDOMINAL;  Surgeon: Doreen Salvage, MD;  Location: [place];  Service: General;  Laterality: N/A;    Keegan Bensch A.  Jimmye Norman, RD, LDN, CDE Pager: [phone] After hours Pager: [phone]

## 2014-03-25 LAB — CBC WITH DIFFERENTIAL/PLATELET
Basophils Absolute: 0 10*3/uL (ref 0.0–0.1)
Basophils Relative: 0 % (ref 0–1)
EOS ABS: 0.2 10*3/uL (ref 0.0–0.7)
Eosinophils Relative: 2 % (ref 0–5)
HCT: 28.8 % — ABNORMAL LOW (ref 36.0–46.0)
HEMOGLOBIN: 9.5 g/dL — AB (ref 12.0–15.0)
LYMPHS PCT: 10 % — AB (ref 12–46)
Lymphs Abs: 1.1 10*3/uL (ref 0.7–4.0)
MCH: 31 pg (ref 26.0–34.0)
MCHC: 33 g/dL (ref 30.0–36.0)
MCV: 94.1 fL (ref 78.0–100.0)
MONO ABS: 0.7 10*3/uL (ref 0.1–1.0)
MONOS PCT: 6 % (ref 3–12)
Neutro Abs: 9.2 10*3/uL — ABNORMAL HIGH (ref 1.7–7.7)
Neutrophils Relative %: 83 % — ABNORMAL HIGH (ref 43–77)
PLATELETS: 200 10*3/uL (ref 150–400)
RBC: 3.06 MIL/uL — AB (ref 3.87–5.11)
RDW: 17.3 % — ABNORMAL HIGH (ref 11.5–15.5)
WBC: 11.2 10*3/uL — AB (ref 4.0–10.5)

## 2014-03-25 LAB — TYPE AND SCREEN
ABO/RH(D): O POS
Antibody Screen: NEGATIVE
Unit division: 0

## 2014-03-25 MED ORDER — OXYCODONE-ACETAMINOPHEN 5-325 MG PO TABS
1.0000 | ORAL_TABLET | ORAL | Status: DC | PRN
  Administered 2014-03-25: 1 via ORAL
  Filled 2014-03-25: qty 1

## 2014-03-25 MED ORDER — HYDROMORPHONE HCL 1 MG/ML IJ SOLN: 0.5000 mg | INTRAMUSCULAR | Status: DC | PRN

## 2014-03-25 NOTE — Progress Notes (Signed)
Physical Therapy Treatment Patient Details Name: Ashley [name] MRN: [id number] DOB: [birth year] Today's Date: 03/25/2014    History of Present Illness Patient is [name] 81 y/o female s/p reversal of colostomy, rigid proctoscopy and hysterectomy abdominal on 1/4. PMH of HTN, HLD, RA, syncope and asthma.     PT Comments    Patient progressing well with mobility. Requires Min [name] for bed mobility due to weakness through BUEs and trunk. Improved ambulation distance today with directional cues as pt with difficulty navigating small spaces and bumping into objects in room most likely secondary to visual impairment. Will need to assess ability to negotiate steps next session. Will continue to follow acutely.   Follow Up Recommendations  Home health PT;Supervision/Assistance - 24 hour     Equipment Recommendations  None recommended by PT    Recommendations for Other Services       Precautions / Restrictions Precautions Precautions: Fall Restrictions Weight Bearing Restrictions: No    Mobility  Bed Mobility Overal bed mobility: Needs Assistance Bed Mobility: Rolling;Sidelying to Sit Rolling: Min assist Sidelying to sit: Min assist       General bed mobility comments: Use of rails for support. VC's for log roll technique. Min [name] to roll and to elevate trunk. Cues for technique.  Transfers Overall transfer level: Needs assistance Equipment used: Rolling walker (2 wheeled) Transfers: Sit to/from Stand Sit to Stand: Min assist         General transfer comment: Min [name] to power up into standing and cues for positioning. Stood from [employer], from toilet x1. from chair x2. Cues for hand placement as pt wants to pull up on RW.  Ambulation/Gait Ambulation/Gait assistance: Min assist Ambulation Distance (Feet): 75 Feet (+50' +50') Assistive device: Rolling walker (2 wheeled) Gait Pattern/deviations: Step-through pattern;Decreased stride length;Trunk flexed;Shuffle   Gait velocity  interpretation: Below normal speed for age/gender General Gait Details: Pt with slow, unsteady gait. Cues for RW proximity and to stay within RW. HR increased to mid 130s. Lots of directional cues within small spaces and with obstacles due to poor vision. Multiple seated rest breaks due to fatigue.   Stairs            Wheelchair Mobility    Modified Rankin (Stroke Patients Only)       Balance Overall balance assessment: Needs assistance Sitting-balance support: Feet supported;No upper extremity supported Sitting balance-Leahy Scale: Fair Sitting balance - Comments: Able to reach outside BoS to grab toilet paper and perform pericare w/out LOB.   Standing balance support: During functional activity Standing balance-Leahy Scale: Poor                      Cognition Arousal/Alertness: Awake/alert Behavior During Therapy: WFL for tasks assessed/performed Overall Cognitive Status: Within Functional Limits for tasks assessed                      Exercises      General Comments        Pertinent Vitals/Pain Pain Assessment: No/denies pain    Home Living                      Prior Function            PT Goals (current goals can now be found in the care plan section) Progress towards PT goals: Progressing toward goals    Frequency  Min 3X/week    PT Plan Current plan remains appropriate  Co-evaluation             End of Session Equipment Utilized During Treatment: Gait belt Activity Tolerance: Patient tolerated treatment well Patient left: in chair;with call bell/phone within reach;with family/visitor present     Time: 2585-2778 PT Time Calculation (min) (ACUTE ONLY): 40 min  Charges:  $Gait Training: 23-37 mins $Therapeutic Activity: 8-22 mins                    G CodesCandy [name] [name] 2014/04/15, 4:33 PM  Ashley [name], Ashley [name], DPT [phone]

## 2014-03-25 NOTE — Progress Notes (Signed)
GS Progress Note Subjective: Patient less confused than yesterday.  Very weak.  Had two bowel movements.  Bursts of SVT throuhout yesterday.  Currently sinus and sinus tachycardia.  Objective: Vital signs in last 24 hours: Temp:  [97.5 F (36.4 C)-100 F (37.8 C)] 99.3 F (37.4 C) (01/07 0554) Pulse Rate:  [87-125] 125 (01/07 0554) Resp:  [13-18] 16 (01/07 0554) BP: (118-185)/(67-96) 159/79 mmHg (01/07 0554) SpO2:  [95 %-100 %] 99 % (01/07 0554) Last BM Date: 03/22/14  Intake/Output from previous day: 01/06 0701 - 01/07 0700 In: 2137 [P.O.:360; I.V.:1387; Blood:390] Out: 700 [Urine:700] Intake/Output this shift:    Lungs: Clear  Abd: Good bowel sounds.  Wound is clean and dry.  No infection obvious.  Extremities: No clinical signs or symptoms of DVT  Neuro: Intact.  Her AM labs are pending.  Did get one unti of blood yesterday.  Still looks pale.  May need more blood.  Lab Results: CBC   Recent Labs  03/23/14 1515 03/24/14 0426  WBC 11.2* 9.7  HGB 8.2* 7.0*  HCT 24.7* 21.2*  PLT 255 189   BMET  Recent Labs  03/23/14 0446 03/24/14 0426  NA 132* 136  K 3.8 4.4  CL 106 110  CO2 21 21  GLUCOSE 150* 112*  BUN 15 13  CREATININE 1.30* 1.27*  CALCIUM 8.1* 8.7   PT/INR No results for input(s): LABPROT, INR in the last 72 hours. ABG No results for input(s): PHART, HCO3 in the last 72 hours.  Invalid input(s): PCO2, PO2  Studies/Results: No results found.  Anti-infectives: Anti-infectives    Start     Dose/Rate Route Frequency Ordered Stop   03/22/14 1430  cefOXitin (MEFOXIN) 1 g in dextrose 5 % 50 mL IVPB     1 g100 mL/hr over 30 Minutes Intravenous 3 times per day 03/22/14 1352 03/23/14 0535   03/22/14 0600  cefoTEtan (CEFOTAN) 2 g in dextrose 5 % 50 mL IVPB     2 g100 mL/hr over 30 Minutes Intravenous On call to O.R. 03/21/14 1249 03/22/14 0753   03/21/14 1249  neomycin (MYCIFRADIN) tablet 1,000 mg  Status:  Discontinued     1,000 mg Oral 3 times  per day 03/21/14 1249 03/22/14 1256   03/21/14 1249  erythromycin (E-MYCIN) tablet 1,000 mg  Status:  Discontinued     1,000 mg Oral 3 times per day 03/21/14 1249 03/22/14 1256      Assessment/Plan: s/p Procedure(s): REVERSAL OF COLOSTOMY RIGID PROCTOSCOPY HYSTERECTOMY ABDOMINAL Advance diet Decrease IVF.  LOS: 3 days    Kathryne Eriksson. Dahlia Bailiff, MD, FACS [phone] [facility] 03/25/2014

## 2014-03-26 MED ORDER — FERROUS SULFATE 325 (65 FE) MG PO TABS
325.0000 mg | ORAL_TABLET | Freq: Every day | ORAL | Status: DC
  Administered 2014-03-26: 325 mg via ORAL
  Filled 2014-03-26: qty 1

## 2014-03-26 MED ORDER — TRAMADOL HCL 50 MG PO TABS: 50.0000 mg | ORAL_TABLET | Freq: Two times a day (BID) | ORAL | Status: DC | PRN

## 2014-03-26 MED ORDER — ONDANSETRON HCL 4 MG PO TABS: 4.0000 mg | ORAL_TABLET | Freq: Four times a day (QID) | ORAL | Status: DC | PRN

## 2014-03-26 MED ORDER — POLYETHYLENE GLYCOL 3350 17 G PO PACK: 17.0000 g | PACK | Freq: Every day | ORAL | Status: DC

## 2014-03-26 MED ORDER — OXYCODONE-ACETAMINOPHEN 5-325 MG PO TABS: 1.0000 | ORAL_TABLET | ORAL | Status: DC | PRN

## 2014-03-26 MED ORDER — SODIUM CHLORIDE 0.9 % IJ SOLN: 3.0000 mL | INTRAMUSCULAR | Status: DC | PRN

## 2014-03-26 NOTE — Progress Notes (Signed)
Ashley [name] to be D/C'd Home per MD order.  Discussed with the patient and all questions fully answered.  VSS, Surgical incision site clean, dry, intact with no sign of infection. IV catheter discontinued intact. Site without signs and symptoms of complications. Dressing and pressure applied.  An After Visit Summary was printed and given to the patient. Patient received prescriptions.  D/c education completed with patient/family including follow up instructions, medication list, d/c activities limitations if indicated, with other d/c instructions as indicated by MD - patient able to verbalize understanding, all questions fully answered.   Patient instructed to return to ED, call 911, or call MD for any changes in condition.   Patient escorted via [place], and D/C home via private auto.  [name] 03/26/2014 3:12 PM

## 2014-03-26 NOTE — Discharge Instructions (Signed)
End Colostomy Reversal Care After Refer to this sheet in the next few weeks. These instructions provide you with information on caring for yourself after your procedure. Your caregiver may also give you more specific instructions. Your treatment has been planned according to current medical practices, but problems sometimes occur. Call your caregiver if you have any problems or questions after your procedure. HOME CARE INSTRUCTIONS  Change your bandages (dressings) as directed by your caregiver.  Keep the wound clean and dry. The wound may be washed gently with soap and water. Do not rub the wound. Gently pat the wound dry with a clean towel.  Do not take baths, swim, or use hot tubs for 10 days, or as directed by your caregiver.  Only take over-the-counter or prescription medicines for pain, discomfort, or fever as directed by your caregiver.  You may resume a normal diet as directed by your caregiver.  Do not lift more than 10 pounds (4.5 kg) or play contact sports for 4 weeks, or as directed by your caregiver.  Use a stool softener if recommended by your caregiver, especially with pain medicine. SEEK MEDICAL CARE IF:  You have redness, swelling, or increasing pain in the wound.  You notice pus coming from the wound.  You have drainage from the wound lasting longer than 1 day.  You notice a bad smell coming from the wound or dressing.  Your wound breaks open.  You have persistent nausea or vomiting.  You cannot have a bowel movement.  You have pain that is not controlled with medicine. SEEK IMMEDIATE MEDICAL CARE IF:  You have a fever.  You have a rash.  You have difficulty breathing.  You have any reaction or side effects to your medicines. MAKE SURE YOU:  Understand these instructions.  Will watch your condition.  Will get help right away if you are not doing well or get worse. Document Released: 05/28/2011 Document Reviewed: 05/28/2011 [facility] Patient  Information 2015 [employer]. This information is not intended to replace advice given to you by your health care provider. Make sure you discuss any questions you have with your health care provider.

## 2014-03-26 NOTE — Progress Notes (Signed)
GS Progress Note Subjective: Patient had a good night according to her daughter who was in the room.  Has had several bowel movements, but none have been documented.  Objective: Vital signs in last 24 hours: Temp:  [98.5 F (36.9 C)-99 F (37.2 C)] 98.5 F (36.9 C) (01/08 0512) Pulse Rate:  [88-121] 92 (01/08 0512) Resp:  [16-18] 16 (01/08 0512) BP: (127-158)/(66-74) 127/66 mmHg (01/08 0512) SpO2:  [92 %-100 %] 92 % (01/08 0512) Last BM Date: 03/25/14  Intake/Output from previous day: 01/07 0701 - 01/08 0700 In: 864 [I.V.:864] Out: 1300 [Urine:1300] Intake/Output this shift: Total I/O In: 864 [I.V.:864] Out: 1000 [Urine:1000]  Lungs: Clear  Abd: Excellent bowel sounds.  Wounds are clean and dry.  Extremities: No clinical signs or symptoms of DVT  Neuro: Intact.  Seems sedate but oriented.  Lab Results: CBC   Recent Labs  03/24/14 0426 03/25/14 0700  WBC 9.7 11.2*  HGB 7.0* 9.5*  HCT 21.2* 28.8*  PLT 189 200   BMET  Recent Labs  03/24/14 0426  NA 136  K 4.4  CL 110  CO2 21  GLUCOSE 112*  BUN 13  CREATININE 1.27*  CALCIUM 8.7   PT/INR No results for input(s): LABPROT, INR in the last 72 hours. ABG No results for input(s): PHART, HCO3 in the last 72 hours.  Invalid input(s): PCO2, PO2  Studies/Results: No results found.  Anti-infectives: Anti-infectives    Start     Dose/Rate Route Frequency Ordered Stop   03/22/14 1430  cefOXitin (MEFOXIN) 1 g in dextrose 5 % 50 mL IVPB     1 g100 mL/hr over 30 Minutes Intravenous 3 times per day 03/22/14 1352 03/23/14 0535   03/22/14 0600  cefoTEtan (CEFOTAN) 2 g in dextrose 5 % 50 mL IVPB     2 g100 mL/hr over 30 Minutes Intravenous On call to O.R. 03/21/14 1249 03/22/14 0753   03/21/14 1249  neomycin (MYCIFRADIN) tablet 1,000 mg  Status:  Discontinued     1,000 mg Oral 3 times per day 03/21/14 1249 03/22/14 1256   03/21/14 1249  erythromycin (E-MYCIN) tablet 1,000 mg  Status:  Discontinued     1,000 mg  Oral 3 times per day 03/21/14 1249 03/22/14 1256      Assessment/Plan: s/p Procedure(s): REVERSAL OF COLOSTOMY RIGID PROCTOSCOPY HYSTERECTOMY ABDOMINAL Advance diet Discharge Will check this afternoon and probably allow to go home later this afternoon.  Will advance to soft diet.  DC IVF and start Tramadol.   LOS: 4 days    Kathryne Eriksson. Dahlia Bailiff, MD, FACS [phone] [facility] 03/26/2014

## 2014-03-26 NOTE — Discharge Summary (Signed)
Physician Discharge Summary  Patient ID: Ashley [name] MRN: [id number] DOB/AGE: 24-Mar-1928 81 y.o.  Admit date: 03/22/2014 Discharge date: 03/26/2014  Admission Diagnoses:  Discharge Diagnoses:  Active Problems:   Colostomy in place   Discharged Condition: good  Hospital Course: Admitted after difficult reversal of a colostomy.  Hysterectomy required.    Consults: None  Significant Diagnostic Studies: labs: CBC and Bmet  Treatments: IV hydration, antibiotics: Cefoxitin and analgesia: Dilaudid and tramadol and percocet  Discharge Exam: Blood pressure 127/66, pulse 92, temperature 98.5 F (36.9 C), temperature source Oral, resp. rate 16, height 5\' 3"  (1.6 m), weight 51.71 kg (114 lb), SpO2 92 %. General appearance: alert, cachectic, fatigued, no distress and pale Resp: clear to auscultation bilaterally GI: soft, non-tender; bowel sounds normal; no masses,  no organomegaly and incisions are clean and dry.  No drainage.  Some blood in stools.  Disposition: Home with daughter there for continuous assistance  Discharge Instructions    Call MD for:  difficulty breathing, headache or visual disturbances    Complete by:  As directed      Call MD for:  extreme fatigue    Complete by:  As directed      Call MD for:  hives    Complete by:  As directed      Call MD for:  persistant dizziness or light-headedness    Complete by:  As directed      Call MD for:  persistant nausea and vomiting    Complete by:  As directed      Call MD for:  redness, tenderness, or signs of infection (pain, swelling, redness, odor or green/yellow discharge around incision site)    Complete by:  As directed      Call MD for:  severe uncontrolled pain    Complete by:  As directed      Call MD for:  temperature >100.4    Complete by:  As directed      Diet - low sodium heart healthy    Complete by:  As directed      Discharge instructions    Complete by:  As directed   See specific post-colectomy  instructions     Increase activity slowly    Complete by:  As directed      Lifting restrictions    Complete by:  As directed   No lifting >20 pounds for the next 6 weeks.     No dressing needed    Complete by:  As directed   May shower and pat wound dry.  Cover if there is drainage.            Medication List    TAKE these medications        cycloSPORINE 0.05 % ophthalmic emulsion  Commonly known as:  RESTASIS  Place 1 drop into both eyes 2 (two) times daily.     dorzolamide 2 % ophthalmic solution  Commonly known as:  TRUSOPT  Place 1 drop into both eyes 2 (two) times daily.     feeding supplement (ENSURE COMPLETE) Liqd  Take 237 mLs by mouth 2 (two) times daily between meals.     fenofibrate 160 MG tablet  Take 160 mg by mouth daily.     ferrous sulfate 325 (65 FE) MG tablet  Take 325 mg by mouth daily with breakfast.     hydrochlorothiazide 12.5 MG capsule  Commonly known as:  MICROZIDE  Take 1 capsule (12.5 mg total) by mouth daily.  latanoprost 0.005 % ophthalmic solution  Commonly known as:  XALATAN  Place 1 drop into both eyes at bedtime.     leflunomide 20 MG tablet  Commonly known as:  ARAVA  Take 20 mg by mouth daily.     megestrol 625 MG/5ML suspension  Commonly known as:  MEGACE ES  Take 625 mg by mouth daily.     omega-3 acid ethyl esters 1 G capsule  Commonly known as:  LOVAZA  Take 1 g by mouth daily.     ondansetron 4 MG tablet  Commonly known as:  ZOFRAN  Take 1 tablet (4 mg total) by mouth every 6 (six) hours as needed for nausea.     oxyCODONE-acetaminophen 5-325 MG per tablet  Commonly known as:  PERCOCET/ROXICET  Take 1 tablet by mouth every 4 (four) hours as needed for moderate pain.     polyethylene glycol packet  Commonly known as:  MIRALAX / GLYCOLAX  Take 17 g by mouth daily.     sulfaSALAzine 500 MG EC tablet  Commonly known as:  AZULFIDINE  Take 500-1,000 mg by mouth 2 (two) times daily. 2 qam 1 qpm     traMADol 50  MG tablet  Commonly known as:  ULTRAM  Take 1-2 tablets (50-100 mg total) by mouth every 12 (twelve) hours as needed for moderate pain or severe pain.     traZODone 150 MG tablet  Commonly known as:  DESYREL  Take 300 mg by mouth at bedtime.     Vitamin D-3 5000 UNITS Tabs  Take 5,000 Units by mouth daily.           Follow-up Information    Follow up with Carlene Coria, [place]. Go on 03/31/2014.   Specialty:  General Surgery   Why:  For wound re-check, For suture removal.  Appointment scheduled for 3:00PM      Follow up with Enna Warwick, JAY, MD In 2 weeks.   Specialty:  General Surgery   Contact information:   [address] [place] [address] [phone]       Signed: Doreen Salvage 03/26/2014, 2:54 PM

## 2014-03-26 NOTE — Progress Notes (Signed)
Physical Therapy Treatment Patient Details Name: Ashley [name] MRN: [id number] DOB: 05/04/1928 Today's Date: 03/26/2014    History of Present Illness Patient is a 81 y/o female s/p reversal of colostomy, rigid proctoscopy and hysterectomy abdominal on 1/4. PMH of HTN, HLD, RA, syncope and asthma.     PT Comments    Pt. Would benefit form more gait training in order to handle small spaces and need for directional training. Pt. Would benefit from using a SPC as that is what she'll be using at home upon discharge.    Follow Up Recommendations  Home health PT;Supervision/Assistance - 24 hour     Equipment Recommendations  None recommended by PT    Recommendations for Other Services OT consult     Precautions / Restrictions Precautions Precautions: Fall Restrictions Weight Bearing Restrictions: No    Mobility  Bed Mobility               General bed mobility comments: up in chair upon arrival  Transfers Overall transfer level: Modified independent Equipment used: Rolling walker (2 wheeled) Transfers: Sit to/from Stand Sit to Stand: Min assist         General transfer comment: cues for hand placement as pt wants to pull up on RW. Min A to power up into standing and cues for positioning.  Ambulation/Gait Ambulation/Gait assistance: Min assist Ambulation Distance (Feet): 75 Feet Assistive device: Rolling walker (2 wheeled) Gait Pattern/deviations: Step-through pattern;Decreased stride length;Trunk flexed;Shuffle;Drifts right/left   Gait velocity interpretation: Below normal speed for age/gender General Gait Details: Pt with slow, unsteady gait. Cues for RW proximity and to stay within RW.  Lots of directional cues within small spaces and with obstacles due to poor vision.    Stairs Stairs: Yes Stairs assistance: Mod assist Stair Management: One rail Right;Forwards;Step to pattern Number of Stairs: 4 General stair comments: Pt. was pulling up with to hands to A.  needed cues to put her whole foot on the stairs. She did one step with a step through pattern  Wheelchair Mobility    Modified Rankin (Stroke Patients Only)       Balance                                    Cognition Arousal/Alertness: Awake/alert Behavior During Therapy: WFL for tasks assessed/performed Overall Cognitive Status: Within Functional Limits for tasks assessed                      Exercises      General Comments        Pertinent Vitals/Pain Pain Assessment: No/denies pain Pain Score: 0-No pain    Home Living                      Prior Function            PT Goals (current goals can now be found in the care plan section) Progress towards PT goals: Progressing toward goals    Frequency  Min 3X/week    PT Plan Current plan remains appropriate    Co-evaluation             End of Session Equipment Utilized During Treatment: Gait belt Activity Tolerance: Patient tolerated treatment well Patient left: in chair;with call bell/phone within reach;with family/visitor present     Time: 1110-1130 PT Time Calculation (min) (ACUTE ONLY): 20 min  Charges:  G Codes:      Jodi Geralds, [place] 03/26/2014, 12:23 PM

## 2014-04-06 ENCOUNTER — Other Ambulatory Visit: Payer: Self-pay | Admitting: General Surgery

## 2014-04-06 DIAGNOSIS — E860 Dehydration: Secondary | ICD-10-CM | POA: Diagnosis not present

## 2014-04-06 LAB — CBC WITH DIFFERENTIAL/PLATELET
Basophils Absolute: 0 10*3/uL (ref 0.0–0.1)
Eosinophils Absolute: 0 10*3/uL (ref 0.0–0.7)
HCT: 34.2 % — ABNORMAL LOW (ref 36.0–46.0)
Hemoglobin: 10.3 g/dL — ABNORMAL LOW (ref 12.0–15.0)
Lymphocytes Relative: 43 % (ref 12–46)
Lymphs Abs: 3.5 10*3/uL (ref 0.7–4.0)
MCH: 30.6 pg (ref 26.0–34.0)
MCHC: 30.1 g/dL (ref 30.0–36.0)
MCV: 101.4 fL — ABNORMAL HIGH (ref 78.0–100.0)
Monocytes Absolute: 0.4 10*3/uL (ref 0.1–1.0)
Monocytes Relative: 5 % (ref 3–12)
Neutro Abs: 4.3 10*3/uL (ref 1.7–7.7)
Neutrophils Relative %: 52 % (ref 43–77)
Platelets: 505 10*3/uL — ABNORMAL HIGH (ref 150–400)
RBC: 3.37 MIL/uL — ABNORMAL LOW (ref 3.87–5.11)
RDW: 19.2 % — ABNORMAL HIGH (ref 11.5–15.5)
WBC: 8.2 10*3/uL (ref 4.0–10.5)

## 2014-04-06 LAB — COMPREHENSIVE METABOLIC PANEL
ALT: 14 U/L (ref 0–35)
AST: 31 U/L (ref 0–37)
Albumin: 3.6 g/dL (ref 3.5–5.2)
Alkaline Phosphatase: 32 U/L — ABNORMAL LOW (ref 39–117)
BUN: 22 mg/dL (ref 6–23)
CO2: 17 meq/L — AB (ref 19–32)
CREATININE: 1 mg/dL (ref 0.50–1.10)
Calcium: 10.9 mg/dL — ABNORMAL HIGH (ref 8.4–10.5)
Chloride: 106 mEq/L (ref 96–112)
Glucose, Bld: 121 mg/dL — ABNORMAL HIGH (ref 70–99)
Potassium: 4 mEq/L (ref 3.5–5.3)
SODIUM: 143 meq/L (ref 135–145)
TOTAL PROTEIN: 7 g/dL (ref 6.0–8.3)
Total Bilirubin: 0.7 mg/dL (ref 0.2–1.2)

## 2014-05-03 DIAGNOSIS — N289 Disorder of kidney and ureter, unspecified: Secondary | ICD-10-CM | POA: Diagnosis not present

## 2014-05-03 DIAGNOSIS — D649 Anemia, unspecified: Secondary | ICD-10-CM | POA: Diagnosis not present

## 2014-05-03 DIAGNOSIS — E782 Mixed hyperlipidemia: Secondary | ICD-10-CM | POA: Diagnosis not present

## 2014-05-03 DIAGNOSIS — Z1389 Encounter for screening for other disorder: Secondary | ICD-10-CM | POA: Diagnosis not present

## 2014-05-03 DIAGNOSIS — I4891 Unspecified atrial fibrillation: Secondary | ICD-10-CM | POA: Diagnosis not present

## 2014-05-03 DIAGNOSIS — Z23 Encounter for immunization: Secondary | ICD-10-CM | POA: Diagnosis not present

## 2014-05-03 DIAGNOSIS — G4700 Insomnia, unspecified: Secondary | ICD-10-CM | POA: Diagnosis not present

## 2014-05-19 ENCOUNTER — Ambulatory Visit: Payer: Self-pay | Admitting: Cardiology

## 2014-05-19 DIAGNOSIS — I482 Chronic atrial fibrillation, unspecified: Secondary | ICD-10-CM

## 2014-05-19 DIAGNOSIS — Z5181 Encounter for therapeutic drug level monitoring: Secondary | ICD-10-CM

## 2014-05-20 DIAGNOSIS — M0589 Other rheumatoid arthritis with rheumatoid factor of multiple sites: Secondary | ICD-10-CM | POA: Diagnosis not present

## 2014-05-20 DIAGNOSIS — M949 Disorder of cartilage, unspecified: Secondary | ICD-10-CM | POA: Diagnosis not present

## 2014-05-20 DIAGNOSIS — M7550 Bursitis of unspecified shoulder: Secondary | ICD-10-CM | POA: Diagnosis not present

## 2014-05-20 DIAGNOSIS — M1990 Unspecified osteoarthritis, unspecified site: Secondary | ICD-10-CM | POA: Diagnosis not present

## 2014-05-21 ENCOUNTER — Ambulatory Visit (INDEPENDENT_AMBULATORY_CARE_PROVIDER_SITE_OTHER): Payer: Medicare Other | Admitting: Cardiology

## 2014-05-21 ENCOUNTER — Other Ambulatory Visit: Payer: Self-pay | Admitting: *Deleted

## 2014-05-21 ENCOUNTER — Encounter: Payer: Self-pay | Admitting: Cardiology

## 2014-05-21 VITALS — BP 108/70 | HR 77 | Ht 63.0 in | Wt 111.0 lb

## 2014-05-21 DIAGNOSIS — E43 Unspecified severe protein-calorie malnutrition: Secondary | ICD-10-CM

## 2014-05-21 DIAGNOSIS — I10 Essential (primary) hypertension: Secondary | ICD-10-CM | POA: Diagnosis not present

## 2014-05-21 DIAGNOSIS — I471 Supraventricular tachycardia: Secondary | ICD-10-CM

## 2014-05-21 NOTE — Patient Instructions (Signed)
Please do not take Hydrochlorothiazide anymore. Continue all other medications as listed.  Follow up in 6 months with Dr. Marlou Porch.  You will receive a letter in the mail 2 months before you are due.  Please call us when you receive this letter to schedule your follow up appointment.  Thank you for choosing [facility]!!

## 2014-05-21 NOTE — Progress Notes (Signed)
[facility]. [address], [address] [place], [place]  [address] Phone: [phone] Fax:  [phone]  Date:  05/21/2014   ID:  Ashley [name], DOB Nov 26, 1928, MRN [id number]  PCP:  Leonides Sake, MD   History of Present Illness: Ashley [name] is a 81 y.o. female on chronic anticoagulation with atrial fibrillation here for six-month followup. She has seen Dr. Cristopher Peru in the past with EP. Prior implantable loop recorder and 2011 by Dr. Leonia Reeves. She most recently saw Dr. Lovena Le on 05/26/13 who suspected autonomic dysfunction. She has had prolonged pauses which occurred during the middle of the night. She is also had asymptomatic SVT. No obvious correlation with arrhythmia to her symptoms.  She underwent colectomy by Dr. Hulen Skains. Dr. Hulen Skains reversed colostomy.  Overall she is doing well, trying to increase her weight which has been successful by meat skins. She occasionally will feel some mild dizziness when standing up but otherwise is doing quite well. No further syncopal episodes.    Wt Readings from Last 3 Encounters:  05/21/14 111 lb (50.349 kg)  03/22/14 114 lb (51.71 kg)  11/17/13 106 lb (48.081 kg)     Past Medical History  Diagnosis Date  . Syncope   . Uterine prolapse   . Cryptococcal pneumonitis     right lower lobe  . GERD (gastroesophageal reflux disease)   . Asthma   . VXBLTJQZ(009.2)     "monthly" (08/13/2013)  . Rheumatoid arthritis(714.0)   . Arthritis     "all over" (08/13/2013)  . Choroid melanoma of left eye   . Renal cell carcinoma     Left kidney  . Colocutaneous fistula   . Diverticulitis   . Hypertension   . Hyperlipidemia     Past Surgical History  Procedure Laterality Date  . Nephrectomy Left     native  . Bladder suspension    . Lung removal, partial    . Cholecystectomy    . Brain meningioma excision    . Loop recorder implant      Left Breast  . Colonoscopy N/A 05/21/2012    Procedure: COLONOSCOPY;  Surgeon: Arta Silence, MD;   Location: [facility];  Service: Endoscopy;  Laterality: N/A;  . Tonsillectomy    . Eye surgery Left     Choroid melanoma  . Colostomy N/A 08/28/2013    Procedure: COLOSTOMY;  Surgeon: Gwenyth Ober, MD;  Location: [facility];  Service: General;  Laterality: N/A;  . Colostomy revision N/A 08/28/2013    Procedure: COLON RESECTION SIGMOID;  Surgeon: Gwenyth Ober, MD;  Location: [facility];  Service: General;  Laterality: N/A;  . Colostomy reversal N/A 03/22/2014    Procedure: REVERSAL OF COLOSTOMY;  Surgeon: Doreen Salvage, MD;  [facility];  Service: General;  Laterality: N/A;  . Proctoscopy N/A 03/22/2014    Procedure: RIGID PROCTOSCOPY;  Surgeon: Doreen Salvage, MD;  Location: [place];  Service: General;  Laterality: N/A;  . Abdominal hysterectomy N/A 03/22/2014    Procedure: HYSTERECTOMY ABDOMINAL;  Surgeon: Doreen Salvage, MD;  Location: [place];  Service: General;  Laterality: N/A;    Current Outpatient Prescriptions  Medication Sig Dispense Refill  . Cholecalciferol (VITAMIN D-3) 5000 UNITS TABS Take 5,000 Units by mouth daily.     . cycloSPORINE (RESTASIS) 0.05 % ophthalmic emulsion Place 1 drop into both eyes 2 (two) times daily.      . dorzolamide (TRUSOPT) 2 % ophthalmic solution Place 1 drop into both eyes  2 (two) times daily.     . feeding supplement, ENSURE COMPLETE, (ENSURE COMPLETE) LIQD Take 237 mLs by mouth 2 (two) times daily between meals. 60 Bottle 5  . fenofibrate 160 MG tablet Take 160 mg by mouth daily.    . ferrous sulfate 325 (65 FE) MG tablet Take 325 mg by mouth daily with breakfast.    . hydrochlorothiazide (MICROZIDE) 12.5 MG capsule Take 1 capsule (12.5 mg total) by mouth daily. 90 capsule 0  . latanoprost (XALATAN) 0.005 % ophthalmic solution Place 1 drop into both eyes at bedtime.    Marland Kitchen leflunomide (ARAVA) 20 MG tablet Take 20 mg by mouth daily.    . megestrol (MEGACE ES) 625 MG/5ML suspension Take 625 mg by mouth daily.    Marland Kitchen omega-3 acid ethyl esters (LOVAZA) 1 G capsule Take 1 g by mouth  daily.     . ondansetron (ZOFRAN) 4 MG tablet Take 1 tablet (4 mg total) by mouth every 6 (six) hours as needed for nausea. 20 tablet 0  . oxyCODONE-acetaminophen (PERCOCET/ROXICET) 5-325 MG per tablet Take 1 tablet by mouth every 4 (four) hours as needed for moderate pain. 30 tablet 0  . polyethylene glycol (MIRALAX / GLYCOLAX) packet Take 17 g by mouth daily. (Patient taking differently: Take 17 g by mouth daily as needed for mild constipation. ) 14 each 0  . sulfaSALAzine (AZULFIDINE) 500 MG EC tablet Take 500-1,000 mg by mouth 2 (two) times daily. 2 qam 1 qpm    . traMADol (ULTRAM) 50 MG tablet Take 1-2 tablets (50-100 mg total) by mouth every 12 (twelve) hours as needed for moderate pain or severe pain. 30 tablet 0  . traZODone (DESYREL) 150 MG tablet Take 300 mg by mouth at bedtime.     No current facility-administered medications for this visit.    Allergies:   No Known Allergies  Social History:  The patient  reports that she has never smoked. She has never used smokeless tobacco. She reports that she does not drink alcohol or use illicit drugs.   Family History  Problem Relation Age of Onset  . Heart disease Maternal Aunt     ROS:  Please see the history of present illness.   Weakness, denies any chest pain, recent syncope, shortness of breath   All other systems reviewed and negative.   PHYSICAL EXAM: VS:  BP 108/70 mmHg  Pulse 77  Ht 5\' 3"  (1.6 m)  Wt 111 lb (50.349 kg)  BMI 19.67 kg/m2 Thin in no acute distress HEENT: normal, [place]/AT, EOMI Neck: no JVD, normal carotid upstroke, no bruit Cardiac:  normal S1, S2; RRR; no murmur Lungs:  clear to auscultation bilaterally, no wheezing, rhonchi or rales Abd: soft, nontender, no hepatomegaly, no bruits, ostomy Ext: no edema, 2+ distal pulses Skin: warm and dry GU: deferred Neuro: no focal abnormalities noted, AAO x 3  EKG:  None today. Implantable loop recorder reviewed.  ASSESSMENT AND PLAN:  1. Hypertension-we're going  to stop the HCTZ given her low blood pressures. 2. Implantable loop recorder-reviewed Dr. Tanna Furry last note. SVT noted. Nocturnal bradycardia. Syncope previously likely from autonomic dysfunction. Been an episode of atrial fibrillation in March of 2015, brief, 4 minutes. I reviewed Dr. Ron Parker this consultation on 09/03/13. Her risk of bleeding is quite high given her thin body habitus, risk of falling. If atrial fibrillation returns, more lengthy episodes, we will revisit anticoagulation. For now risk of anticoagulation outweighs benefit. I discussed with family. They understand. They understand  risk of stroke. 3. Syncope - no further episodes. Likely autonomic. 4. Malnutrition-continue to encourage protein. 5. 6 month follow up.  Signed, Candee Furbish, MD [facility]  05/21/2014 10:07 AM

## 2014-05-25 DIAGNOSIS — R6251 Failure to thrive (child): Secondary | ICD-10-CM | POA: Diagnosis not present

## 2014-05-25 DIAGNOSIS — R64 Cachexia: Secondary | ICD-10-CM | POA: Diagnosis not present

## 2014-06-29 ENCOUNTER — Other Ambulatory Visit: Payer: Self-pay | Admitting: *Deleted

## 2014-06-29 ENCOUNTER — Other Ambulatory Visit: Payer: Self-pay | Admitting: General Surgery

## 2014-06-29 DIAGNOSIS — H409 Unspecified glaucoma: Secondary | ICD-10-CM | POA: Diagnosis not present

## 2014-06-29 DIAGNOSIS — R1904 Left lower quadrant abdominal swelling, mass and lump: Secondary | ICD-10-CM | POA: Diagnosis not present

## 2014-06-29 DIAGNOSIS — R109 Unspecified abdominal pain: Secondary | ICD-10-CM | POA: Diagnosis not present

## 2014-06-29 DIAGNOSIS — R1900 Intra-abdominal and pelvic swelling, mass and lump, unspecified site: Secondary | ICD-10-CM

## 2014-06-29 DIAGNOSIS — H4011X0 Primary open-angle glaucoma, stage unspecified: Secondary | ICD-10-CM | POA: Diagnosis not present

## 2014-06-29 DIAGNOSIS — R1032 Left lower quadrant pain: Secondary | ICD-10-CM | POA: Diagnosis not present

## 2014-06-29 DIAGNOSIS — H4720 Unspecified optic atrophy: Secondary | ICD-10-CM | POA: Diagnosis not present

## 2014-06-29 DIAGNOSIS — D329 Benign neoplasm of meninges, unspecified: Secondary | ICD-10-CM | POA: Diagnosis not present

## 2014-06-30 DIAGNOSIS — R109 Unspecified abdominal pain: Secondary | ICD-10-CM | POA: Diagnosis not present

## 2014-07-02 ENCOUNTER — Ambulatory Visit
Admission: RE | Admit: 2014-07-02 | Discharge: 2014-07-02 | Disposition: A | Discharge status: Home / Self Care | Payer: Medicare Other | Source: Ambulatory Visit | Attending: General Surgery | Admitting: General Surgery

## 2014-07-02 DIAGNOSIS — K6389 Other specified diseases of intestine: Secondary | ICD-10-CM | POA: Diagnosis not present

## 2014-07-02 DIAGNOSIS — Z933 Colostomy status: Secondary | ICD-10-CM | POA: Diagnosis not present

## 2014-07-02 MED ORDER — IOPAMIDOL (ISOVUE-300) INJECTION 61%
100.0000 mL | Freq: Once | INTRAVENOUS | Status: AC | PRN
  Administered 2014-07-02: 100 mL via INTRAVENOUS

## 2014-07-19 DIAGNOSIS — Z681 Body mass index (BMI) 19 or less, adult: Secondary | ICD-10-CM | POA: Diagnosis not present

## 2014-07-19 DIAGNOSIS — R918 Other nonspecific abnormal finding of lung field: Secondary | ICD-10-CM | POA: Diagnosis not present

## 2014-07-20 ENCOUNTER — Other Ambulatory Visit: Payer: Self-pay | Admitting: Family Medicine

## 2014-07-20 DIAGNOSIS — R918 Other nonspecific abnormal finding of lung field: Secondary | ICD-10-CM

## 2014-07-21 ENCOUNTER — Ambulatory Visit
Admission: RE | Admit: 2014-07-21 | Discharge: 2014-07-21 | Disposition: A | Discharge status: Home / Self Care | Payer: Medicare Other | Source: Ambulatory Visit | Attending: Family Medicine | Admitting: Family Medicine

## 2014-07-21 DIAGNOSIS — K6389 Other specified diseases of intestine: Secondary | ICD-10-CM | POA: Diagnosis not present

## 2014-07-21 DIAGNOSIS — R918 Other nonspecific abnormal finding of lung field: Secondary | ICD-10-CM | POA: Diagnosis not present

## 2014-08-24 DIAGNOSIS — M159 Polyosteoarthritis, unspecified: Secondary | ICD-10-CM | POA: Diagnosis not present

## 2014-08-24 DIAGNOSIS — M7550 Bursitis of unspecified shoulder: Secondary | ICD-10-CM | POA: Diagnosis not present

## 2014-08-24 DIAGNOSIS — M1990 Unspecified osteoarthritis, unspecified site: Secondary | ICD-10-CM | POA: Diagnosis not present

## 2014-08-24 DIAGNOSIS — M0589 Other rheumatoid arthritis with rheumatoid factor of multiple sites: Secondary | ICD-10-CM | POA: Diagnosis not present

## 2014-09-02 ENCOUNTER — Other Ambulatory Visit: Payer: Self-pay | Admitting: Internal Medicine

## 2014-09-07 NOTE — Patient Outreach (Signed)
[place] [facility]) Care Management  09/07/2014  CHANTRICE HAGG [birth year] [id number]   Referral from [place] List, assigned [name], RN to outreach.  [name]. [place], [place] Management [place] Assistant Phone: [phone] Fax: [phone]

## 2014-10-04 ENCOUNTER — Other Ambulatory Visit: Payer: Self-pay

## 2014-10-04 NOTE — Patient Outreach (Signed)
[place]) Care Management  10/04/2014  Ashley [name] 07-18-1928 [id number]   Telephone call to patient regarding high risk list referral.  HIPAA verified with patient.  Discussed and offered [facility] care management services to patient  Patient refused services at this time.  Patient requested additional information regarding [facility] care management. Patient verified primary MD as Dr. Daiva Eves.   PLAN: RNCM will refer to Lurline Del to close patient due to refusal of services.  RNCM will notify patients primary MD of refusal of services. RNCM will send patient [name] outreach letter and pamphlet as requested.   [name] RN,BSN,CCM [place] Coordinator [phone]

## 2014-10-08 NOTE — Patient Outreach (Signed)
[place] [facility]) Care Management  10/08/2014  Ashley [name] 11-09-1928 [id number]   Notification from [name], RN to close case due to patient refused [place] Management services.  [name]. [place], [place] Management [place] Assistant Phone: [phone] Fax: [phone]

## 2014-10-26 DIAGNOSIS — H409 Unspecified glaucoma: Secondary | ICD-10-CM | POA: Diagnosis not present

## 2014-10-26 DIAGNOSIS — D329 Benign neoplasm of meninges, unspecified: Secondary | ICD-10-CM | POA: Diagnosis not present

## 2014-10-26 DIAGNOSIS — H4720 Unspecified optic atrophy: Secondary | ICD-10-CM | POA: Diagnosis not present

## 2014-11-02 ENCOUNTER — Other Ambulatory Visit: Payer: Self-pay | Admitting: Family Medicine

## 2014-11-02 DIAGNOSIS — D649 Anemia, unspecified: Secondary | ICD-10-CM | POA: Diagnosis not present

## 2014-11-02 DIAGNOSIS — E782 Mixed hyperlipidemia: Secondary | ICD-10-CM | POA: Diagnosis not present

## 2014-11-02 DIAGNOSIS — Z9181 History of falling: Secondary | ICD-10-CM | POA: Diagnosis not present

## 2014-11-02 DIAGNOSIS — Z681 Body mass index (BMI) 19 or less, adult: Secondary | ICD-10-CM | POA: Diagnosis not present

## 2014-11-02 DIAGNOSIS — Z1231 Encounter for screening mammogram for malignant neoplasm of breast: Secondary | ICD-10-CM | POA: Diagnosis not present

## 2014-11-02 DIAGNOSIS — R202 Paresthesia of skin: Secondary | ICD-10-CM | POA: Diagnosis not present

## 2014-11-02 DIAGNOSIS — K219 Gastro-esophageal reflux disease without esophagitis: Secondary | ICD-10-CM | POA: Diagnosis not present

## 2014-11-02 DIAGNOSIS — I4891 Unspecified atrial fibrillation: Secondary | ICD-10-CM | POA: Diagnosis not present

## 2014-11-02 DIAGNOSIS — Z1389 Encounter for screening for other disorder: Secondary | ICD-10-CM | POA: Diagnosis not present

## 2014-11-02 DIAGNOSIS — Z79899 Other long term (current) drug therapy: Secondary | ICD-10-CM | POA: Diagnosis not present

## 2014-11-02 DIAGNOSIS — G4700 Insomnia, unspecified: Secondary | ICD-10-CM | POA: Diagnosis not present

## 2014-11-17 ENCOUNTER — Ambulatory Visit (INDEPENDENT_AMBULATORY_CARE_PROVIDER_SITE_OTHER): Payer: Medicare Other | Admitting: Cardiology

## 2014-11-17 ENCOUNTER — Encounter: Payer: Self-pay | Admitting: Cardiology

## 2014-11-17 VITALS — BP 130/68 | HR 67 | Ht 64.0 in | Wt 109.2 lb

## 2014-11-17 DIAGNOSIS — R55 Syncope and collapse: Secondary | ICD-10-CM

## 2014-11-17 DIAGNOSIS — I10 Essential (primary) hypertension: Secondary | ICD-10-CM | POA: Diagnosis not present

## 2014-11-17 DIAGNOSIS — E43 Unspecified severe protein-calorie malnutrition: Secondary | ICD-10-CM | POA: Diagnosis not present

## 2014-11-17 NOTE — Patient Instructions (Addendum)
Medication Instructions:  Stop your Lovaza. Continue all other medications as listed.  Follow-Up: Follow up in 1 year with Dr. Marlou Porch.  You will receive a letter in the mail 2 months before you are due.  Please call us when you receive this letter to schedule your follow up appointment.  Thank you for choosing [facility]!!

## 2014-11-17 NOTE — Progress Notes (Signed)
[facility]. [address] [place], [place]  [address] Phone: [phone] Fax:  [phone]  Date:  11/17/2014   ID:  Ashley [name], DOB [birth year], MRN [id number]  PCP:  Leonides Sake, MD   History of Present Illness: Ashley [name] is a 81 y.o. female  With prior unexplained syncope , malnutrition , hypertension , hypertriglyceridemia here for follow-up. She has seen Dr. Cristopher Peru in the past with EP. Prior implantable loop recorder and 2011 by Dr. Leonia Reeves. She most recently saw Dr. Lovena Le on 05/26/13 who suspected autonomic dysfunction. She has had prolonged pauses which occurred during the middle of the night. She is also had asymptomatic SVT. No obvious correlation with arrhythmia to her symptoms.  She underwent colectomy by Dr. Hulen . Dr. Hulen  reversed colostomy.  Overall she is doing well. She occasionally will feel some mild dizziness when standing up but otherwise is doing quite well. No further syncopal episodes. Remains without appetite.    Wt Readings from Last 3 Encounters:  11/17/14 109 lb 4 oz (49.555 kg)  05/21/14 111 lb (50.349 kg)  03/22/14 114 lb (51.71 kg)     Past Medical History  Diagnosis Date  . Syncope   . Uterine prolapse   . Cryptococcal pneumonitis     right lower lobe  . GERD (gastroesophageal reflux disease)   . Asthma   . NATFTDDU(202.5)     "monthly" (08/13/2013)  . Rheumatoid arthritis(714.0)   . Arthritis     "all over" (08/13/2013)  . Choroid melanoma of left eye   . Renal cell carcinoma     Left kidney  . Colocutaneous fistula   . Diverticulitis   . Hypertension   . Hyperlipidemia     Past Surgical History  Procedure Laterality Date  . Nephrectomy Left     native  . Bladder suspension    . Lung removal, partial    . Cholecystectomy    . Brain meningioma excision    . Loop recorder implant      Left Breast  . Colonoscopy N/A 05/21/2012    Procedure: COLONOSCOPY;  Surgeon: Arta Silence, MD;  [facility];  Service: Endoscopy;  Laterality: N/A;  . Tonsillectomy    . Eye surgery Left     Choroid melanoma  . Colostomy N/A 08/28/2013    Procedure: COLOSTOMY;  Surgeon: Gwenyth Ober, MD;  Location: [facility];  Service: General;  Laterality: N/A;  . Colostomy revision N/A 08/28/2013    Procedure: COLON RESECTION SIGMOID;  Surgeon: Gwenyth Ober, MD;  Location: [place];  Service: General;  Laterality: N/A;  . Colostomy reversal N/A 03/22/2014    Procedure: REVERSAL OF COLOSTOMY;  Surgeon: Doreen Salvage, MD;  Location: [facility] OR;  Service: General;  Laterality: N/A;  . Proctoscopy N/A 03/22/2014    Procedure: RIGID PROCTOSCOPY;  Surgeon: Doreen Salvage, MD;  Location: [place];  Service: General;  Laterality: N/A;  . Abdominal hysterectomy N/A 03/22/2014    Procedure: HYSTERECTOMY ABDOMINAL;  Surgeon: Doreen Salvage, MD;  Location: [facility];  Service: General;  Laterality: N/A;    Current Outpatient Prescriptions  Medication Sig Dispense Refill  . Cholecalciferol (VITAMIN D-3) 5000 UNITS TABS Take 5,000 Units by mouth daily.     . cycloSPORINE (RESTASIS) 0.05 % ophthalmic emulsion Place 1 drop into both eyes 2 (two) times daily.      . dorzolamide (TRUSOPT) 2 % ophthalmic solution Place 1 drop into both eyes 2 (two) times  daily.     . feeding supplement, ENSURE COMPLETE, (ENSURE COMPLETE) LIQD Take 237 mLs by mouth 2 (two) times daily between meals. 60 Bottle 5  . fenofibrate 160 MG tablet Take 160 mg by mouth daily.    . ferrous sulfate 325 (65 FE) MG tablet Take 325 mg by mouth daily with breakfast.    . lactose free nutrition (BOOST) LIQD Take 237 mLs by mouth daily.    Marland Kitchen latanoprost (XALATAN) 0.005 % ophthalmic solution Place 1 drop into both eyes at bedtime.    Marland Kitchen leflunomide (ARAVA) 20 MG tablet Take 20 mg by mouth daily.    . megestrol (MEGACE ES) 625 MG/5ML suspension Take 625 mg by mouth daily.    Marland Kitchen sulfaSALAzine (AZULFIDINE) 500 MG EC tablet Take 500-1,000 mg by mouth 2 (two) times daily. 2 qam 1 qpm    . traMADol  (ULTRAM) 50 MG tablet Take 1-2 tablets (50-100 mg total) by mouth every 12 (twelve) hours as needed for moderate pain or severe pain. 30 tablet 0  . traZODone (DESYREL) 150 MG tablet Take 300 mg by mouth at bedtime.    . polyethylene glycol (MIRALAX / GLYCOLAX) packet Take 17 g by mouth daily. (Patient not taking: Reported on 11/17/2014) 14 each 0   No current facility-administered medications for this visit.    Allergies:   No Known Allergies  Social History:  The patient  reports that she has never smoked. She has never used smokeless tobacco. She reports that she does not drink alcohol or use illicit drugs.   Family History  Problem Relation Age of Onset  . Heart disease Maternal Aunt     Family hx unk did not live with family    ROS:  Please see the history of present illness.   Weakness, denies any chest pain, recent syncope, shortness of breath   All other systems reviewed and negative.   PHYSICAL EXAM: VS:  BP 130/68 mmHg  Pulse 67  Ht 5\' 4"  (1.626 m)  Wt 109 lb 4 oz (49.555 kg)  BMI 18.74 kg/m2  SpO2 99% Thin in no acute distress HEENT: normal, [place]/AT, EOMI Neck: no JVD, normal carotid upstroke, no bruit Cardiac:  normal S1, S2; RRR; no murmur Lungs:  clear to auscultation bilaterally, no wheezing, rhonchi or rales Abd: soft, nontender, no hepatomegaly, no bruits, ostomy Ext: no edema, 2+ distal pulses Skin: warm and dry GU: deferred Neuro: no focal abnormalities noted, AAO x 3  EKG:  None today. Implantable loop recorder reviewed.  ASSESSMENT AND PLAN:  1. Hypertension- no longer on HCTZ given her low blood pressures. Overall doing well. 2. Implantable loop recorder-reviewed Dr. Tanna Furry last note. SVT noted. Nocturnal bradycardia. Syncope previously likely from autonomic dysfunction. There was an episode of atrial fibrillation in March of 2015, brief, 4 minutes. I reviewed Dr. Ron Parker this consultation on 09/03/13. Her risk of bleeding is quite high given her thin body  habitus, risk of falling. If atrial fibrillation returns, more lengthy episodes, we will revisit anticoagulation. For now risk of anticoagulation outweighs benefit. I discussed with family. They understand. They understand risk of stroke. 3. Syncope - no further episodes. Likely autonomic. 4.  hypertriglyceridemia -stopping Lovaza. 5. Malnutrition-continue to encourage protein. Shakes.  I will stop Lovaza , perhaps this will help with her appetite. 6. 6 month follow up.  Signed, Candee Furbish, MD [facility]  11/17/2014 11:47 AM

## 2014-12-10 ENCOUNTER — Ambulatory Visit
Admission: RE | Admit: 2014-12-10 | Discharge: 2014-12-10 | Disposition: A | Discharge status: Home / Self Care | Payer: Medicare Other | Source: Ambulatory Visit | Attending: Family Medicine | Admitting: Family Medicine

## 2014-12-10 DIAGNOSIS — Z1231 Encounter for screening mammogram for malignant neoplasm of breast: Secondary | ICD-10-CM | POA: Diagnosis not present

## 2014-12-23 DIAGNOSIS — Z23 Encounter for immunization: Secondary | ICD-10-CM | POA: Diagnosis not present

## 2015-02-15 DIAGNOSIS — H4720 Unspecified optic atrophy: Secondary | ICD-10-CM | POA: Diagnosis not present

## 2015-02-15 DIAGNOSIS — D329 Benign neoplasm of meninges, unspecified: Secondary | ICD-10-CM | POA: Diagnosis not present

## 2015-05-05 DIAGNOSIS — E538 Deficiency of other specified B group vitamins: Secondary | ICD-10-CM | POA: Diagnosis not present

## 2015-05-05 DIAGNOSIS — Z79899 Other long term (current) drug therapy: Secondary | ICD-10-CM | POA: Diagnosis not present

## 2015-05-05 DIAGNOSIS — I4891 Unspecified atrial fibrillation: Secondary | ICD-10-CM | POA: Diagnosis not present

## 2015-05-05 DIAGNOSIS — Z681 Body mass index (BMI) 19 or less, adult: Secondary | ICD-10-CM | POA: Diagnosis not present

## 2015-05-05 DIAGNOSIS — G4700 Insomnia, unspecified: Secondary | ICD-10-CM | POA: Diagnosis not present

## 2015-05-05 DIAGNOSIS — E782 Mixed hyperlipidemia: Secondary | ICD-10-CM | POA: Diagnosis not present

## 2015-06-14 DIAGNOSIS — H04123 Dry eye syndrome of bilateral lacrimal glands: Secondary | ICD-10-CM | POA: Diagnosis not present

## 2015-06-14 DIAGNOSIS — D329 Benign neoplasm of meninges, unspecified: Secondary | ICD-10-CM | POA: Diagnosis not present

## 2015-06-14 DIAGNOSIS — H4720 Unspecified optic atrophy: Secondary | ICD-10-CM | POA: Diagnosis not present

## 2015-06-14 DIAGNOSIS — H409 Unspecified glaucoma: Secondary | ICD-10-CM | POA: Diagnosis not present

## 2015-08-09 DIAGNOSIS — D329 Benign neoplasm of meninges, unspecified: Secondary | ICD-10-CM | POA: Diagnosis not present

## 2015-08-09 DIAGNOSIS — H4720 Unspecified optic atrophy: Secondary | ICD-10-CM | POA: Diagnosis not present

## 2015-08-09 DIAGNOSIS — H401133 Primary open-angle glaucoma, bilateral, severe stage: Secondary | ICD-10-CM | POA: Diagnosis not present

## 2015-10-18 DIAGNOSIS — M810 Age-related osteoporosis without current pathological fracture: Secondary | ICD-10-CM | POA: Diagnosis not present

## 2015-10-18 DIAGNOSIS — M25842 Other specified joint disorders, left hand: Secondary | ICD-10-CM | POA: Diagnosis not present

## 2015-10-18 DIAGNOSIS — Z79899 Other long term (current) drug therapy: Secondary | ICD-10-CM | POA: Diagnosis not present

## 2015-10-18 DIAGNOSIS — M160 Bilateral primary osteoarthritis of hip: Secondary | ICD-10-CM | POA: Diagnosis not present

## 2015-10-18 DIAGNOSIS — M25841 Other specified joint disorders, right hand: Secondary | ICD-10-CM | POA: Diagnosis not present

## 2015-10-18 DIAGNOSIS — M0589 Other rheumatoid arthritis with rheumatoid factor of multiple sites: Secondary | ICD-10-CM | POA: Diagnosis not present

## 2015-11-01 DIAGNOSIS — Z79899 Other long term (current) drug therapy: Secondary | ICD-10-CM | POA: Diagnosis not present

## 2015-11-01 DIAGNOSIS — M0589 Other rheumatoid arthritis with rheumatoid factor of multiple sites: Secondary | ICD-10-CM | POA: Diagnosis not present

## 2015-11-01 DIAGNOSIS — M810 Age-related osteoporosis without current pathological fracture: Secondary | ICD-10-CM | POA: Diagnosis not present

## 2015-11-01 DIAGNOSIS — E559 Vitamin D deficiency, unspecified: Secondary | ICD-10-CM | POA: Diagnosis not present

## 2015-11-02 DIAGNOSIS — E782 Mixed hyperlipidemia: Secondary | ICD-10-CM | POA: Diagnosis not present

## 2015-11-02 DIAGNOSIS — Z6820 Body mass index (BMI) 20.0-20.9, adult: Secondary | ICD-10-CM | POA: Diagnosis not present

## 2015-11-02 DIAGNOSIS — G4700 Insomnia, unspecified: Secondary | ICD-10-CM | POA: Diagnosis not present

## 2015-11-02 DIAGNOSIS — I4891 Unspecified atrial fibrillation: Secondary | ICD-10-CM | POA: Diagnosis not present

## 2015-11-15 DIAGNOSIS — D329 Benign neoplasm of meninges, unspecified: Secondary | ICD-10-CM | POA: Diagnosis not present

## 2015-11-15 DIAGNOSIS — H401133 Primary open-angle glaucoma, bilateral, severe stage: Secondary | ICD-10-CM | POA: Diagnosis not present

## 2015-11-15 DIAGNOSIS — H4720 Unspecified optic atrophy: Secondary | ICD-10-CM | POA: Diagnosis not present

## 2015-12-08 DIAGNOSIS — Z9049 Acquired absence of other specified parts of digestive tract: Secondary | ICD-10-CM | POA: Diagnosis not present

## 2015-12-08 DIAGNOSIS — H401113 Primary open-angle glaucoma, right eye, severe stage: Secondary | ICD-10-CM | POA: Diagnosis not present

## 2015-12-08 DIAGNOSIS — H409 Unspecified glaucoma: Secondary | ICD-10-CM | POA: Diagnosis not present

## 2015-12-08 DIAGNOSIS — D329 Benign neoplasm of meninges, unspecified: Secondary | ICD-10-CM | POA: Diagnosis not present

## 2015-12-08 DIAGNOSIS — Z9849 Cataract extraction status, unspecified eye: Secondary | ICD-10-CM | POA: Diagnosis not present

## 2015-12-08 DIAGNOSIS — Z902 Acquired absence of lung [part of]: Secondary | ICD-10-CM | POA: Diagnosis not present

## 2015-12-08 DIAGNOSIS — H4720 Unspecified optic atrophy: Secondary | ICD-10-CM | POA: Diagnosis not present

## 2015-12-16 DIAGNOSIS — Z23 Encounter for immunization: Secondary | ICD-10-CM | POA: Diagnosis not present

## 2015-12-16 DIAGNOSIS — M810 Age-related osteoporosis without current pathological fracture: Secondary | ICD-10-CM | POA: Diagnosis not present

## 2015-12-16 DIAGNOSIS — E559 Vitamin D deficiency, unspecified: Secondary | ICD-10-CM | POA: Diagnosis not present

## 2015-12-16 DIAGNOSIS — M0589 Other rheumatoid arthritis with rheumatoid factor of multiple sites: Secondary | ICD-10-CM | POA: Diagnosis not present

## 2015-12-16 DIAGNOSIS — Z79899 Other long term (current) drug therapy: Secondary | ICD-10-CM | POA: Diagnosis not present

## 2015-12-29 DIAGNOSIS — Z79899 Other long term (current) drug therapy: Secondary | ICD-10-CM | POA: Diagnosis not present

## 2015-12-29 DIAGNOSIS — E559 Vitamin D deficiency, unspecified: Secondary | ICD-10-CM | POA: Diagnosis not present

## 2015-12-29 DIAGNOSIS — M810 Age-related osteoporosis without current pathological fracture: Secondary | ICD-10-CM | POA: Diagnosis not present

## 2015-12-29 DIAGNOSIS — M0589 Other rheumatoid arthritis with rheumatoid factor of multiple sites: Secondary | ICD-10-CM | POA: Diagnosis not present

## 2016-02-28 DIAGNOSIS — M810 Age-related osteoporosis without current pathological fracture: Secondary | ICD-10-CM | POA: Diagnosis not present

## 2016-02-28 DIAGNOSIS — M0589 Other rheumatoid arthritis with rheumatoid factor of multiple sites: Secondary | ICD-10-CM | POA: Diagnosis not present

## 2016-02-28 DIAGNOSIS — Z79899 Other long term (current) drug therapy: Secondary | ICD-10-CM | POA: Diagnosis not present

## 2016-03-06 DIAGNOSIS — Z23 Encounter for immunization: Secondary | ICD-10-CM | POA: Diagnosis not present

## 2016-03-25 IMAGING — CT CT ABD-PELV W/ CM
2 of 5 series · 16 of 46 positions shown, 18 images · IV contrast (READICAT/WATER & 75CC OMNI 300)
Comparison: Abdominal pelvic CT 05/13/2012.

CLINICAL DATA: Abdominal pain with nausea and weight loss. History
of renal cell carcinoma.

EXAM:
CT ABDOMEN AND PELVIS WITH CONTRAST
TECHNIQUE: Multidetector CT imaging of the abdomen and pelvis was performed
using the standard protocol following bolus administration of
intravenous contrast.
CONTRAST:  75 ml Omnipaque 300.

[Series 2: abd/pelvis with · axial · 0.62mm/px · z∈[-413,+17]mm · 13 of 95 slices shown, 15 images]
[im 5/95  soft-tissue]
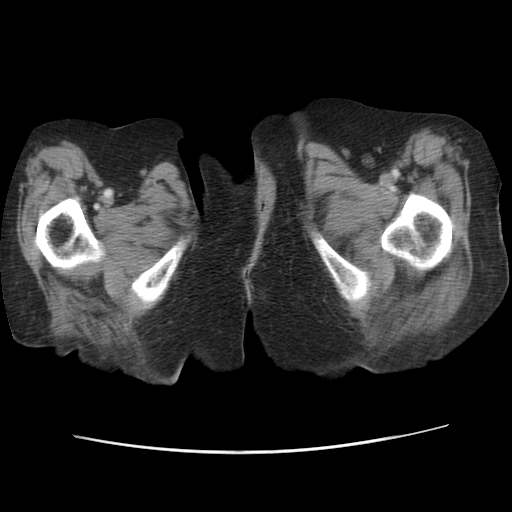
[im 5/95  bone]
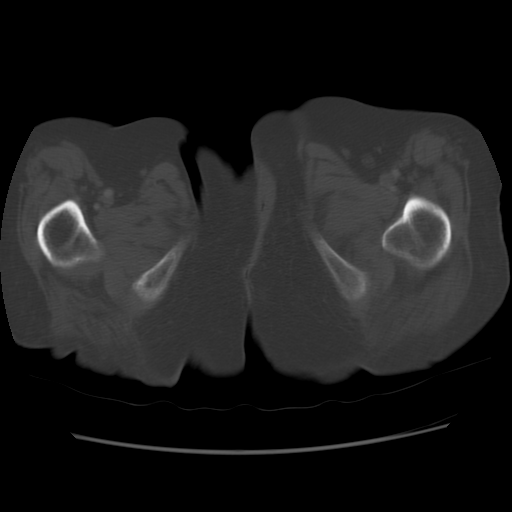
[im 15/95  soft-tissue]
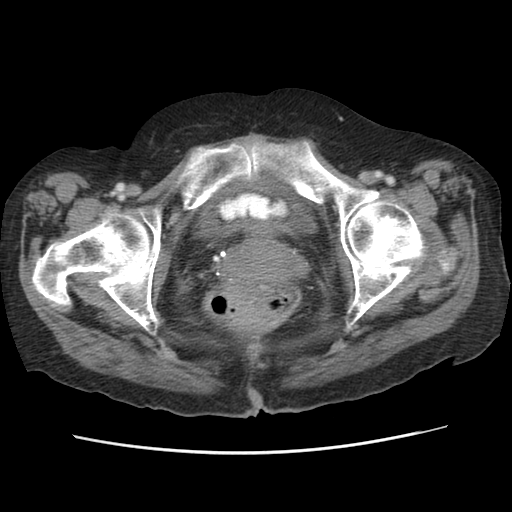
[im 20/95  soft-tissue]
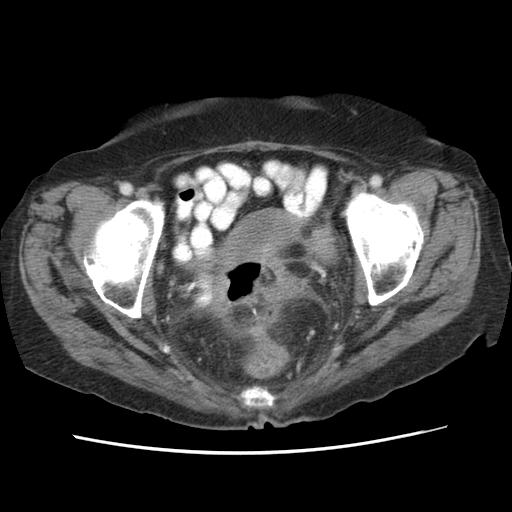
[im 25/95  soft-tissue]
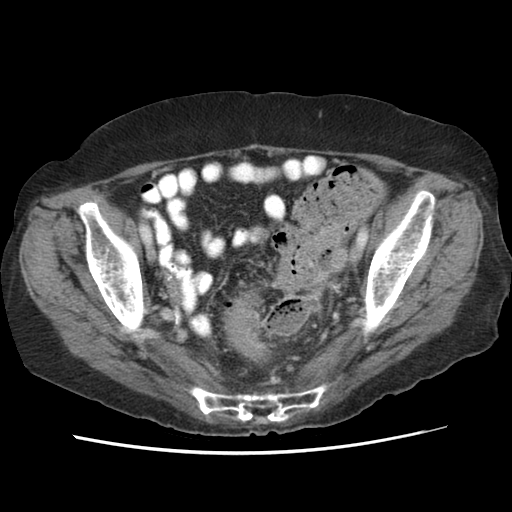
[im 35/95  soft-tissue]
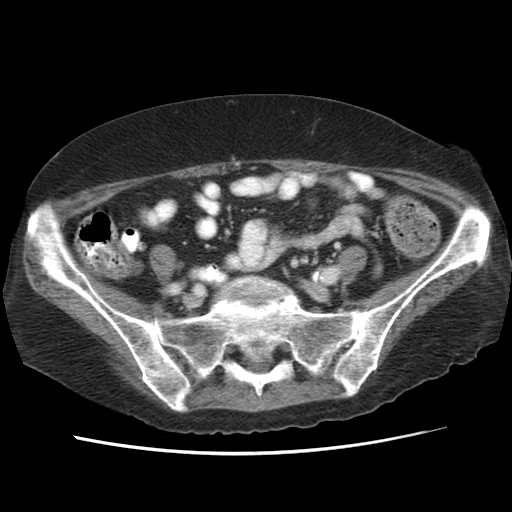
[im 40/95  soft-tissue]
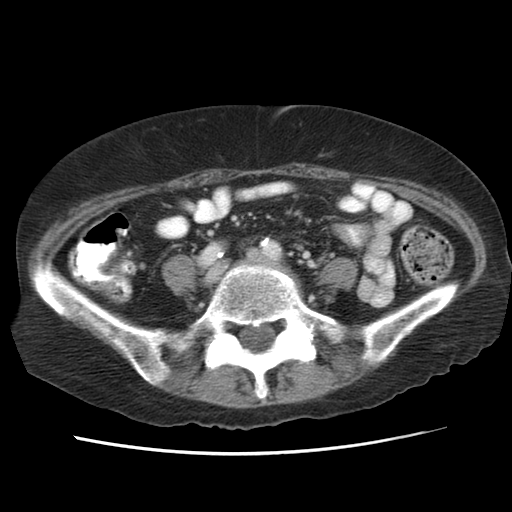
[im 50/95  soft-tissue]
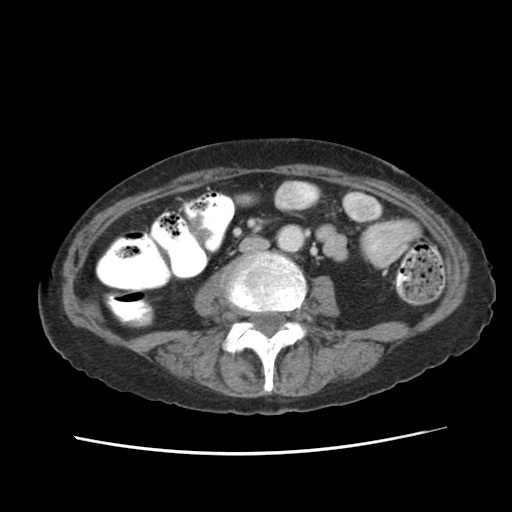
[im 55/95  soft-tissue]
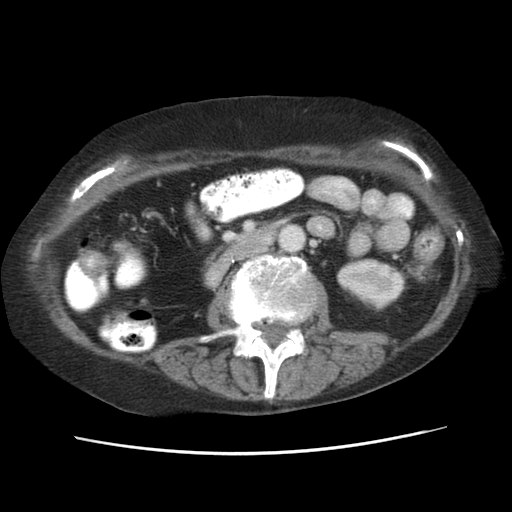
[im 60/95  soft-tissue]
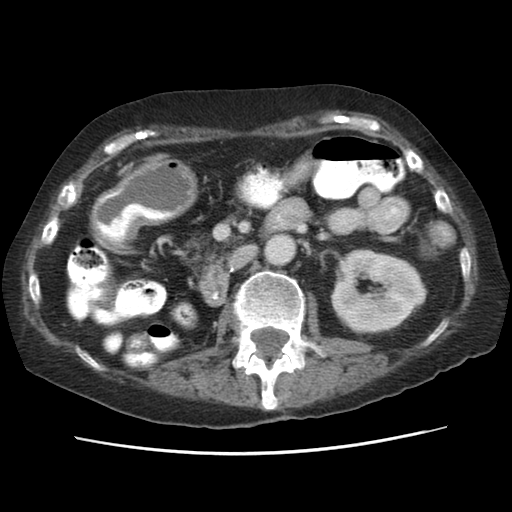
[im 60/95  bone]
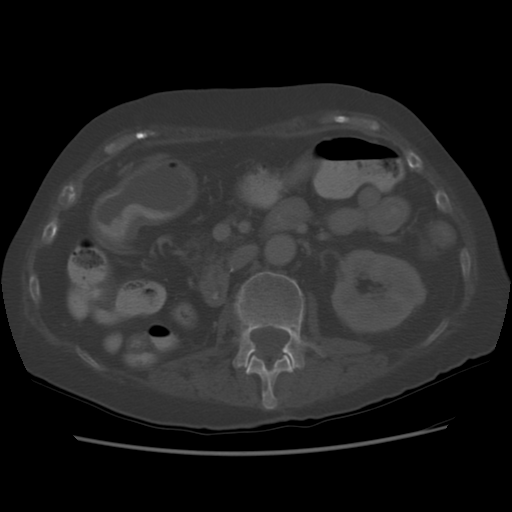
[im 70/95  soft-tissue]
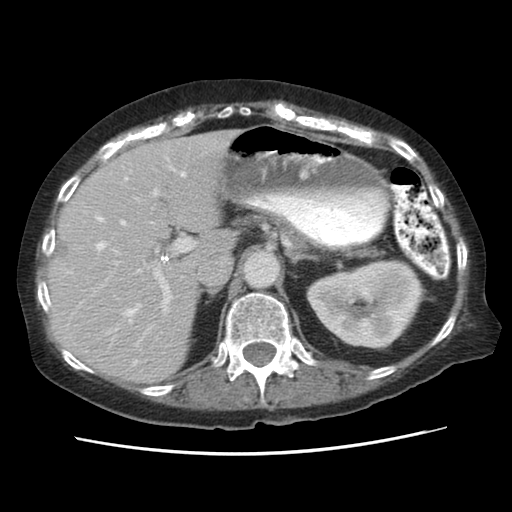
[im 75/95  soft-tissue]
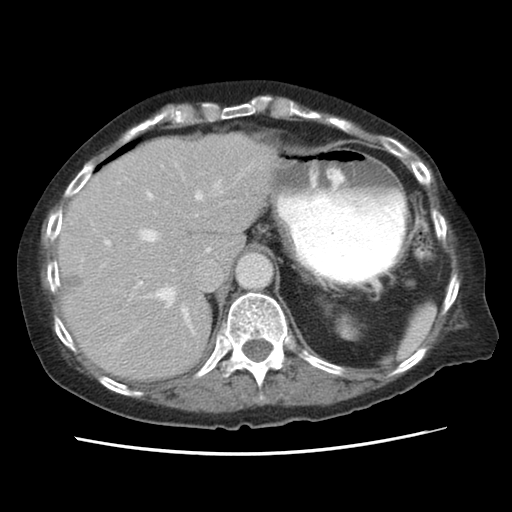
[im 80/95  soft-tissue]
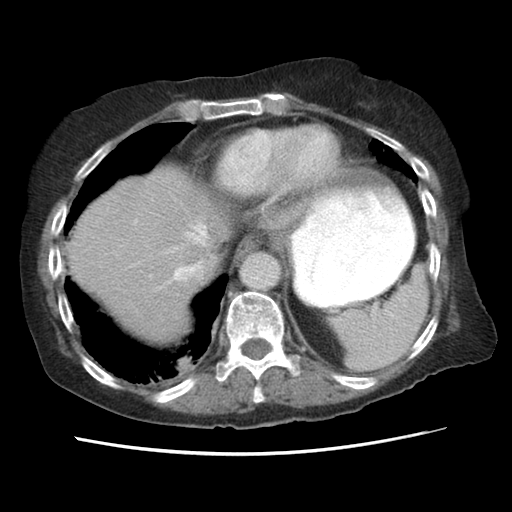
[im 90/95  soft-tissue]
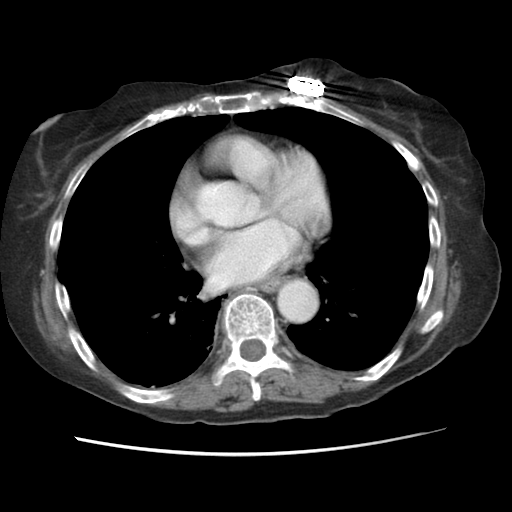

[Series 400: cor · coronal · 0.95mm/px · 3 of 124 slices shown]
[im 42/124  soft-tissue]
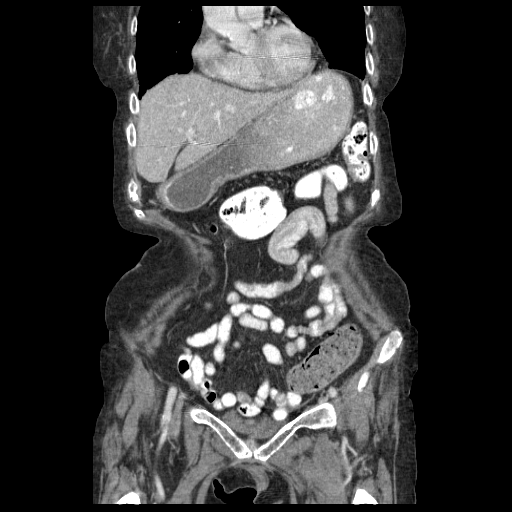
[im 55/124  soft-tissue]
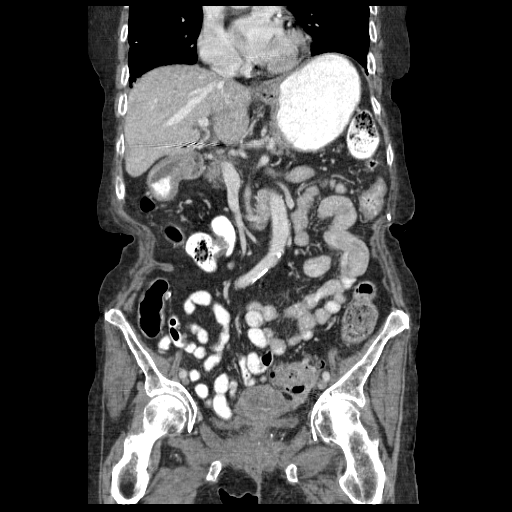
[im 69/124  soft-tissue]
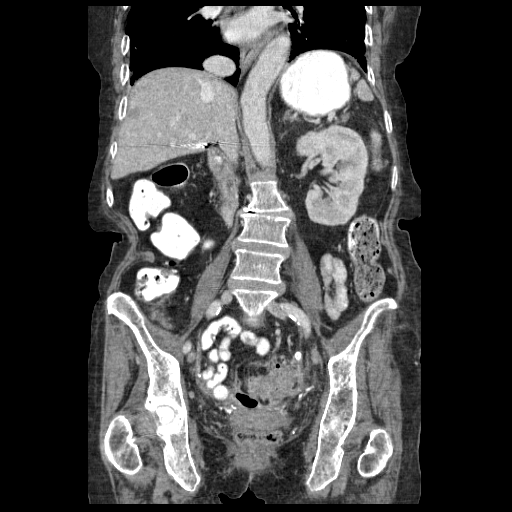

[16 of 46 positions shown; findings below may reference images not displayed]

FINDINGS: There is stable scarring at the right lung base, including a nodular
component in the right lower lobe measuring 1.4 cm on image 17.
There is no significant pleural or pericardial effusion.

The gallbladder is surgically absent. There is no significant
biliary dilatation. The liver and spleen appear unremarkable. The
pancreas is diffusely atrophied without focal abnormality.

The right kidney is surgically absent. There is no mass within the
nephrectomy bed. The left kidney appears normal. Both adrenal glands
appear normal.

The stomach, small bowel and proximal colon demonstrate no
significant findings. There is extensive sigmoid colon
diverticulitis with wall thickening and surrounding inflammation.
There are multiple extraluminal air fluid collections surrounding
the sigmoid colon. These measure 3.5 x 2.7 cm along the left aspect
of the proximal sigmoid colon (image 74) and 4.5 x 1.9 cm more
distally along the left aspect of the sigmoid colon. Within the
pelvic cul-de-sac, there is a 3.1 x 4.8 cm air-fluid collection on
image 77. The contrast material has not yet passed through the
sigmoid colon. The uterus and ovaries appear normal. The bladder is
nearly empty, but demonstrates no pneumatosis. A degree of pelvic
floor relaxation is suspected. There is no evidence of bowel
obstruction or pneumoperitoneum.

Chronic L1 compression deformity is unchanged. There are no
worrisome osseous findings. Subchondral sclerosis anteriorly in the
right femoral head is stable, potentially reflecting chronic
avascular necrosis.
IMPRESSION: 1. Acute sigmoid diverticulitis with multiple pericolonic abscesses.
2. No evidence of free intraperitoneal air or bowel obstruction.
3. No evidence of metastatic renal cell carcinoma. The right
nephrectomy bed and left kidney have stable appearances.
4. These results will be called to the ordering clinician or
representative by the [HOSPITAL] at the imaging location.
Patient has been detained on site for subsequent disposition.

## 2016-03-27 DIAGNOSIS — H401133 Primary open-angle glaucoma, bilateral, severe stage: Secondary | ICD-10-CM | POA: Diagnosis not present

## 2016-03-27 DIAGNOSIS — D329 Benign neoplasm of meninges, unspecified: Secondary | ICD-10-CM | POA: Diagnosis not present

## 2016-03-27 DIAGNOSIS — H4720 Unspecified optic atrophy: Secondary | ICD-10-CM | POA: Diagnosis not present

## 2016-03-27 IMAGING — CT CT IMAGE GUIDED DRAINAGE BY PERCUTANEOUS CATHETER
1 of 3 series · 13 of 32 positions shown, 18 images · non-contrast
Comparison: CT the abdomen pelvis - 08/13/2013

INDICATION: Diverticular abscess. Please attempted CT-guided drainage of the
dominant collection located within the lower pelvis.

EXAM:
CT IMAGE GUIDED DRAINAGE BY PERCUTANEOUS CATHETER

[Series 2: i-spiral 5.0 b40f · axial · 0.73mm/px · z∈[+795,+952]mm · 13 of 53 slices shown, 18 images]
[im 4/53  soft-tissue]
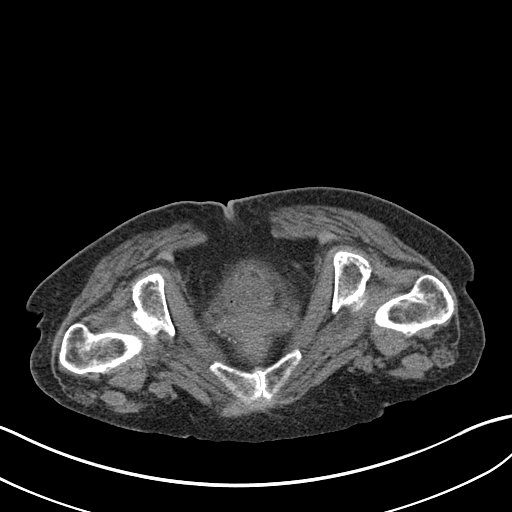
[im 4/53  bone]
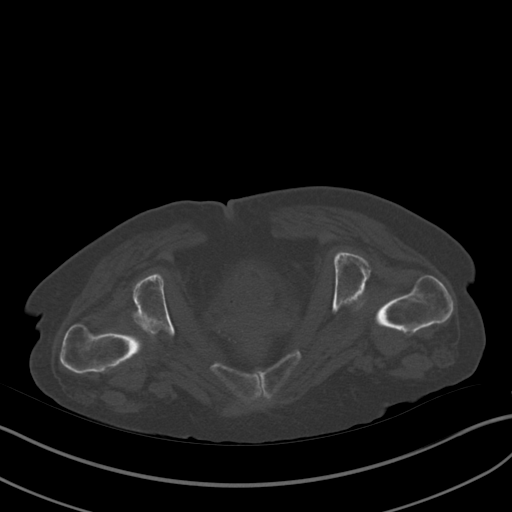
[im 7/53  soft-tissue]
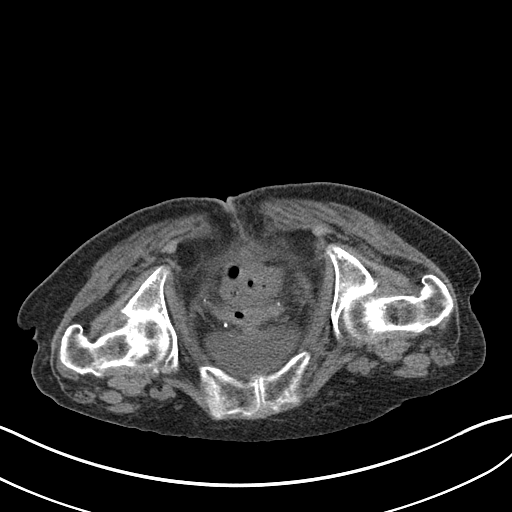
[im 14/53  soft-tissue]
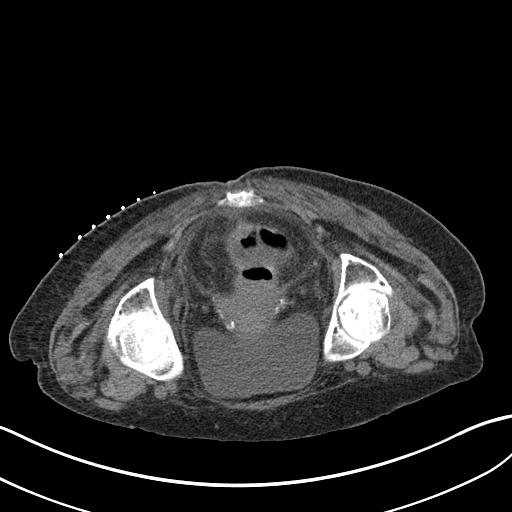
[im 17/53  soft-tissue]
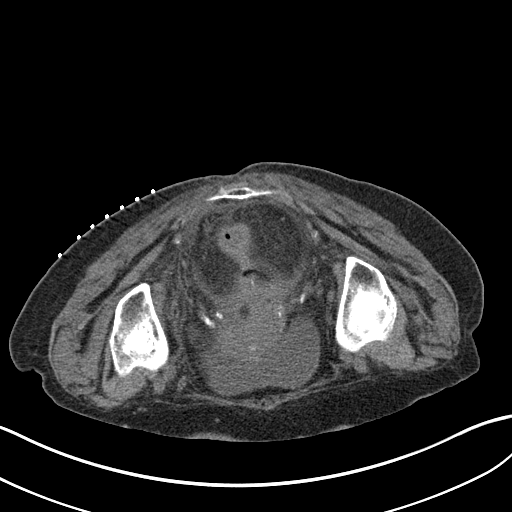
[im 20/53  soft-tissue]
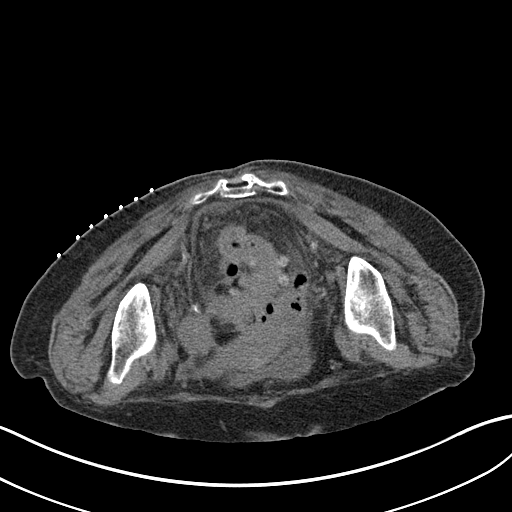
[im 23/53  soft-tissue]
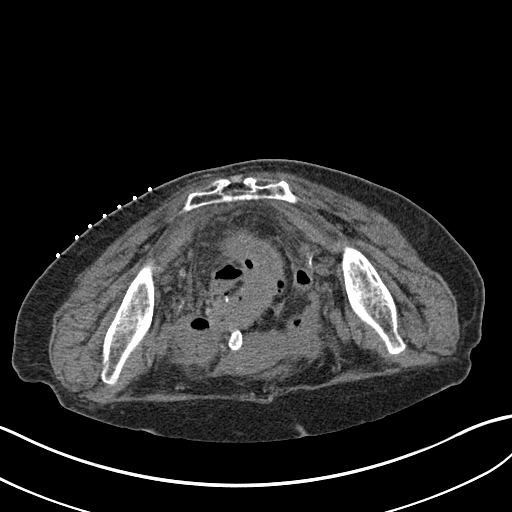
[im 30/53  soft-tissue]
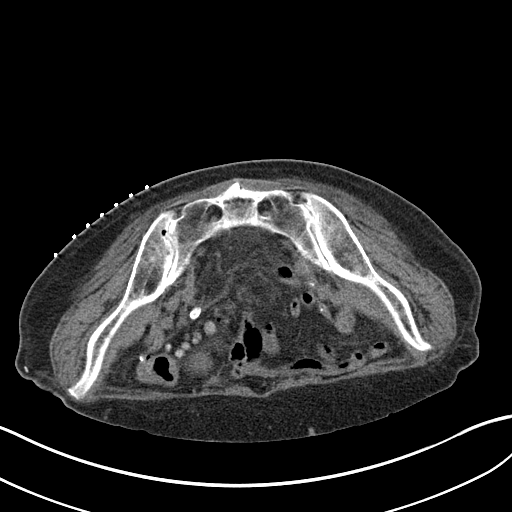
[im 33/53  soft-tissue]
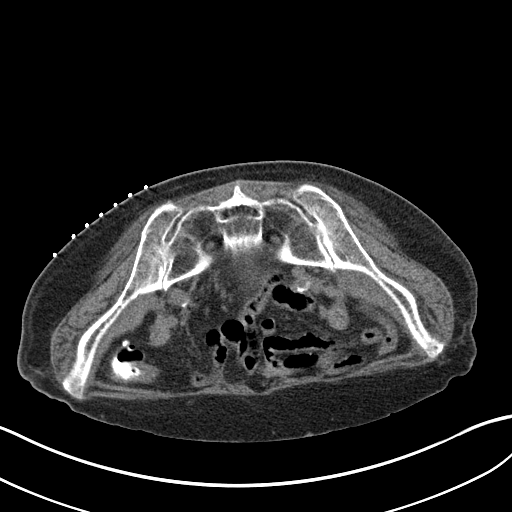
[im 36/53  soft-tissue]
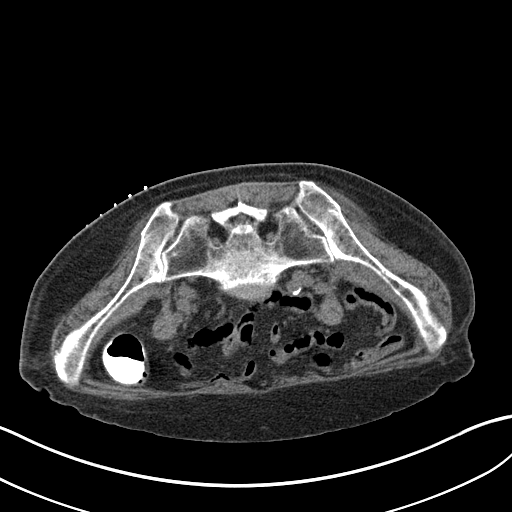
[im 36/53  bone]
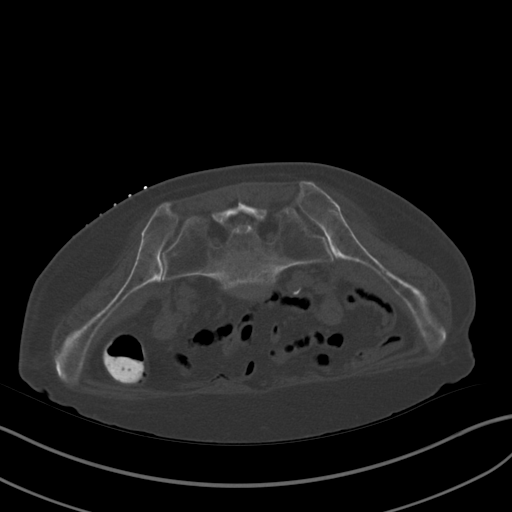
[im 40/53  soft-tissue]
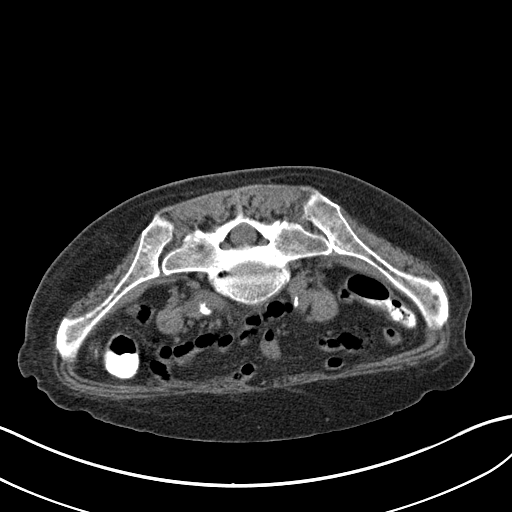
[im 40/53  lung]
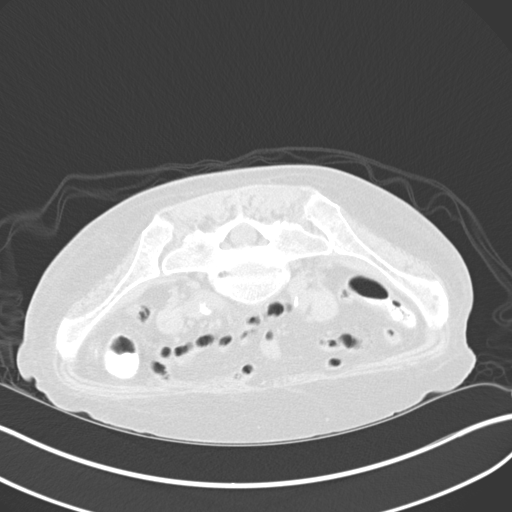
[im 43/53  lung]
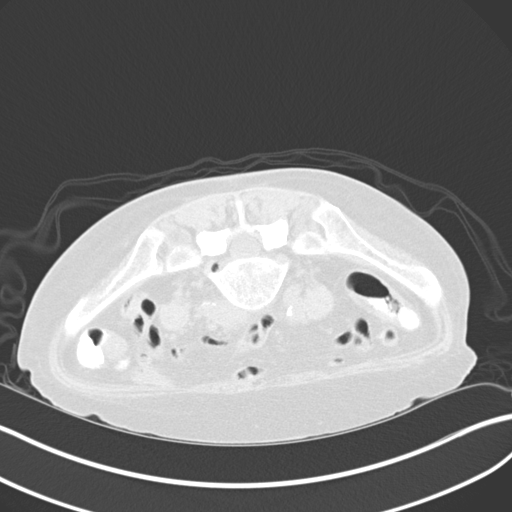
[im 46/53  soft-tissue]
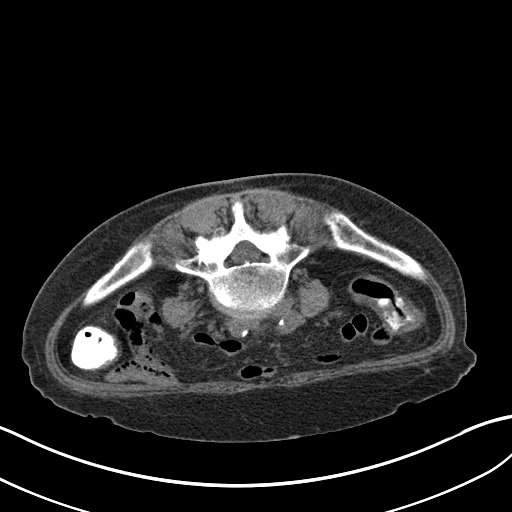
[im 46/53  lung]
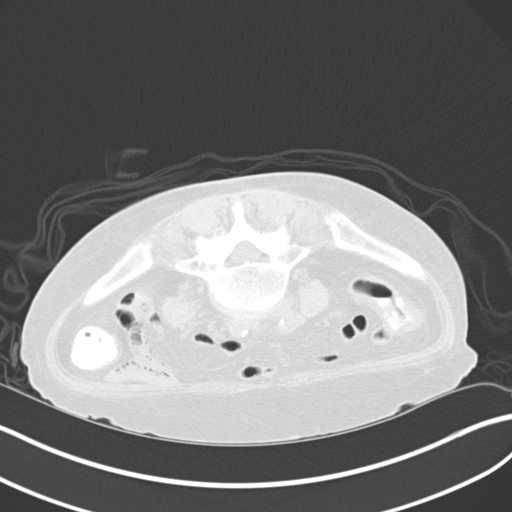
[im 49/53  soft-tissue]
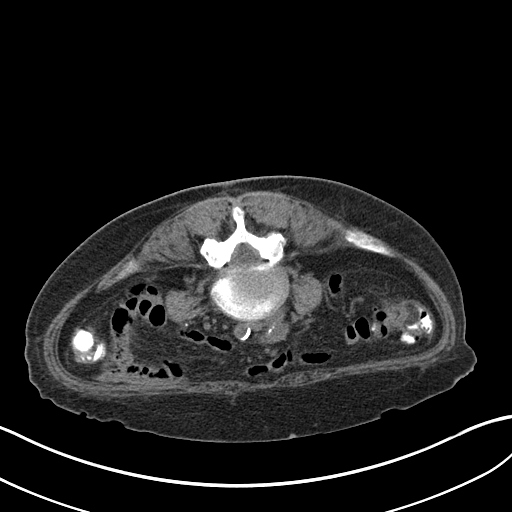
[im 49/53  lung]
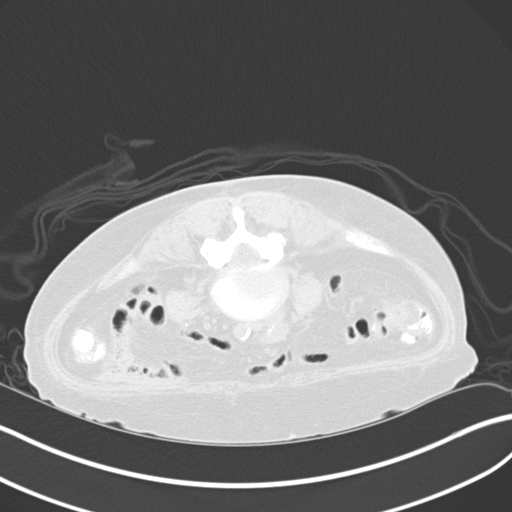

[13 of 32 positions shown; findings below may reference images not displayed]

MEDICATIONS:
The patient is currently admitted to the hospital and receiving
intravenous antibiotics. The antibiotics were administered within an
appropriate time frame prior to the initiation of the procedure.

ANESTHESIA/SEDATION:
Fentanyl 75 mcg IV; Versed 2 mg IV

Total Moderate Sedation time

25 minutes

CONTRAST:  None

COMPLICATIONS:
None immediate

PROCEDURE:
Informed written consent was obtained from the patient after a
discussion of the risks, benefits and alternatives to treatment. The
patient was placed prone on the CT gantry and a pre procedural CT
was performed re-demonstrating the known abdominal abscess/fluid
collection within the lower pelvis, which appeared minimally
decreased in size in the interval, currently measuring approximately
2.9 x 1.9 cm (image 40, series 2), previously, 4.8 x 3.1 cm.

Of note, the additional smaller diverticular abscesses within the
left hemipelvis also appear to have decreased in size in interval
measuring approximately 1.8 x 3.2 cm, previously, 1.9 x 4.5 cm, and
approximately 1.2 x 2.3 cm, previously 3.5 x 2.7 cm (both smaller
abscesses seen on image 30, series 2).

The decision was made to perform CT guided drainage of the residual
dominant collection within the lower pelvis. The procedure was
planned. A timeout was performed prior to the initiation of the
procedure.

The left buttocks was prepped and draped in the usual sterile
fashion. The overlying soft tissues were anesthetized with 1%
lidocaine with epinephrine. Appropriate trajectory was planned with
the use of a 22 gauge spinal needle. An 18 gauge trocar needle was
advanced into the abscess/fluid collection and a short Amplatz super
stiff wire was coiled within the collection. Appropriate positioning
was confirmed with a limited CT scan. The tract was serially dilated
allowing placement of a 10 French all-purpose drainage catheter.
Appropriate positioning was confirmed with a limited postprocedural
CT scan.

Approximately 5 cc of purulent fluid was aspirated. The tube was
connected to a drainage bag and sutured in place. A dressing was
placed. The patient tolerated the procedure well without immediate
post procedural complication.
IMPRESSION: 1. Successful CT guided placement of a 10 French all purpose drain
catheter into the dominant diverticular abscess within the lower
pelvis via a left trans gluteal approach with aspiration of 5 mL of
purulent fluid. Samples were sent to the laboratory as requested by
the ordering clinical team.
2. The two additional smaller diverticular abscesses with the left
hemipelvis have decreased in size and are too small to allow for
percutaneous drainage.

PLAN:
- Pending drain output, would recommend followup pelvic CT with oral
and IV contrast in approximately 5-7 days.

- Recommend fluoroscopic drainage catheter injection prior to
removal.

## 2016-05-03 DIAGNOSIS — E782 Mixed hyperlipidemia: Secondary | ICD-10-CM | POA: Diagnosis not present

## 2016-05-03 DIAGNOSIS — I4891 Unspecified atrial fibrillation: Secondary | ICD-10-CM | POA: Diagnosis not present

## 2016-05-03 DIAGNOSIS — Z79899 Other long term (current) drug therapy: Secondary | ICD-10-CM | POA: Diagnosis not present

## 2016-05-03 DIAGNOSIS — Z6822 Body mass index (BMI) 22.0-22.9, adult: Secondary | ICD-10-CM | POA: Diagnosis not present

## 2016-05-03 DIAGNOSIS — G4700 Insomnia, unspecified: Secondary | ICD-10-CM | POA: Diagnosis not present

## 2016-05-03 DIAGNOSIS — M069 Rheumatoid arthritis, unspecified: Secondary | ICD-10-CM | POA: Diagnosis not present

## 2016-06-26 DIAGNOSIS — H4720 Unspecified optic atrophy: Secondary | ICD-10-CM | POA: Diagnosis not present

## 2016-06-26 DIAGNOSIS — H1013 Acute atopic conjunctivitis, bilateral: Secondary | ICD-10-CM | POA: Diagnosis not present

## 2016-06-26 DIAGNOSIS — H401193 Primary open-angle glaucoma, unspecified eye, severe stage: Secondary | ICD-10-CM | POA: Diagnosis not present

## 2016-06-26 DIAGNOSIS — D329 Benign neoplasm of meninges, unspecified: Secondary | ICD-10-CM | POA: Diagnosis not present

## 2016-08-27 DIAGNOSIS — Z1389 Encounter for screening for other disorder: Secondary | ICD-10-CM | POA: Diagnosis not present

## 2016-08-27 DIAGNOSIS — E785 Hyperlipidemia, unspecified: Secondary | ICD-10-CM | POA: Diagnosis not present

## 2016-08-27 DIAGNOSIS — Z0000 Encounter for general adult medical examination without abnormal findings: Secondary | ICD-10-CM | POA: Diagnosis not present

## 2016-08-27 DIAGNOSIS — Z9181 History of falling: Secondary | ICD-10-CM | POA: Diagnosis not present

## 2016-08-27 DIAGNOSIS — Z136 Encounter for screening for cardiovascular disorders: Secondary | ICD-10-CM | POA: Diagnosis not present

## 2016-10-08 DIAGNOSIS — M0589 Other rheumatoid arthritis with rheumatoid factor of multiple sites: Secondary | ICD-10-CM | POA: Diagnosis not present

## 2016-10-08 DIAGNOSIS — M810 Age-related osteoporosis without current pathological fracture: Secondary | ICD-10-CM | POA: Diagnosis not present

## 2016-10-08 DIAGNOSIS — Z79899 Other long term (current) drug therapy: Secondary | ICD-10-CM | POA: Diagnosis not present

## 2016-10-31 DIAGNOSIS — M069 Rheumatoid arthritis, unspecified: Secondary | ICD-10-CM | POA: Diagnosis not present

## 2016-10-31 DIAGNOSIS — B354 Tinea corporis: Secondary | ICD-10-CM | POA: Diagnosis not present

## 2016-10-31 DIAGNOSIS — Z681 Body mass index (BMI) 19 or less, adult: Secondary | ICD-10-CM | POA: Diagnosis not present

## 2016-10-31 DIAGNOSIS — G4700 Insomnia, unspecified: Secondary | ICD-10-CM | POA: Diagnosis not present

## 2016-10-31 DIAGNOSIS — Z79899 Other long term (current) drug therapy: Secondary | ICD-10-CM | POA: Diagnosis not present

## 2016-11-28 DIAGNOSIS — Z23 Encounter for immunization: Secondary | ICD-10-CM | POA: Diagnosis not present

## 2017-01-07 DIAGNOSIS — Z79899 Other long term (current) drug therapy: Secondary | ICD-10-CM | POA: Diagnosis not present

## 2017-01-07 DIAGNOSIS — M0589 Other rheumatoid arthritis with rheumatoid factor of multiple sites: Secondary | ICD-10-CM | POA: Diagnosis not present

## 2017-01-07 DIAGNOSIS — M810 Age-related osteoporosis without current pathological fracture: Secondary | ICD-10-CM | POA: Diagnosis not present

## 2017-02-03 ENCOUNTER — Emergency Department (HOSPITAL_COMMUNITY): Payer: No Typology Code available for payment source

## 2017-02-03 ENCOUNTER — Encounter (HOSPITAL_COMMUNITY): Payer: Self-pay | Admitting: Emergency Medicine

## 2017-02-03 ENCOUNTER — Inpatient Hospital Stay (HOSPITAL_COMMUNITY)
Admission: EM | Admit: 2017-02-03 | Discharge: 2017-02-06 | DRG: 871 | Disposition: A | Discharge status: Home Health | Payer: No Typology Code available for payment source | Attending: Internal Medicine | Admitting: Internal Medicine

## 2017-02-03 ENCOUNTER — Other Ambulatory Visit: Payer: Self-pay

## 2017-02-03 DIAGNOSIS — Z902 Acquired absence of lung [part of]: Secondary | ICD-10-CM | POA: Diagnosis not present

## 2017-02-03 DIAGNOSIS — S22089A Unspecified fracture of T11-T12 vertebra, initial encounter for closed fracture: Secondary | ICD-10-CM | POA: Diagnosis present

## 2017-02-03 DIAGNOSIS — S2241XA Multiple fractures of ribs, right side, initial encounter for closed fracture: Secondary | ICD-10-CM | POA: Diagnosis present

## 2017-02-03 DIAGNOSIS — Z7983 Long term (current) use of bisphosphonates: Secondary | ICD-10-CM

## 2017-02-03 DIAGNOSIS — A419 Sepsis, unspecified organism: Secondary | ICD-10-CM | POA: Diagnosis present

## 2017-02-03 DIAGNOSIS — S3993XA Unspecified injury of pelvis, initial encounter: Secondary | ICD-10-CM | POA: Diagnosis not present

## 2017-02-03 DIAGNOSIS — S299XXA Unspecified injury of thorax, initial encounter: Secondary | ICD-10-CM | POA: Diagnosis not present

## 2017-02-03 DIAGNOSIS — S36115A Moderate laceration of liver, initial encounter: Secondary | ICD-10-CM | POA: Diagnosis present

## 2017-02-03 DIAGNOSIS — I480 Paroxysmal atrial fibrillation: Secondary | ICD-10-CM | POA: Diagnosis not present

## 2017-02-03 DIAGNOSIS — R000 Tachycardia, unspecified: Secondary | ICD-10-CM

## 2017-02-03 DIAGNOSIS — J9601 Acute respiratory failure with hypoxia: Secondary | ICD-10-CM | POA: Diagnosis present

## 2017-02-03 DIAGNOSIS — E785 Hyperlipidemia, unspecified: Secondary | ICD-10-CM | POA: Diagnosis present

## 2017-02-03 DIAGNOSIS — J181 Lobar pneumonia, unspecified organism: Secondary | ICD-10-CM | POA: Diagnosis present

## 2017-02-03 DIAGNOSIS — I129 Hypertensive chronic kidney disease with stage 1 through stage 4 chronic kidney disease, or unspecified chronic kidney disease: Secondary | ICD-10-CM | POA: Diagnosis present

## 2017-02-03 DIAGNOSIS — Y92410 Unspecified street and highway as the place of occurrence of the external cause: Secondary | ICD-10-CM

## 2017-02-03 DIAGNOSIS — Z681 Body mass index (BMI) 19 or less, adult: Secondary | ICD-10-CM | POA: Diagnosis not present

## 2017-02-03 DIAGNOSIS — D649 Anemia, unspecified: Secondary | ICD-10-CM | POA: Diagnosis present

## 2017-02-03 DIAGNOSIS — S36113A Laceration of liver, unspecified degree, initial encounter: Secondary | ICD-10-CM | POA: Diagnosis not present

## 2017-02-03 DIAGNOSIS — Z79818 Long term (current) use of other agents affecting estrogen receptors and estrogen levels: Secondary | ICD-10-CM

## 2017-02-03 DIAGNOSIS — J189 Pneumonia, unspecified organism: Secondary | ICD-10-CM

## 2017-02-03 DIAGNOSIS — Z905 Acquired absence of kidney: Secondary | ICD-10-CM

## 2017-02-03 DIAGNOSIS — I10 Essential (primary) hypertension: Secondary | ICD-10-CM | POA: Diagnosis present

## 2017-02-03 DIAGNOSIS — G8929 Other chronic pain: Secondary | ICD-10-CM | POA: Diagnosis present

## 2017-02-03 DIAGNOSIS — M069 Rheumatoid arthritis, unspecified: Secondary | ICD-10-CM | POA: Diagnosis present

## 2017-02-03 DIAGNOSIS — F0390 Unspecified dementia without behavioral disturbance: Secondary | ICD-10-CM | POA: Diagnosis present

## 2017-02-03 DIAGNOSIS — E876 Hypokalemia: Secondary | ICD-10-CM | POA: Diagnosis present

## 2017-02-03 DIAGNOSIS — D509 Iron deficiency anemia, unspecified: Secondary | ICD-10-CM | POA: Diagnosis present

## 2017-02-03 DIAGNOSIS — Z85528 Personal history of other malignant neoplasm of kidney: Secondary | ICD-10-CM | POA: Diagnosis not present

## 2017-02-03 DIAGNOSIS — S0990XA Unspecified injury of head, initial encounter: Secondary | ICD-10-CM | POA: Diagnosis not present

## 2017-02-03 DIAGNOSIS — R109 Unspecified abdominal pain: Secondary | ICD-10-CM | POA: Diagnosis not present

## 2017-02-03 DIAGNOSIS — S22088A Other fracture of T11-T12 vertebra, initial encounter for closed fracture: Secondary | ICD-10-CM | POA: Diagnosis present

## 2017-02-03 DIAGNOSIS — Z79899 Other long term (current) drug therapy: Secondary | ICD-10-CM

## 2017-02-03 DIAGNOSIS — I471 Supraventricular tachycardia: Secondary | ICD-10-CM | POA: Diagnosis present

## 2017-02-03 DIAGNOSIS — E43 Unspecified severe protein-calorie malnutrition: Secondary | ICD-10-CM | POA: Diagnosis not present

## 2017-02-03 DIAGNOSIS — S2249XA Multiple fractures of ribs, unspecified side, initial encounter for closed fracture: Secondary | ICD-10-CM | POA: Diagnosis present

## 2017-02-03 DIAGNOSIS — Z85820 Personal history of malignant melanoma of skin: Secondary | ICD-10-CM

## 2017-02-03 DIAGNOSIS — J90 Pleural effusion, not elsewhere classified: Secondary | ICD-10-CM | POA: Diagnosis not present

## 2017-02-03 DIAGNOSIS — S199XXA Unspecified injury of neck, initial encounter: Secondary | ICD-10-CM | POA: Diagnosis not present

## 2017-02-03 DIAGNOSIS — I4891 Unspecified atrial fibrillation: Secondary | ICD-10-CM | POA: Diagnosis present

## 2017-02-03 DIAGNOSIS — R9431 Abnormal electrocardiogram [ECG] [EKG]: Secondary | ICD-10-CM | POA: Diagnosis not present

## 2017-02-03 DIAGNOSIS — S2239XA Fracture of one rib, unspecified side, initial encounter for closed fracture: Secondary | ICD-10-CM | POA: Diagnosis present

## 2017-02-03 DIAGNOSIS — S3681XA Injury of peritoneum, initial encounter: Secondary | ICD-10-CM | POA: Diagnosis not present

## 2017-02-03 DIAGNOSIS — S3991XA Unspecified injury of abdomen, initial encounter: Secondary | ICD-10-CM | POA: Diagnosis not present

## 2017-02-03 DIAGNOSIS — N183 Chronic kidney disease, stage 3 (moderate): Secondary | ICD-10-CM | POA: Diagnosis present

## 2017-02-03 DIAGNOSIS — K219 Gastro-esophageal reflux disease without esophagitis: Secondary | ICD-10-CM | POA: Diagnosis present

## 2017-02-03 DIAGNOSIS — J45909 Unspecified asthma, uncomplicated: Secondary | ICD-10-CM | POA: Diagnosis present

## 2017-02-03 LAB — CBC WITH DIFFERENTIAL/PLATELET
BASOS PCT: 1 %
Basophils Absolute: 0.2 10*3/uL — ABNORMAL HIGH (ref 0.0–0.1)
EOS PCT: 0 %
Eosinophils Absolute: 0 10*3/uL (ref 0.0–0.7)
HEMATOCRIT: 29.1 % — AB (ref 36.0–46.0)
HEMOGLOBIN: 9.7 g/dL — AB (ref 12.0–15.0)
LYMPHS PCT: 5 %
Lymphs Abs: 1.1 10*3/uL (ref 0.7–4.0)
MCH: 32.8 pg (ref 26.0–34.0)
MCHC: 33.3 g/dL (ref 30.0–36.0)
MCV: 98.3 fL (ref 78.0–100.0)
MONOS PCT: 5 %
Monocytes Absolute: 1.1 10*3/uL — ABNORMAL HIGH (ref 0.1–1.0)
NEUTROS ABS: 20 10*3/uL — AB (ref 1.7–7.7)
Neutrophils Relative %: 89 %
Platelets: 277 10*3/uL (ref 150–400)
RBC: 2.96 MIL/uL — ABNORMAL LOW (ref 3.87–5.11)
RDW: 14.2 % (ref 11.5–15.5)
WBC: 22.4 10*3/uL — ABNORMAL HIGH (ref 4.0–10.5)

## 2017-02-03 LAB — I-STAT CG4 LACTIC ACID, ED: Lactic Acid, Venous: 2.37 mmol/L (ref 0.5–1.9)

## 2017-02-03 LAB — URINALYSIS, ROUTINE W REFLEX MICROSCOPIC
Bilirubin Urine: NEGATIVE
GLUCOSE, UA: NEGATIVE mg/dL
Hgb urine dipstick: NEGATIVE
KETONES UR: NEGATIVE mg/dL
NITRITE: NEGATIVE
PH: 5 (ref 5.0–8.0)
PROTEIN: 100 mg/dL — AB
Specific Gravity, Urine: 1.028 (ref 1.005–1.030)

## 2017-02-03 LAB — CBC
HCT: 33.7 % — ABNORMAL LOW (ref 36.0–46.0)
Hemoglobin: 11.7 g/dL — ABNORMAL LOW (ref 12.0–15.0)
MCH: 34.4 pg — ABNORMAL HIGH (ref 26.0–34.0)
MCHC: 34.7 g/dL (ref 30.0–36.0)
MCV: 99.1 fL (ref 78.0–100.0)
PLATELETS: 290 10*3/uL (ref 150–400)
RBC: 3.4 MIL/uL — AB (ref 3.87–5.11)
RDW: 14.6 % (ref 11.5–15.5)
WBC: 18.4 10*3/uL — AB (ref 4.0–10.5)

## 2017-02-03 LAB — COMPREHENSIVE METABOLIC PANEL
ALBUMIN: 2.4 g/dL — AB (ref 3.5–5.0)
ALK PHOS: 70 U/L (ref 38–126)
ALT: 15 U/L (ref 14–54)
ANION GAP: 11 (ref 5–15)
AST: 37 U/L (ref 15–41)
BUN: 21 mg/dL — ABNORMAL HIGH (ref 6–20)
CO2: 21 mmol/L — AB (ref 22–32)
Calcium: 10.3 mg/dL (ref 8.9–10.3)
Chloride: 107 mmol/L (ref 101–111)
Creatinine, Ser: 1.21 mg/dL — ABNORMAL HIGH (ref 0.44–1.00)
GFR calc Af Amer: 46 mL/min — ABNORMAL LOW (ref >60)
GFR calc non Af Amer: 39 mL/min — ABNORMAL LOW (ref >60)
GLUCOSE: 104 mg/dL — AB (ref 65–99)
POTASSIUM: 3.5 mmol/L (ref 3.5–5.1)
SODIUM: 139 mmol/L (ref 135–145)
Total Bilirubin: 0.6 mg/dL (ref 0.3–1.2)
Total Protein: 6.7 g/dL (ref 6.5–8.1)

## 2017-02-03 LAB — I-STAT TROPONIN, ED: Troponin i, poc: 0 ng/mL (ref 0.00–0.08)

## 2017-02-03 LAB — PROTIME-INR
INR: 1.27
Prothrombin Time: 15.8 seconds — ABNORMAL HIGH (ref 11.4–15.2)

## 2017-02-03 MED ORDER — DORZOLAMIDE HCL 2 % OP SOLN
1.0000 [drp] | Freq: Two times a day (BID) | OPHTHALMIC | Status: DC
  Administered 2017-02-04 – 2017-02-06 (×5): 1 [drp] via OPHTHALMIC
  Filled 2017-02-03: qty 10

## 2017-02-03 MED ORDER — FENOFIBRATE 54 MG PO TABS
54.0000 mg | ORAL_TABLET | Freq: Every day | ORAL | Status: DC
  Administered 2017-02-04 – 2017-02-06 (×3): 54 mg via ORAL
  Filled 2017-02-03 (×3): qty 1

## 2017-02-03 MED ORDER — DM-GUAIFENESIN ER 30-600 MG PO TB12
1.0000 | ORAL_TABLET | Freq: Two times a day (BID) | ORAL | Status: DC | PRN
  Administered 2017-02-04 – 2017-02-06 (×4): 1 via ORAL
  Filled 2017-02-03 (×4): qty 1

## 2017-02-03 MED ORDER — DEXTROSE 5 % IV SOLN
500.0000 mg | Freq: Once | INTRAVENOUS | Status: AC
  Administered 2017-02-04: 500 mg via INTRAVENOUS
  Filled 2017-02-03: qty 500

## 2017-02-03 MED ORDER — SODIUM CHLORIDE 0.9 % IV BOLUS (SEPSIS): 1000.0000 mL | Freq: Once | INTRAVENOUS | Status: DC

## 2017-02-03 MED ORDER — SODIUM CHLORIDE 0.9 % IV SOLN: Freq: Once | INTRAVENOUS | Status: DC

## 2017-02-03 MED ORDER — OLOPATADINE HCL 0.1 % OP SOLN
1.0000 [drp] | Freq: Two times a day (BID) | OPHTHALMIC | Status: DC
  Administered 2017-02-04 – 2017-02-06 (×5): 1 [drp] via OPHTHALMIC
  Filled 2017-02-03: qty 5

## 2017-02-03 MED ORDER — SODIUM CHLORIDE 0.9 % IV SOLN
INTRAVENOUS | Status: DC
  Administered 2017-02-04 (×2): via INTRAVENOUS

## 2017-02-03 MED ORDER — DEXTROSE 5 % IV SOLN
1.0000 g | INTRAVENOUS | Status: DC
  Administered 2017-02-04 – 2017-02-05 (×2): 1 g via INTRAVENOUS
  Filled 2017-02-03 (×3): qty 10

## 2017-02-03 MED ORDER — CYCLOSPORINE 0.05 % OP EMUL
1.0000 [drp] | Freq: Two times a day (BID) | OPHTHALMIC | Status: DC
  Administered 2017-02-04 – 2017-02-06 (×5): 1 [drp] via OPHTHALMIC
  Filled 2017-02-03 (×5): qty 1

## 2017-02-03 MED ORDER — OXYCODONE-ACETAMINOPHEN 5-325 MG PO TABS
1.0000 | ORAL_TABLET | ORAL | Status: DC | PRN
  Administered 2017-02-04 – 2017-02-05 (×3): 1 via ORAL
  Filled 2017-02-03 (×3): qty 1

## 2017-02-03 MED ORDER — LEVALBUTEROL HCL 1.25 MG/0.5ML IN NEBU: 1.2500 mg | INHALATION_SOLUTION | Freq: Four times a day (QID) | RESPIRATORY_TRACT | Status: DC

## 2017-02-03 MED ORDER — DEXTROSE 5 % IV SOLN
500.0000 mg | INTRAVENOUS | Status: DC
  Administered 2017-02-04: 500 mg via INTRAVENOUS
  Filled 2017-02-03: qty 500

## 2017-02-03 MED ORDER — ENSURE ENLIVE PO LIQD
237.0000 mL | Freq: Two times a day (BID) | ORAL | Status: DC
  Administered 2017-02-04 – 2017-02-06 (×5): 237 mL via ORAL

## 2017-02-03 MED ORDER — SULFASALAZINE 500 MG PO TBEC: 500.0000 mg | DELAYED_RELEASE_TABLET | ORAL | Status: DC

## 2017-02-03 MED ORDER — LATANOPROST 0.005 % OP SOLN
1.0000 [drp] | Freq: Every day | OPHTHALMIC | Status: DC
  Administered 2017-02-04 – 2017-02-05 (×2): 1 [drp] via OPHTHALMIC
  Filled 2017-02-03: qty 2.5

## 2017-02-03 MED ORDER — DEXTROSE 5 % IV SOLN
1.0000 g | Freq: Once | INTRAVENOUS | Status: AC
  Administered 2017-02-04: 1 g via INTRAVENOUS
  Filled 2017-02-03: qty 10

## 2017-02-03 MED ORDER — FERROUS SULFATE 325 (65 FE) MG PO TABS
325.0000 mg | ORAL_TABLET | Freq: Every day | ORAL | Status: DC
  Administered 2017-02-04 – 2017-02-06 (×3): 325 mg via ORAL
  Filled 2017-02-03 (×3): qty 1

## 2017-02-03 MED ORDER — LEFLUNOMIDE 20 MG PO TABS
20.0000 mg | ORAL_TABLET | Freq: Every day | ORAL | Status: DC
Start: 2017-02-04 — End: 2017-02-06
  Administered 2017-02-04 – 2017-02-06 (×3): 20 mg via ORAL
  Filled 2017-02-03 (×3): qty 1

## 2017-02-03 MED ORDER — IOPAMIDOL (ISOVUE-300) INJECTION 61%
INTRAVENOUS | Status: AC
  Administered 2017-02-03: 75 mL
  Filled 2017-02-03: qty 100

## 2017-02-03 MED ORDER — SODIUM CHLORIDE 0.9 % IV BOLUS (SEPSIS): 500.0000 mL | Freq: Once | INTRAVENOUS | Status: DC

## 2017-02-03 MED ORDER — POLYETHYLENE GLYCOL 3350 17 G PO PACK
17.0000 g | PACK | Freq: Every day | ORAL | Status: DC | PRN
  Administered 2017-02-04 – 2017-02-06 (×2): 17 g via ORAL
  Filled 2017-02-03 (×2): qty 1

## 2017-02-03 NOTE — ED Notes (Signed)
Patient denies pain and is resting comfortably.  

## 2017-02-03 NOTE — ED Notes (Addendum)
Pt to CT.  Pt to go back to D30 after CT.  Report given to Krystal Clark, RN.
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
## 2017-02-03 NOTE — ED Notes (Signed)
QNS for type and screen and aptt.   Will notifify nurse.

## 2017-02-03 NOTE — ED Notes (Signed)
Patient transported to CT 
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
## 2017-02-04 ENCOUNTER — Inpatient Hospital Stay (HOSPITAL_COMMUNITY): Payer: No Typology Code available for payment source

## 2017-02-04 DIAGNOSIS — J9601 Acute respiratory failure with hypoxia: Secondary | ICD-10-CM | POA: Diagnosis present

## 2017-02-04 LAB — INFLUENZA PANEL BY PCR (TYPE A & B)
INFLAPCR: NEGATIVE
INFLBPCR: NEGATIVE

## 2017-02-04 LAB — RESPIRATORY PANEL BY PCR
ADENOVIRUS-RVPPCR: NOT DETECTED
Bordetella pertussis: NOT DETECTED
CORONAVIRUS 229E-RVPPCR: NOT DETECTED
CORONAVIRUS OC43-RVPPCR: NOT DETECTED
Chlamydophila pneumoniae: NOT DETECTED
Coronavirus HKU1: NOT DETECTED
Coronavirus NL63: NOT DETECTED
INFLUENZA A-RVPPCR: NOT DETECTED
INFLUENZA B-RVPPCR: NOT DETECTED
METAPNEUMOVIRUS-RVPPCR: NOT DETECTED
Mycoplasma pneumoniae: NOT DETECTED
PARAINFLUENZA VIRUS 1-RVPPCR: NOT DETECTED
PARAINFLUENZA VIRUS 2-RVPPCR: NOT DETECTED
PARAINFLUENZA VIRUS 3-RVPPCR: NOT DETECTED
PARAINFLUENZA VIRUS 4-RVPPCR: NOT DETECTED
RESPIRATORY SYNCYTIAL VIRUS-RVPPCR: NOT DETECTED
Rhinovirus / Enterovirus: DETECTED — AB

## 2017-02-04 LAB — EXPECTORATED SPUTUM ASSESSMENT W GRAM STAIN, RFLX TO RESP C

## 2017-02-04 LAB — CBC
HEMATOCRIT: 35.1 % — AB (ref 36.0–46.0)
Hemoglobin: 11.5 g/dL — ABNORMAL LOW (ref 12.0–15.0)
MCH: 32.2 pg (ref 26.0–34.0)
MCHC: 32.8 g/dL (ref 30.0–36.0)
MCV: 98.3 fL (ref 78.0–100.0)
PLATELETS: 269 10*3/uL (ref 150–400)
RBC: 3.57 MIL/uL — ABNORMAL LOW (ref 3.87–5.11)
RDW: 14.4 % (ref 11.5–15.5)
WBC: 16.9 10*3/uL — ABNORMAL HIGH (ref 4.0–10.5)

## 2017-02-04 LAB — BASIC METABOLIC PANEL
ANION GAP: 13 (ref 5–15)
BUN: 16 mg/dL (ref 6–20)
CHLORIDE: 107 mmol/L (ref 101–111)
CO2: 19 mmol/L — ABNORMAL LOW (ref 22–32)
Calcium: 10 mg/dL (ref 8.9–10.3)
Creatinine, Ser: 0.96 mg/dL (ref 0.44–1.00)
GFR, EST NON AFRICAN AMERICAN: 52 mL/min — AB (ref >60)
Glucose, Bld: 68 mg/dL (ref 65–99)
POTASSIUM: 3.8 mmol/L (ref 3.5–5.1)
Sodium: 139 mmol/L (ref 135–145)

## 2017-02-04 LAB — APTT: aPTT: 35 seconds (ref 24–36)

## 2017-02-04 LAB — MRSA PCR SCREENING: MRSA BY PCR: NEGATIVE

## 2017-02-04 LAB — TYPE AND SCREEN
ABO/RH(D): O POS
Antibody Screen: NEGATIVE

## 2017-02-04 LAB — TSH: TSH: 1.454 u[IU]/mL (ref 0.350–4.500)

## 2017-02-04 LAB — PROCALCITONIN: PROCALCITONIN: 3.48 ng/mL

## 2017-02-04 LAB — STREP PNEUMONIAE URINARY ANTIGEN: Strep Pneumo Urinary Antigen: NEGATIVE

## 2017-02-04 MED ORDER — SULFASALAZINE 500 MG PO TBEC
1000.0000 mg | DELAYED_RELEASE_TABLET | Freq: Every day | ORAL | Status: DC
  Administered 2017-02-04 – 2017-02-05 (×3): 1000 mg via ORAL
  Filled 2017-02-04 (×3): qty 2

## 2017-02-04 MED ORDER — LEVALBUTEROL HCL 0.63 MG/3ML IN NEBU
INHALATION_SOLUTION | RESPIRATORY_TRACT | Status: AC
  Filled 2017-02-04: qty 3

## 2017-02-04 MED ORDER — CEPASTAT 14.5 MG MT LOZG
1.0000 | LOZENGE | OROMUCOSAL | Status: DC | PRN
  Filled 2017-02-04 (×2): qty 9

## 2017-02-04 MED ORDER — FLUTICASONE PROPIONATE 50 MCG/ACT NA SUSP
2.0000 | NASAL | Status: DC | PRN
  Administered 2017-02-05: 2 via NASAL
  Filled 2017-02-04: qty 16

## 2017-02-04 MED ORDER — MENTHOL 3 MG MT LOZG
1.0000 | LOZENGE | OROMUCOSAL | Status: DC | PRN
  Filled 2017-02-04: qty 9

## 2017-02-04 MED ORDER — ACETAMINOPHEN 500 MG PO TABS
500.0000 mg | ORAL_TABLET | Freq: Three times a day (TID) | ORAL | Status: DC
  Administered 2017-02-04 – 2017-02-06 (×8): 500 mg via ORAL
  Filled 2017-02-04 (×8): qty 1

## 2017-02-04 MED ORDER — LEVALBUTEROL HCL 1.25 MG/0.5ML IN NEBU
1.2500 mg | INHALATION_SOLUTION | Freq: Three times a day (TID) | RESPIRATORY_TRACT | Status: DC
  Administered 2017-02-04 – 2017-02-06 (×5): 1.25 mg via RESPIRATORY_TRACT
  Filled 2017-02-04 (×5): qty 0.5

## 2017-02-04 MED ORDER — IBUPROFEN 200 MG PO TABS: 400.0000 mg | ORAL_TABLET | Freq: Three times a day (TID) | ORAL | Status: DC

## 2017-02-04 MED ORDER — IBUPROFEN 200 MG PO TABS
400.0000 mg | ORAL_TABLET | Freq: Three times a day (TID) | ORAL | Status: DC
  Administered 2017-02-04 – 2017-02-06 (×7): 400 mg via ORAL
  Filled 2017-02-04 (×7): qty 2

## 2017-02-04 MED ORDER — LEVALBUTEROL HCL 1.25 MG/0.5ML IN NEBU
1.2500 mg | INHALATION_SOLUTION | Freq: Four times a day (QID) | RESPIRATORY_TRACT | Status: DC
  Administered 2017-02-04 (×2): 1.25 mg via RESPIRATORY_TRACT
  Filled 2017-02-04 (×2): qty 0.5

## 2017-02-04 MED ORDER — SULFASALAZINE 500 MG PO TBEC
500.0000 mg | DELAYED_RELEASE_TABLET | Freq: Every day | ORAL | Status: DC
  Administered 2017-02-04 – 2017-02-06 (×3): 500 mg via ORAL
  Filled 2017-02-04 (×3): qty 1

## 2017-02-04 NOTE — Progress Notes (Signed)
0300: Report received from ED RN, Candy, stating pt with respiration rate in the upper 30s to 40s. Pt received on unit, does not appear to be in any distress. HOB @ 15 degrees per pt request. No accessory muscle use, no nasal flaring, no posturing, but pt continues to be very tachypnic. RT agrees with assessment.  0330: HOB raised to 30 degrees for pt to take pills. There seems to be slight improvement in tachypnea.  60: CCMD called for 1 min of sustained SVT @ 140bpm. Pt asymptomatic, resting comfortably.  0645: 4 more episodes of SVT. Pt endorses palpitations and some dizziness. Educated on vagal maneuver; successful in getting back to NSR.  0700: Handoff report given to [employer]
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
## 2017-02-04 NOTE — ED Notes (Signed)
Attempted report. RN not available. Will call back shortly.
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
## 2017-02-05 LAB — BASIC METABOLIC PANEL
ANION GAP: 8 (ref 5–15)
BUN: 20 mg/dL (ref 6–20)
CO2: 27 mmol/L (ref 22–32)
Calcium: 10.2 mg/dL (ref 8.9–10.3)
Chloride: 105 mmol/L (ref 101–111)
Creatinine, Ser: 1.05 mg/dL — ABNORMAL HIGH (ref 0.44–1.00)
GFR, EST AFRICAN AMERICAN: 54 mL/min — AB (ref >60)
GFR, EST NON AFRICAN AMERICAN: 47 mL/min — AB (ref >60)
Glucose, Bld: 110 mg/dL — ABNORMAL HIGH (ref 65–99)
POTASSIUM: 3.3 mmol/L — AB (ref 3.5–5.1)
SODIUM: 140 mmol/L (ref 135–145)

## 2017-02-05 LAB — LEGIONELLA PNEUMOPHILA SEROGP 1 UR AG: L. PNEUMOPHILA SEROGP 1 UR AG: NEGATIVE

## 2017-02-05 LAB — CBC
HEMATOCRIT: 30.3 % — AB (ref 36.0–46.0)
Hemoglobin: 10 g/dL — ABNORMAL LOW (ref 12.0–15.0)
MCH: 32.1 pg (ref 26.0–34.0)
MCHC: 33 g/dL (ref 30.0–36.0)
MCV: 97.1 fL (ref 78.0–100.0)
Platelets: 268 10*3/uL (ref 150–400)
RBC: 3.12 MIL/uL — AB (ref 3.87–5.11)
RDW: 14.3 % (ref 11.5–15.5)
WBC: 15.5 10*3/uL — AB (ref 4.0–10.5)

## 2017-02-05 LAB — URINE CULTURE

## 2017-02-05 LAB — MAGNESIUM: Magnesium: 1.7 mg/dL (ref 1.7–2.4)

## 2017-02-05 MED ORDER — POTASSIUM CHLORIDE CRYS ER 20 MEQ PO TBCR
40.0000 meq | EXTENDED_RELEASE_TABLET | Freq: Once | ORAL | Status: AC
  Administered 2017-02-05: 40 meq via ORAL
  Filled 2017-02-05: qty 2

## 2017-02-05 MED ORDER — LEVALBUTEROL HCL 1.25 MG/0.5ML IN NEBU: 1.2500 mg | INHALATION_SOLUTION | Freq: Four times a day (QID) | RESPIRATORY_TRACT | Status: DC | PRN

## 2017-02-05 MED ORDER — METHYLPREDNISOLONE 4 MG PO TABS
4.0000 mg | ORAL_TABLET | Freq: Every day | ORAL | Status: DC
  Administered 2017-02-06: 4 mg via ORAL
  Filled 2017-02-05: qty 1

## 2017-02-05 MED ORDER — AZITHROMYCIN 500 MG PO TABS
500.0000 mg | ORAL_TABLET | Freq: Every day | ORAL | Status: DC
  Administered 2017-02-05: 500 mg via ORAL
  Filled 2017-02-05: qty 1
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
## 2017-02-05 NOTE — Progress Notes (Signed)
2000: Report received from RN. Pt with multiple episodes of non-sustained SVT. The pt is able to vagal back to NSR with prompts. Daughter at bedside educated. Pt c/o back pain only. Shallow effort with IS (<230mL), possibly exacerbated by back/rib pain.  0200: Pt asleep. No SVT since beginning of shift.  0600: Pt continues resting comfortably. Pt states she is hopeful to get home this afternoon. I reinforced why she needs to be in the hospital. After transferring back to bed from the bedside commode, IS effort appears better, acheiving up to 585mL.  0700: Handoff report given to RN.

## 2017-02-05 NOTE — Discharge Instructions (Signed)

## 2017-02-06 LAB — BASIC METABOLIC PANEL
ANION GAP: 9 (ref 5–15)
BUN: 20 mg/dL (ref 6–20)
CALCIUM: 10.6 mg/dL — AB (ref 8.9–10.3)
CO2: 22 mmol/L (ref 22–32)
Chloride: 107 mmol/L (ref 101–111)
Creatinine, Ser: 0.94 mg/dL (ref 0.44–1.00)
GFR calc Af Amer: >60 mL/min (ref >60)
GFR, EST NON AFRICAN AMERICAN: 53 mL/min — AB (ref >60)
GLUCOSE: 71 mg/dL (ref 65–99)
Potassium: 4.9 mmol/L (ref 3.5–5.1)
SODIUM: 138 mmol/L (ref 135–145)

## 2017-02-06 LAB — CBC
HEMATOCRIT: 29.9 % — AB (ref 36.0–46.0)
Hemoglobin: 9.8 g/dL — ABNORMAL LOW (ref 12.0–15.0)
MCH: 32.7 pg (ref 26.0–34.0)
MCHC: 32.8 g/dL (ref 30.0–36.0)
MCV: 99.7 fL (ref 78.0–100.0)
Platelets: 250 10*3/uL (ref 150–400)
RBC: 3 MIL/uL — ABNORMAL LOW (ref 3.87–5.11)
RDW: 15.1 % (ref 11.5–15.5)
WBC: 15.3 10*3/uL — AB (ref 4.0–10.5)

## 2017-02-06 MED ORDER — CEFPODOXIME PROXETIL 100 MG PO TABS: 100.0000 mg | ORAL_TABLET | Freq: Two times a day (BID) | ORAL | 0 refills | Status: AC

## 2017-02-06 MED ORDER — IBUPROFEN 400 MG PO TABS: 400.0000 mg | ORAL_TABLET | Freq: Two times a day (BID) | ORAL | 0 refills | Status: DC | PRN

## 2017-02-06 MED ORDER — FLUTICASONE PROPIONATE 50 MCG/ACT NA SUSP: 2.0000 | NASAL | 0 refills | Status: DC | PRN

## 2017-02-06 MED ORDER — AZITHROMYCIN 500 MG PO TABS: 500.0000 mg | ORAL_TABLET | Freq: Every day | ORAL | 0 refills | Status: AC

## 2017-02-06 MED ORDER — OXYCODONE-ACETAMINOPHEN 5-325 MG PO TABS: 1.0000 | ORAL_TABLET | Freq: Three times a day (TID) | ORAL | 0 refills | Status: AC | PRN

## 2017-02-06 MED ORDER — DM-GUAIFENESIN ER 30-600 MG PO TB12: 1.0000 | ORAL_TABLET | Freq: Two times a day (BID) | ORAL | 0 refills | Status: DC | PRN

## 2017-02-06 NOTE — Discharge Summary (Signed)
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 colonization surveillance program. It is not intended to diagnose MRSA infection nor to guide or monitor treatment for MRSA infections.   Respiratory Panel by PCR     Status: Abnormal   Collection Time: 02/04/17  3:44 AM  Result Value Ref Range Status   Adenovirus NOT DETECTED NOT DETECTED Final   Coronavirus 229E NOT DETECTED NOT DETECTED Final   Coronavirus HKU1 NOT DETECTED NOT DETECTED Final   Coronavirus NL63 NOT DETECTED NOT DETECTED Final   Coronavirus OC43 NOT DETECTED NOT DETECTED Final   Metapneumovirus NOT DETECTED NOT DETECTED Final   Rhinovirus / Enterovirus DETECTED (A) NOT DETECTED Final   Influenza A NOT DETECTED NOT DETECTED Final   Influenza B NOT DETECTED NOT DETECTED Final   Parainfluenza Virus 1 NOT DETECTED NOT DETECTED Final    Parainfluenza Virus 2 NOT DETECTED NOT DETECTED Final   Parainfluenza Virus 3 NOT DETECTED NOT DETECTED Final   Parainfluenza Virus 4 NOT DETECTED NOT DETECTED Final   Respiratory Syncytial Virus NOT DETECTED NOT DETECTED Final   Bordetella pertussis NOT DETECTED NOT DETECTED Final   Chlamydophila pneumoniae NOT DETECTED NOT DETECTED Final   Mycoplasma pneumoniae NOT DETECTED NOT DETECTED Final     Labs: BNP (last 3 results) No results for input(s): BNP in the last 8760 hours. Basic Metabolic Panel: Recent Labs  Lab 02/03/17 2003 02/04/17 0723 02/05/17 0409 02/06/17 0549  NA 139 139  140 138  K 3.5 3.8 3.3* 4.9  CL 107 107 105 107  CO2 21* 19* 27 22  GLUCOSE 104* 68 110* 71  BUN 21* 16 20 20   CREATININE 1.21* 0.96 1.05* 0.94  CALCIUM 10.3 10.0 10.2 10.6*  MG  --   --  1.7  --    Liver Function Tests: Recent Labs  Lab 02/03/17 2003  AST 37  ALT 15  ALKPHOS 70  BILITOT 0.6  PROT 6.7  ALBUMIN 2.4*   No results for input(s): LIPASE, AMYLASE in the last 168 hours. No results for input(s): AMMONIA in the last 168 hours. CBC: Recent Labs  Lab 02/03/17 2003 02/03/17 2257 02/04/17 0723 02/05/17 0409 02/06/17 0549  WBC 22.4* 18.4* 16.9* 15.5* 15.3*  NEUTROABS 20.0*  --   --   --   --   HGB 9.7* 11.7* 11.5* 10.0* 9.8*  HCT 29.1* 33.7* 35.1* 30.3* 29.9*  MCV 98.3 99.1 98.3 97.1 99.7  PLT 277 290 269 268 250   Cardiac Enzymes: No results for input(s): CKTOTAL, CKMB, CKMBINDEX, TROPONINI in the last 168 hours. BNP: Invalid input(s): POCBNP CBG: No results for input(s): GLUCAP in the last 168 hours. D-Dimer No results for input(s): DDIMER in the last 72 hours. Hgb A1c No results for input(s): HGBA1C in the last 72 hours. Lipid Profile No results for input(s): CHOL, HDL, LDLCALC, TRIG, CHOLHDL, LDLDIRECT in the last 72 hours. Thyroid function studies Recent Labs    02/04/17 0723  TSH 1.454   Anemia work up No results for input(s): VITAMINB12, FOLATE,  FERRITIN, TIBC, IRON, RETICCTPCT in the last 72 hours. Urinalysis    Component Value Date/Time   COLORURINE AMBER (A) 02/03/2017 2246   APPEARANCEUR HAZY (A) 02/03/2017 2246   LABSPEC 1.028 02/03/2017 2246   PHURINE 5.0 02/03/2017 2246   GLUCOSEU NEGATIVE 02/03/2017 2246   HGBUR NEGATIVE 02/03/2017 2246   BILIRUBINUR NEGATIVE 02/03/2017 2246   KETONESUR NEGATIVE 02/03/2017 2246   PROTEINUR 100 (A) 02/03/2017 2246   UROBILINOGEN 0.2 08/28/2013 0020   NITRITE NEGATIVE 02/03/2017 2246   LEUKOCYTESUR MODERATE (A) 02/03/2017 2246   Sepsis Labs Invalid input(s): PROCALCITONIN,  WBC,  LACTICIDVEN Microbiology Recent Results (from the past 240 hour(s))  Urine Culture     Status: Abnormal   Collection Time: 02/03/17 10:46 PM  Result Value Ref Range Status   Specimen Description URINE, RANDOM  Final   Special Requests NONE  Final   Culture MULTIPLE SPECIES PRESENT, SUGGEST RECOLLECTION (A)  Final   Report Status 02/05/2017 FINAL  Final  Culture, blood (routine x 2)     Status: None (Preliminary result)   Collection Time: 02/03/17 10:50 PM  Result Value Ref Range Status   Specimen Description BLOOD RIGHT HAND  Final   Special Requests IN PEDIATRIC BOTTLE Blood Culture adequate volume  Final   Culture NO GROWTH 1 DAY  Final   Report Status PENDING  Incomplete  Culture, blood (routine x 2)     Status: None (Preliminary result)   Collection Time: 02/03/17 11:05 PM  Result Value Ref Range Status   Specimen Description BLOOD LEFT WRIST  Final   Special Requests IN PEDIATRIC BOTTLE Blood Culture adequate volume  Final   Culture NO GROWTH 1 DAY  Final   Report Status PENDING  Incomplete  Culture, sputum-assessment     Status: None   Collection Time: 02/03/17 11:12 PM  Result Value Ref Range Status   Specimen Description SPUTUM  Final   Special Requests  NONE  Final   Sputum evaluation THIS SPECIMEN IS ACCEPTABLE FOR SPUTUM CULTURE  Final   Report Status 02/04/2017 FINAL  Final  Culture,  respiratory (NON-Expectorated)     Status: None (Preliminary result)   Collection Time: 02/03/17 11:12 PM  Result Value Ref Range Status   Specimen Description SPUTUM  Final   Special Requests NONE Reflexed from [id number]  Final   Gram Stain   Final    MODERATE WBC PRESENT, PREDOMINANTLY PMN FEW GRAM NEGATIVE RODS FEW GRAM POSITIVE COCCI RARE GRAM POSITIVE RODS    Culture CULTURE REINCUBATED FOR BETTER GROWTH  Final   Report Status PENDING  Incomplete  MRSA PCR Screening     Status: None   Collection Time: 02/04/17  3:03 AM  Result Value Ref Range Status   MRSA by PCR NEGATIVE NEGATIVE Final    Comment:        The GeneXpert MRSA Assay (FDA approved for NASAL specimens only), is one component of a comprehensive MRSA colonization surveillance program. It is not intended to diagnose MRSA infection nor to guide or monitor treatment for MRSA infections.   Respiratory Panel by PCR     Status: Abnormal   Collection Time: 02/04/17  3:44 AM  Result Value Ref Range Status   Adenovirus NOT DETECTED NOT DETECTED Final   Coronavirus 229E NOT DETECTED NOT DETECTED Final   Coronavirus HKU1 NOT DETECTED NOT DETECTED Final   Coronavirus NL63 NOT DETECTED NOT DETECTED Final   Coronavirus OC43 NOT DETECTED NOT DETECTED Final   Metapneumovirus NOT DETECTED NOT DETECTED Final   Rhinovirus / Enterovirus DETECTED (A) NOT DETECTED Final   Influenza A NOT DETECTED NOT DETECTED Final   Influenza B NOT DETECTED NOT DETECTED Final   Parainfluenza Virus 1 NOT DETECTED NOT DETECTED Final   Parainfluenza Virus 2 NOT DETECTED NOT DETECTED Final   Parainfluenza Virus 3 NOT DETECTED NOT DETECTED Final   Parainfluenza Virus 4 NOT DETECTED NOT DETECTED Final   Respiratory Syncytial Virus NOT DETECTED NOT DETECTED Final   Bordetella pertussis NOT DETECTED NOT DETECTED Final   Chlamydophila pneumoniae NOT DETECTED NOT DETECTED Final   Mycoplasma pneumoniae NOT DETECTED NOT DETECTED Final     Time  coordinating discharge: 40 minutes  SIGNED:  Dessa Phi, DO [facility] Pager [phone]  If 7PM-7AM, please contact night-coverage [email or web address] Password [facility] 02/06/2017, 11:51 AM
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
## 2017-02-06 NOTE — Progress Notes (Signed)
Patient discharged from home with son and granddaughter. Patient stable ambulated in room to w/c for discharge instructions given to son with verbal understanding

## 2017-02-07 LAB — CULTURE, RESPIRATORY W GRAM STAIN

## 2017-02-07 LAB — CULTURE, RESPIRATORY: CULTURE: NORMAL

## 2017-02-09 DIAGNOSIS — S22089D Unspecified fracture of T11-T12 vertebra, subsequent encounter for fracture with routine healing: Secondary | ICD-10-CM | POA: Diagnosis not present

## 2017-02-09 DIAGNOSIS — I129 Hypertensive chronic kidney disease with stage 1 through stage 4 chronic kidney disease, or unspecified chronic kidney disease: Secondary | ICD-10-CM | POA: Diagnosis not present

## 2017-02-09 DIAGNOSIS — S2241XB Multiple fractures of ribs, right side, initial encounter for open fracture: Secondary | ICD-10-CM | POA: Diagnosis not present

## 2017-02-09 DIAGNOSIS — C642 Malignant neoplasm of left kidney, except renal pelvis: Secondary | ICD-10-CM | POA: Diagnosis not present

## 2017-02-09 DIAGNOSIS — Z905 Acquired absence of kidney: Secondary | ICD-10-CM | POA: Diagnosis not present

## 2017-02-09 DIAGNOSIS — N183 Chronic kidney disease, stage 3 (moderate): Secondary | ICD-10-CM | POA: Diagnosis not present

## 2017-02-09 DIAGNOSIS — M069 Rheumatoid arthritis, unspecified: Secondary | ICD-10-CM | POA: Diagnosis not present

## 2017-02-09 DIAGNOSIS — F0391 Unspecified dementia with behavioral disturbance: Secondary | ICD-10-CM | POA: Diagnosis not present

## 2017-02-09 DIAGNOSIS — M1991 Primary osteoarthritis, unspecified site: Secondary | ICD-10-CM | POA: Diagnosis not present

## 2017-02-09 DIAGNOSIS — I480 Paroxysmal atrial fibrillation: Secondary | ICD-10-CM | POA: Diagnosis not present

## 2017-02-09 DIAGNOSIS — J45909 Unspecified asthma, uncomplicated: Secondary | ICD-10-CM | POA: Diagnosis not present

## 2017-02-09 DIAGNOSIS — E43 Unspecified severe protein-calorie malnutrition: Secondary | ICD-10-CM | POA: Diagnosis not present

## 2017-02-09 LAB — CULTURE, BLOOD (ROUTINE X 2)
Culture: NO GROWTH
Culture: NO GROWTH
SPECIAL REQUESTS: ADEQUATE
Special Requests: ADEQUATE

## 2017-02-11 DIAGNOSIS — I129 Hypertensive chronic kidney disease with stage 1 through stage 4 chronic kidney disease, or unspecified chronic kidney disease: Secondary | ICD-10-CM | POA: Diagnosis not present

## 2017-02-11 DIAGNOSIS — N183 Chronic kidney disease, stage 3 (moderate): Secondary | ICD-10-CM | POA: Diagnosis not present

## 2017-02-11 DIAGNOSIS — M1991 Primary osteoarthritis, unspecified site: Secondary | ICD-10-CM | POA: Diagnosis not present

## 2017-02-11 DIAGNOSIS — S2241XB Multiple fractures of ribs, right side, initial encounter for open fracture: Secondary | ICD-10-CM | POA: Diagnosis not present

## 2017-02-11 DIAGNOSIS — S22089D Unspecified fracture of T11-T12 vertebra, subsequent encounter for fracture with routine healing: Secondary | ICD-10-CM | POA: Diagnosis not present

## 2017-02-11 DIAGNOSIS — M069 Rheumatoid arthritis, unspecified: Secondary | ICD-10-CM | POA: Diagnosis not present

## 2017-02-12 DIAGNOSIS — Z681 Body mass index (BMI) 19 or less, adult: Secondary | ICD-10-CM | POA: Diagnosis not present

## 2017-02-12 DIAGNOSIS — S22009A Unspecified fracture of unspecified thoracic vertebra, initial encounter for closed fracture: Secondary | ICD-10-CM | POA: Diagnosis not present

## 2017-02-12 DIAGNOSIS — S2249XA Multiple fractures of ribs, unspecified side, initial encounter for closed fracture: Secondary | ICD-10-CM | POA: Diagnosis not present

## 2017-02-12 DIAGNOSIS — D72829 Elevated white blood cell count, unspecified: Secondary | ICD-10-CM | POA: Diagnosis not present

## 2017-02-12 DIAGNOSIS — K5900 Constipation, unspecified: Secondary | ICD-10-CM | POA: Diagnosis not present

## 2017-02-12 DIAGNOSIS — E46 Unspecified protein-calorie malnutrition: Secondary | ICD-10-CM | POA: Diagnosis not present

## 2017-02-12 DIAGNOSIS — J189 Pneumonia, unspecified organism: Secondary | ICD-10-CM | POA: Diagnosis not present

## 2017-02-12 DIAGNOSIS — S36113A Laceration of liver, unspecified degree, initial encounter: Secondary | ICD-10-CM | POA: Diagnosis not present

## 2017-02-13 DIAGNOSIS — M1991 Primary osteoarthritis, unspecified site: Secondary | ICD-10-CM | POA: Diagnosis not present

## 2017-02-13 DIAGNOSIS — M069 Rheumatoid arthritis, unspecified: Secondary | ICD-10-CM | POA: Diagnosis not present

## 2017-02-13 DIAGNOSIS — N183 Chronic kidney disease, stage 3 (moderate): Secondary | ICD-10-CM | POA: Diagnosis not present

## 2017-02-13 DIAGNOSIS — S22089D Unspecified fracture of T11-T12 vertebra, subsequent encounter for fracture with routine healing: Secondary | ICD-10-CM | POA: Diagnosis not present

## 2017-02-13 DIAGNOSIS — I129 Hypertensive chronic kidney disease with stage 1 through stage 4 chronic kidney disease, or unspecified chronic kidney disease: Secondary | ICD-10-CM | POA: Diagnosis not present

## 2017-02-13 DIAGNOSIS — S2241XB Multiple fractures of ribs, right side, initial encounter for open fracture: Secondary | ICD-10-CM | POA: Diagnosis not present

## 2017-02-14 DIAGNOSIS — S22089D Unspecified fracture of T11-T12 vertebra, subsequent encounter for fracture with routine healing: Secondary | ICD-10-CM | POA: Diagnosis not present

## 2017-02-14 DIAGNOSIS — S2241XB Multiple fractures of ribs, right side, initial encounter for open fracture: Secondary | ICD-10-CM | POA: Diagnosis not present

## 2017-02-14 DIAGNOSIS — N183 Chronic kidney disease, stage 3 (moderate): Secondary | ICD-10-CM | POA: Diagnosis not present

## 2017-02-14 DIAGNOSIS — I129 Hypertensive chronic kidney disease with stage 1 through stage 4 chronic kidney disease, or unspecified chronic kidney disease: Secondary | ICD-10-CM | POA: Diagnosis not present

## 2017-02-14 DIAGNOSIS — M1991 Primary osteoarthritis, unspecified site: Secondary | ICD-10-CM | POA: Diagnosis not present

## 2017-02-14 DIAGNOSIS — M069 Rheumatoid arthritis, unspecified: Secondary | ICD-10-CM | POA: Diagnosis not present

## 2017-02-15 DIAGNOSIS — M069 Rheumatoid arthritis, unspecified: Secondary | ICD-10-CM | POA: Diagnosis not present

## 2017-02-15 DIAGNOSIS — I129 Hypertensive chronic kidney disease with stage 1 through stage 4 chronic kidney disease, or unspecified chronic kidney disease: Secondary | ICD-10-CM | POA: Diagnosis not present

## 2017-02-15 DIAGNOSIS — N183 Chronic kidney disease, stage 3 (moderate): Secondary | ICD-10-CM | POA: Diagnosis not present

## 2017-02-15 DIAGNOSIS — S22089D Unspecified fracture of T11-T12 vertebra, subsequent encounter for fracture with routine healing: Secondary | ICD-10-CM | POA: Diagnosis not present

## 2017-02-15 DIAGNOSIS — S2241XB Multiple fractures of ribs, right side, initial encounter for open fracture: Secondary | ICD-10-CM | POA: Diagnosis not present

## 2017-02-15 DIAGNOSIS — M1991 Primary osteoarthritis, unspecified site: Secondary | ICD-10-CM | POA: Diagnosis not present

## 2017-02-18 DIAGNOSIS — I129 Hypertensive chronic kidney disease with stage 1 through stage 4 chronic kidney disease, or unspecified chronic kidney disease: Secondary | ICD-10-CM | POA: Diagnosis not present

## 2017-02-18 DIAGNOSIS — M1991 Primary osteoarthritis, unspecified site: Secondary | ICD-10-CM | POA: Diagnosis not present

## 2017-02-18 DIAGNOSIS — S22089D Unspecified fracture of T11-T12 vertebra, subsequent encounter for fracture with routine healing: Secondary | ICD-10-CM | POA: Diagnosis not present

## 2017-02-18 DIAGNOSIS — S2241XB Multiple fractures of ribs, right side, initial encounter for open fracture: Secondary | ICD-10-CM | POA: Diagnosis not present

## 2017-02-18 DIAGNOSIS — N183 Chronic kidney disease, stage 3 (moderate): Secondary | ICD-10-CM | POA: Diagnosis not present

## 2017-02-18 DIAGNOSIS — M069 Rheumatoid arthritis, unspecified: Secondary | ICD-10-CM | POA: Diagnosis not present

## 2017-02-19 DIAGNOSIS — M1991 Primary osteoarthritis, unspecified site: Secondary | ICD-10-CM | POA: Diagnosis not present

## 2017-02-19 DIAGNOSIS — S22089D Unspecified fracture of T11-T12 vertebra, subsequent encounter for fracture with routine healing: Secondary | ICD-10-CM | POA: Diagnosis not present

## 2017-02-19 DIAGNOSIS — N183 Chronic kidney disease, stage 3 (moderate): Secondary | ICD-10-CM | POA: Diagnosis not present

## 2017-02-19 DIAGNOSIS — S2241XB Multiple fractures of ribs, right side, initial encounter for open fracture: Secondary | ICD-10-CM | POA: Diagnosis not present

## 2017-02-19 DIAGNOSIS — M069 Rheumatoid arthritis, unspecified: Secondary | ICD-10-CM | POA: Diagnosis not present

## 2017-02-19 DIAGNOSIS — I129 Hypertensive chronic kidney disease with stage 1 through stage 4 chronic kidney disease, or unspecified chronic kidney disease: Secondary | ICD-10-CM | POA: Diagnosis not present

## 2017-02-20 DIAGNOSIS — S22089D Unspecified fracture of T11-T12 vertebra, subsequent encounter for fracture with routine healing: Secondary | ICD-10-CM | POA: Diagnosis not present

## 2017-02-20 DIAGNOSIS — M069 Rheumatoid arthritis, unspecified: Secondary | ICD-10-CM | POA: Diagnosis not present

## 2017-02-20 DIAGNOSIS — N183 Chronic kidney disease, stage 3 (moderate): Secondary | ICD-10-CM | POA: Diagnosis not present

## 2017-02-20 DIAGNOSIS — S2241XB Multiple fractures of ribs, right side, initial encounter for open fracture: Secondary | ICD-10-CM | POA: Diagnosis not present

## 2017-02-20 DIAGNOSIS — I129 Hypertensive chronic kidney disease with stage 1 through stage 4 chronic kidney disease, or unspecified chronic kidney disease: Secondary | ICD-10-CM | POA: Diagnosis not present

## 2017-02-20 DIAGNOSIS — M1991 Primary osteoarthritis, unspecified site: Secondary | ICD-10-CM | POA: Diagnosis not present

## 2017-02-21 DIAGNOSIS — N183 Chronic kidney disease, stage 3 (moderate): Secondary | ICD-10-CM | POA: Diagnosis not present

## 2017-02-21 DIAGNOSIS — M069 Rheumatoid arthritis, unspecified: Secondary | ICD-10-CM | POA: Diagnosis not present

## 2017-02-21 DIAGNOSIS — S22089D Unspecified fracture of T11-T12 vertebra, subsequent encounter for fracture with routine healing: Secondary | ICD-10-CM | POA: Diagnosis not present

## 2017-02-21 DIAGNOSIS — M1991 Primary osteoarthritis, unspecified site: Secondary | ICD-10-CM | POA: Diagnosis not present

## 2017-02-21 DIAGNOSIS — I129 Hypertensive chronic kidney disease with stage 1 through stage 4 chronic kidney disease, or unspecified chronic kidney disease: Secondary | ICD-10-CM | POA: Diagnosis not present

## 2017-02-21 DIAGNOSIS — S2241XB Multiple fractures of ribs, right side, initial encounter for open fracture: Secondary | ICD-10-CM | POA: Diagnosis not present

## 2017-02-22 DIAGNOSIS — S22089D Unspecified fracture of T11-T12 vertebra, subsequent encounter for fracture with routine healing: Secondary | ICD-10-CM | POA: Diagnosis not present

## 2017-02-22 DIAGNOSIS — N183 Chronic kidney disease, stage 3 (moderate): Secondary | ICD-10-CM | POA: Diagnosis not present

## 2017-02-22 DIAGNOSIS — S2241XB Multiple fractures of ribs, right side, initial encounter for open fracture: Secondary | ICD-10-CM | POA: Diagnosis not present

## 2017-02-22 DIAGNOSIS — M069 Rheumatoid arthritis, unspecified: Secondary | ICD-10-CM | POA: Diagnosis not present

## 2017-02-22 DIAGNOSIS — M1991 Primary osteoarthritis, unspecified site: Secondary | ICD-10-CM | POA: Diagnosis not present

## 2017-02-22 DIAGNOSIS — I129 Hypertensive chronic kidney disease with stage 1 through stage 4 chronic kidney disease, or unspecified chronic kidney disease: Secondary | ICD-10-CM | POA: Diagnosis not present

## 2017-02-26 DIAGNOSIS — S2239XA Fracture of one rib, unspecified side, initial encounter for closed fracture: Secondary | ICD-10-CM | POA: Diagnosis not present

## 2017-02-26 DIAGNOSIS — M1991 Primary osteoarthritis, unspecified site: Secondary | ICD-10-CM | POA: Diagnosis not present

## 2017-02-26 DIAGNOSIS — S22089D Unspecified fracture of T11-T12 vertebra, subsequent encounter for fracture with routine healing: Secondary | ICD-10-CM | POA: Diagnosis not present

## 2017-02-26 DIAGNOSIS — S2241XB Multiple fractures of ribs, right side, initial encounter for open fracture: Secondary | ICD-10-CM | POA: Diagnosis not present

## 2017-02-26 DIAGNOSIS — M069 Rheumatoid arthritis, unspecified: Secondary | ICD-10-CM | POA: Diagnosis not present

## 2017-02-26 DIAGNOSIS — I129 Hypertensive chronic kidney disease with stage 1 through stage 4 chronic kidney disease, or unspecified chronic kidney disease: Secondary | ICD-10-CM | POA: Diagnosis not present

## 2017-02-26 DIAGNOSIS — N183 Chronic kidney disease, stage 3 (moderate): Secondary | ICD-10-CM | POA: Diagnosis not present

## 2017-02-27 DIAGNOSIS — N183 Chronic kidney disease, stage 3 (moderate): Secondary | ICD-10-CM | POA: Diagnosis not present

## 2017-02-27 DIAGNOSIS — S22089D Unspecified fracture of T11-T12 vertebra, subsequent encounter for fracture with routine healing: Secondary | ICD-10-CM | POA: Diagnosis not present

## 2017-02-27 DIAGNOSIS — I129 Hypertensive chronic kidney disease with stage 1 through stage 4 chronic kidney disease, or unspecified chronic kidney disease: Secondary | ICD-10-CM | POA: Diagnosis not present

## 2017-02-27 DIAGNOSIS — M069 Rheumatoid arthritis, unspecified: Secondary | ICD-10-CM | POA: Diagnosis not present

## 2017-02-27 DIAGNOSIS — M1991 Primary osteoarthritis, unspecified site: Secondary | ICD-10-CM | POA: Diagnosis not present

## 2017-02-27 DIAGNOSIS — S2241XB Multiple fractures of ribs, right side, initial encounter for open fracture: Secondary | ICD-10-CM | POA: Diagnosis not present

## 2017-02-28 DIAGNOSIS — S22089D Unspecified fracture of T11-T12 vertebra, subsequent encounter for fracture with routine healing: Secondary | ICD-10-CM | POA: Diagnosis not present

## 2017-02-28 DIAGNOSIS — I129 Hypertensive chronic kidney disease with stage 1 through stage 4 chronic kidney disease, or unspecified chronic kidney disease: Secondary | ICD-10-CM | POA: Diagnosis not present

## 2017-02-28 DIAGNOSIS — N183 Chronic kidney disease, stage 3 (moderate): Secondary | ICD-10-CM | POA: Diagnosis not present

## 2017-02-28 DIAGNOSIS — M069 Rheumatoid arthritis, unspecified: Secondary | ICD-10-CM | POA: Diagnosis not present

## 2017-02-28 DIAGNOSIS — S2241XB Multiple fractures of ribs, right side, initial encounter for open fracture: Secondary | ICD-10-CM | POA: Diagnosis not present

## 2017-02-28 DIAGNOSIS — M1991 Primary osteoarthritis, unspecified site: Secondary | ICD-10-CM | POA: Diagnosis not present

## 2017-03-02 DIAGNOSIS — M1991 Primary osteoarthritis, unspecified site: Secondary | ICD-10-CM | POA: Diagnosis not present

## 2017-03-02 DIAGNOSIS — N183 Chronic kidney disease, stage 3 (moderate): Secondary | ICD-10-CM | POA: Diagnosis not present

## 2017-03-02 DIAGNOSIS — M069 Rheumatoid arthritis, unspecified: Secondary | ICD-10-CM | POA: Diagnosis not present

## 2017-03-02 DIAGNOSIS — I129 Hypertensive chronic kidney disease with stage 1 through stage 4 chronic kidney disease, or unspecified chronic kidney disease: Secondary | ICD-10-CM | POA: Diagnosis not present

## 2017-03-02 DIAGNOSIS — S22089D Unspecified fracture of T11-T12 vertebra, subsequent encounter for fracture with routine healing: Secondary | ICD-10-CM | POA: Diagnosis not present

## 2017-03-02 DIAGNOSIS — S2241XB Multiple fractures of ribs, right side, initial encounter for open fracture: Secondary | ICD-10-CM | POA: Diagnosis not present

## 2017-03-04 DIAGNOSIS — M069 Rheumatoid arthritis, unspecified: Secondary | ICD-10-CM | POA: Diagnosis not present

## 2017-03-04 DIAGNOSIS — S2241XB Multiple fractures of ribs, right side, initial encounter for open fracture: Secondary | ICD-10-CM | POA: Diagnosis not present

## 2017-03-04 DIAGNOSIS — I129 Hypertensive chronic kidney disease with stage 1 through stage 4 chronic kidney disease, or unspecified chronic kidney disease: Secondary | ICD-10-CM | POA: Diagnosis not present

## 2017-03-04 DIAGNOSIS — S22089D Unspecified fracture of T11-T12 vertebra, subsequent encounter for fracture with routine healing: Secondary | ICD-10-CM | POA: Diagnosis not present

## 2017-03-04 DIAGNOSIS — M1991 Primary osteoarthritis, unspecified site: Secondary | ICD-10-CM | POA: Diagnosis not present

## 2017-03-04 DIAGNOSIS — N183 Chronic kidney disease, stage 3 (moderate): Secondary | ICD-10-CM | POA: Diagnosis not present

## 2017-03-06 DIAGNOSIS — M1991 Primary osteoarthritis, unspecified site: Secondary | ICD-10-CM | POA: Diagnosis not present

## 2017-03-06 DIAGNOSIS — S2241XB Multiple fractures of ribs, right side, initial encounter for open fracture: Secondary | ICD-10-CM | POA: Diagnosis not present

## 2017-03-06 DIAGNOSIS — M069 Rheumatoid arthritis, unspecified: Secondary | ICD-10-CM | POA: Diagnosis not present

## 2017-03-06 DIAGNOSIS — I129 Hypertensive chronic kidney disease with stage 1 through stage 4 chronic kidney disease, or unspecified chronic kidney disease: Secondary | ICD-10-CM | POA: Diagnosis not present

## 2017-03-06 DIAGNOSIS — N183 Chronic kidney disease, stage 3 (moderate): Secondary | ICD-10-CM | POA: Diagnosis not present

## 2017-03-06 DIAGNOSIS — S22089D Unspecified fracture of T11-T12 vertebra, subsequent encounter for fracture with routine healing: Secondary | ICD-10-CM | POA: Diagnosis not present

## 2017-03-07 DIAGNOSIS — S2241XB Multiple fractures of ribs, right side, initial encounter for open fracture: Secondary | ICD-10-CM | POA: Diagnosis not present

## 2017-03-07 DIAGNOSIS — M1991 Primary osteoarthritis, unspecified site: Secondary | ICD-10-CM | POA: Diagnosis not present

## 2017-03-07 DIAGNOSIS — S22089D Unspecified fracture of T11-T12 vertebra, subsequent encounter for fracture with routine healing: Secondary | ICD-10-CM | POA: Diagnosis not present

## 2017-03-07 DIAGNOSIS — I129 Hypertensive chronic kidney disease with stage 1 through stage 4 chronic kidney disease, or unspecified chronic kidney disease: Secondary | ICD-10-CM | POA: Diagnosis not present

## 2017-03-07 DIAGNOSIS — M069 Rheumatoid arthritis, unspecified: Secondary | ICD-10-CM | POA: Diagnosis not present

## 2017-03-07 DIAGNOSIS — N183 Chronic kidney disease, stage 3 (moderate): Secondary | ICD-10-CM | POA: Diagnosis not present

## 2017-05-03 DIAGNOSIS — G4700 Insomnia, unspecified: Secondary | ICD-10-CM | POA: Diagnosis not present

## 2017-05-03 DIAGNOSIS — M069 Rheumatoid arthritis, unspecified: Secondary | ICD-10-CM | POA: Diagnosis not present

## 2017-05-03 DIAGNOSIS — Z681 Body mass index (BMI) 19 or less, adult: Secondary | ICD-10-CM | POA: Diagnosis not present

## 2017-05-03 DIAGNOSIS — R634 Abnormal weight loss: Secondary | ICD-10-CM | POA: Diagnosis not present

## 2017-05-03 DIAGNOSIS — I4891 Unspecified atrial fibrillation: Secondary | ICD-10-CM | POA: Diagnosis not present

## 2017-05-03 DIAGNOSIS — D649 Anemia, unspecified: Secondary | ICD-10-CM | POA: Diagnosis not present

## 2017-05-03 DIAGNOSIS — E782 Mixed hyperlipidemia: Secondary | ICD-10-CM | POA: Diagnosis not present

## 2017-07-31 DIAGNOSIS — D649 Anemia, unspecified: Secondary | ICD-10-CM | POA: Diagnosis not present

## 2017-07-31 DIAGNOSIS — G4700 Insomnia, unspecified: Secondary | ICD-10-CM | POA: Diagnosis not present

## 2017-07-31 DIAGNOSIS — R634 Abnormal weight loss: Secondary | ICD-10-CM | POA: Diagnosis not present

## 2017-10-17 DIAGNOSIS — Z139 Encounter for screening, unspecified: Secondary | ICD-10-CM | POA: Diagnosis not present

## 2017-10-17 DIAGNOSIS — Z9181 History of falling: Secondary | ICD-10-CM | POA: Diagnosis not present

## 2017-10-17 DIAGNOSIS — I499 Cardiac arrhythmia, unspecified: Secondary | ICD-10-CM | POA: Diagnosis not present

## 2017-10-17 DIAGNOSIS — E785 Hyperlipidemia, unspecified: Secondary | ICD-10-CM | POA: Diagnosis not present

## 2017-10-17 DIAGNOSIS — Z0000 Encounter for general adult medical examination without abnormal findings: Secondary | ICD-10-CM | POA: Diagnosis not present

## 2017-10-17 DIAGNOSIS — Z136 Encounter for screening for cardiovascular disorders: Secondary | ICD-10-CM | POA: Diagnosis not present

## 2017-10-17 DIAGNOSIS — Z1331 Encounter for screening for depression: Secondary | ICD-10-CM | POA: Diagnosis not present

## 2017-10-23 DIAGNOSIS — M06871 Other specified rheumatoid arthritis, right ankle and foot: Secondary | ICD-10-CM | POA: Diagnosis not present

## 2017-10-23 DIAGNOSIS — M791 Myalgia, unspecified site: Secondary | ICD-10-CM | POA: Diagnosis not present

## 2017-10-23 DIAGNOSIS — M79643 Pain in unspecified hand: Secondary | ICD-10-CM | POA: Diagnosis not present

## 2017-10-23 DIAGNOSIS — M47816 Spondylosis without myelopathy or radiculopathy, lumbar region: Secondary | ICD-10-CM | POA: Diagnosis not present

## 2017-10-23 DIAGNOSIS — M549 Dorsalgia, unspecified: Secondary | ICD-10-CM | POA: Diagnosis not present

## 2017-10-23 DIAGNOSIS — M0589 Other rheumatoid arthritis with rheumatoid factor of multiple sites: Secondary | ICD-10-CM | POA: Diagnosis not present

## 2017-10-23 DIAGNOSIS — M79642 Pain in left hand: Secondary | ICD-10-CM | POA: Diagnosis not present

## 2017-10-23 DIAGNOSIS — M25559 Pain in unspecified hip: Secondary | ICD-10-CM | POA: Diagnosis not present

## 2017-10-23 DIAGNOSIS — M5135 Other intervertebral disc degeneration, thoracolumbar region: Secondary | ICD-10-CM | POA: Diagnosis not present

## 2017-10-23 DIAGNOSIS — Z79899 Other long term (current) drug therapy: Secondary | ICD-10-CM | POA: Diagnosis not present

## 2017-10-23 DIAGNOSIS — M79641 Pain in right hand: Secondary | ICD-10-CM | POA: Diagnosis not present

## 2017-10-23 DIAGNOSIS — M19042 Primary osteoarthritis, left hand: Secondary | ICD-10-CM | POA: Diagnosis not present

## 2017-10-23 DIAGNOSIS — M19041 Primary osteoarthritis, right hand: Secondary | ICD-10-CM | POA: Diagnosis not present

## 2017-10-23 DIAGNOSIS — M810 Age-related osteoporosis without current pathological fracture: Secondary | ICD-10-CM | POA: Diagnosis not present

## 2017-10-23 DIAGNOSIS — E559 Vitamin D deficiency, unspecified: Secondary | ICD-10-CM | POA: Diagnosis not present

## 2017-11-25 DIAGNOSIS — M1990 Unspecified osteoarthritis, unspecified site: Secondary | ICD-10-CM | POA: Diagnosis not present

## 2017-11-25 DIAGNOSIS — M8088XA Other osteoporosis with current pathological fracture, vertebra(e), initial encounter for fracture: Secondary | ICD-10-CM | POA: Diagnosis not present

## 2017-11-25 DIAGNOSIS — M0589 Other rheumatoid arthritis with rheumatoid factor of multiple sites: Secondary | ICD-10-CM | POA: Diagnosis not present

## 2017-11-25 DIAGNOSIS — Z79899 Other long term (current) drug therapy: Secondary | ICD-10-CM | POA: Diagnosis not present

## 2017-11-25 DIAGNOSIS — Z23 Encounter for immunization: Secondary | ICD-10-CM | POA: Diagnosis not present

## 2017-11-25 DIAGNOSIS — M79643 Pain in unspecified hand: Secondary | ICD-10-CM | POA: Diagnosis not present

## 2017-11-25 DIAGNOSIS — M549 Dorsalgia, unspecified: Secondary | ICD-10-CM | POA: Diagnosis not present

## 2017-11-25 DIAGNOSIS — M810 Age-related osteoporosis without current pathological fracture: Secondary | ICD-10-CM | POA: Diagnosis not present

## 2018-01-31 DIAGNOSIS — I10 Essential (primary) hypertension: Secondary | ICD-10-CM | POA: Diagnosis not present

## 2018-01-31 DIAGNOSIS — Z720 Tobacco use: Secondary | ICD-10-CM | POA: Diagnosis not present

## 2018-01-31 DIAGNOSIS — M25551 Pain in right hip: Secondary | ICD-10-CM | POA: Diagnosis not present

## 2018-01-31 DIAGNOSIS — F419 Anxiety disorder, unspecified: Secondary | ICD-10-CM | POA: Diagnosis not present

## 2018-02-24 DIAGNOSIS — M1990 Unspecified osteoarthritis, unspecified site: Secondary | ICD-10-CM | POA: Diagnosis not present

## 2018-02-24 DIAGNOSIS — Z8781 Personal history of (healed) traumatic fracture: Secondary | ICD-10-CM | POA: Diagnosis not present

## 2018-02-24 DIAGNOSIS — M810 Age-related osteoporosis without current pathological fracture: Secondary | ICD-10-CM | POA: Diagnosis not present

## 2018-02-24 DIAGNOSIS — M549 Dorsalgia, unspecified: Secondary | ICD-10-CM | POA: Diagnosis not present

## 2018-02-24 DIAGNOSIS — Z79899 Other long term (current) drug therapy: Secondary | ICD-10-CM | POA: Diagnosis not present

## 2018-02-24 DIAGNOSIS — M0589 Other rheumatoid arthritis with rheumatoid factor of multiple sites: Secondary | ICD-10-CM | POA: Diagnosis not present

## 2018-02-24 DIAGNOSIS — M79643 Pain in unspecified hand: Secondary | ICD-10-CM | POA: Diagnosis not present

## 2018-03-26 DIAGNOSIS — M810 Age-related osteoporosis without current pathological fracture: Secondary | ICD-10-CM | POA: Diagnosis not present

## 2018-07-24 DIAGNOSIS — M0589 Other rheumatoid arthritis with rheumatoid factor of multiple sites: Secondary | ICD-10-CM | POA: Diagnosis not present

## 2018-07-24 DIAGNOSIS — M1990 Unspecified osteoarthritis, unspecified site: Secondary | ICD-10-CM | POA: Diagnosis not present

## 2018-07-24 DIAGNOSIS — M79643 Pain in unspecified hand: Secondary | ICD-10-CM | POA: Diagnosis not present

## 2018-07-24 DIAGNOSIS — M810 Age-related osteoporosis without current pathological fracture: Secondary | ICD-10-CM | POA: Diagnosis not present

## 2018-07-24 DIAGNOSIS — Z8781 Personal history of (healed) traumatic fracture: Secondary | ICD-10-CM | POA: Diagnosis not present

## 2018-07-24 DIAGNOSIS — M549 Dorsalgia, unspecified: Secondary | ICD-10-CM | POA: Diagnosis not present

## 2018-07-24 DIAGNOSIS — Z79899 Other long term (current) drug therapy: Secondary | ICD-10-CM | POA: Diagnosis not present

## 2018-07-29 DIAGNOSIS — M0589 Other rheumatoid arthritis with rheumatoid factor of multiple sites: Secondary | ICD-10-CM | POA: Diagnosis not present

## 2018-07-31 DIAGNOSIS — Z6821 Body mass index (BMI) 21.0-21.9, adult: Secondary | ICD-10-CM | POA: Diagnosis not present

## 2018-07-31 DIAGNOSIS — M069 Rheumatoid arthritis, unspecified: Secondary | ICD-10-CM | POA: Diagnosis not present

## 2018-07-31 DIAGNOSIS — G4700 Insomnia, unspecified: Secondary | ICD-10-CM | POA: Diagnosis not present

## 2018-07-31 DIAGNOSIS — R1084 Generalized abdominal pain: Secondary | ICD-10-CM | POA: Diagnosis not present

## 2018-10-01 DIAGNOSIS — M810 Age-related osteoporosis without current pathological fracture: Secondary | ICD-10-CM | POA: Diagnosis not present

## 2018-10-27 DIAGNOSIS — M79643 Pain in unspecified hand: Secondary | ICD-10-CM | POA: Diagnosis not present

## 2018-10-27 DIAGNOSIS — M1990 Unspecified osteoarthritis, unspecified site: Secondary | ICD-10-CM | POA: Diagnosis not present

## 2018-10-27 DIAGNOSIS — M810 Age-related osteoporosis without current pathological fracture: Secondary | ICD-10-CM | POA: Diagnosis not present

## 2018-10-27 DIAGNOSIS — Z79899 Other long term (current) drug therapy: Secondary | ICD-10-CM | POA: Diagnosis not present

## 2018-10-27 DIAGNOSIS — M0589 Other rheumatoid arthritis with rheumatoid factor of multiple sites: Secondary | ICD-10-CM | POA: Diagnosis not present

## 2018-10-27 DIAGNOSIS — M549 Dorsalgia, unspecified: Secondary | ICD-10-CM | POA: Diagnosis not present

## 2018-11-18 DIAGNOSIS — Z23 Encounter for immunization: Secondary | ICD-10-CM | POA: Diagnosis not present

## 2019-01-27 DIAGNOSIS — Z79899 Other long term (current) drug therapy: Secondary | ICD-10-CM | POA: Diagnosis not present

## 2019-01-27 DIAGNOSIS — M0589 Other rheumatoid arthritis with rheumatoid factor of multiple sites: Secondary | ICD-10-CM | POA: Diagnosis not present

## 2019-01-27 DIAGNOSIS — M810 Age-related osteoporosis without current pathological fracture: Secondary | ICD-10-CM | POA: Diagnosis not present

## 2019-01-27 DIAGNOSIS — M549 Dorsalgia, unspecified: Secondary | ICD-10-CM | POA: Diagnosis not present

## 2019-01-27 DIAGNOSIS — M1990 Unspecified osteoarthritis, unspecified site: Secondary | ICD-10-CM | POA: Diagnosis not present

## 2019-01-27 DIAGNOSIS — M79643 Pain in unspecified hand: Secondary | ICD-10-CM | POA: Diagnosis not present

## 2019-02-03 DIAGNOSIS — Z6820 Body mass index (BMI) 20.0-20.9, adult: Secondary | ICD-10-CM | POA: Diagnosis not present

## 2019-02-03 DIAGNOSIS — M069 Rheumatoid arthritis, unspecified: Secondary | ICD-10-CM | POA: Diagnosis not present

## 2019-02-03 DIAGNOSIS — Z9181 History of falling: Secondary | ICD-10-CM | POA: Diagnosis not present

## 2019-02-03 DIAGNOSIS — G4700 Insomnia, unspecified: Secondary | ICD-10-CM | POA: Diagnosis not present

## 2019-02-03 DIAGNOSIS — Z1331 Encounter for screening for depression: Secondary | ICD-10-CM | POA: Diagnosis not present

## 2019-02-03 DIAGNOSIS — Z139 Encounter for screening, unspecified: Secondary | ICD-10-CM | POA: Diagnosis not present

## 2019-02-03 DIAGNOSIS — R1084 Generalized abdominal pain: Secondary | ICD-10-CM | POA: Diagnosis not present

## 2019-04-09 DIAGNOSIS — M810 Age-related osteoporosis without current pathological fracture: Secondary | ICD-10-CM | POA: Diagnosis not present

## 2019-04-17 DIAGNOSIS — R109 Unspecified abdominal pain: Secondary | ICD-10-CM | POA: Diagnosis not present

## 2019-04-27 ENCOUNTER — Other Ambulatory Visit: Payer: Self-pay | Admitting: Gastroenterology

## 2019-04-27 ENCOUNTER — Ambulatory Visit
Admission: RE | Admit: 2019-04-27 | Discharge: 2019-04-27 | Disposition: A | Discharge status: Home / Self Care | Payer: Medicare Other | Source: Ambulatory Visit | Attending: Gastroenterology | Admitting: Gastroenterology

## 2019-04-27 DIAGNOSIS — R1084 Generalized abdominal pain: Secondary | ICD-10-CM

## 2019-04-27 DIAGNOSIS — K5900 Constipation, unspecified: Secondary | ICD-10-CM | POA: Diagnosis not present

## 2019-04-27 DIAGNOSIS — R109 Unspecified abdominal pain: Secondary | ICD-10-CM | POA: Diagnosis not present

## 2019-04-29 DIAGNOSIS — Z79899 Other long term (current) drug therapy: Secondary | ICD-10-CM | POA: Diagnosis not present

## 2019-04-29 DIAGNOSIS — M810 Age-related osteoporosis without current pathological fracture: Secondary | ICD-10-CM | POA: Diagnosis not present

## 2019-04-29 DIAGNOSIS — M79643 Pain in unspecified hand: Secondary | ICD-10-CM | POA: Diagnosis not present

## 2019-04-29 DIAGNOSIS — M549 Dorsalgia, unspecified: Secondary | ICD-10-CM | POA: Diagnosis not present

## 2019-04-29 DIAGNOSIS — M1990 Unspecified osteoarthritis, unspecified site: Secondary | ICD-10-CM | POA: Diagnosis not present

## 2019-04-29 DIAGNOSIS — M0589 Other rheumatoid arthritis with rheumatoid factor of multiple sites: Secondary | ICD-10-CM | POA: Diagnosis not present

## 2019-05-04 ENCOUNTER — Other Ambulatory Visit: Payer: Self-pay | Admitting: Gastroenterology

## 2019-05-04 DIAGNOSIS — R64 Cachexia: Secondary | ICD-10-CM

## 2019-05-04 DIAGNOSIS — R109 Unspecified abdominal pain: Secondary | ICD-10-CM

## 2019-05-12 ENCOUNTER — Other Ambulatory Visit: Payer: Medicare Other

## 2019-05-15 ENCOUNTER — Other Ambulatory Visit: Payer: Self-pay

## 2019-05-15 ENCOUNTER — Ambulatory Visit
Admission: RE | Admit: 2019-05-15 | Discharge: 2019-05-15 | Disposition: A | Discharge status: Home / Self Care | Payer: Medicare Other | Source: Ambulatory Visit | Attending: Gastroenterology | Admitting: Gastroenterology

## 2019-05-15 DIAGNOSIS — R109 Unspecified abdominal pain: Secondary | ICD-10-CM

## 2019-05-15 DIAGNOSIS — R64 Cachexia: Secondary | ICD-10-CM

## 2019-05-15 MED ORDER — IOPAMIDOL (ISOVUE-300) INJECTION 61%
80.0000 mL | Freq: Once | INTRAVENOUS | Status: AC | PRN
  Administered 2019-05-15: 80 mL via INTRAVENOUS

## 2019-07-21 DIAGNOSIS — K5900 Constipation, unspecified: Secondary | ICD-10-CM | POA: Diagnosis not present

## 2019-07-21 DIAGNOSIS — R109 Unspecified abdominal pain: Secondary | ICD-10-CM | POA: Diagnosis not present

## 2019-08-03 DIAGNOSIS — R1084 Generalized abdominal pain: Secondary | ICD-10-CM | POA: Diagnosis not present

## 2019-08-03 DIAGNOSIS — S22009A Unspecified fracture of unspecified thoracic vertebra, initial encounter for closed fracture: Secondary | ICD-10-CM | POA: Diagnosis not present

## 2019-08-03 DIAGNOSIS — G4700 Insomnia, unspecified: Secondary | ICD-10-CM | POA: Diagnosis not present

## 2019-08-03 DIAGNOSIS — M069 Rheumatoid arthritis, unspecified: Secondary | ICD-10-CM | POA: Diagnosis not present

## 2019-08-19 DIAGNOSIS — R5383 Other fatigue: Secondary | ICD-10-CM | POA: Diagnosis not present

## 2019-08-19 DIAGNOSIS — M069 Rheumatoid arthritis, unspecified: Secondary | ICD-10-CM | POA: Diagnosis not present

## 2019-08-19 DIAGNOSIS — R1084 Generalized abdominal pain: Secondary | ICD-10-CM | POA: Diagnosis not present

## 2019-08-19 DIAGNOSIS — Z681 Body mass index (BMI) 19 or less, adult: Secondary | ICD-10-CM | POA: Diagnosis not present

## 2019-09-03 DIAGNOSIS — M810 Age-related osteoporosis without current pathological fracture: Secondary | ICD-10-CM | POA: Diagnosis not present

## 2019-09-03 DIAGNOSIS — Z681 Body mass index (BMI) 19 or less, adult: Secondary | ICD-10-CM | POA: Diagnosis not present

## 2019-09-03 DIAGNOSIS — R1084 Generalized abdominal pain: Secondary | ICD-10-CM | POA: Diagnosis not present

## 2019-09-03 DIAGNOSIS — M069 Rheumatoid arthritis, unspecified: Secondary | ICD-10-CM | POA: Diagnosis not present

## 2019-10-07 DIAGNOSIS — R1084 Generalized abdominal pain: Secondary | ICD-10-CM | POA: Diagnosis not present

## 2019-10-07 DIAGNOSIS — M810 Age-related osteoporosis without current pathological fracture: Secondary | ICD-10-CM | POA: Diagnosis not present

## 2019-10-07 DIAGNOSIS — M069 Rheumatoid arthritis, unspecified: Secondary | ICD-10-CM | POA: Diagnosis not present

## 2019-10-07 DIAGNOSIS — Z681 Body mass index (BMI) 19 or less, adult: Secondary | ICD-10-CM | POA: Diagnosis not present

## 2020-01-22 ENCOUNTER — Other Ambulatory Visit: Payer: Self-pay

## 2020-01-22 ENCOUNTER — Inpatient Hospital Stay (HOSPITAL_COMMUNITY)
Admission: EM | Admit: 2020-01-22 | Discharge: 2020-01-24 | DRG: 536 | Disposition: A | Discharge status: Home Health | Payer: Medicare Other | Attending: Internal Medicine | Admitting: Internal Medicine

## 2020-01-22 ENCOUNTER — Emergency Department (HOSPITAL_COMMUNITY): Payer: Medicare Other

## 2020-01-22 ENCOUNTER — Encounter (HOSPITAL_COMMUNITY): Payer: Self-pay | Admitting: *Deleted

## 2020-01-22 DIAGNOSIS — K219 Gastro-esophageal reflux disease without esophagitis: Secondary | ICD-10-CM | POA: Diagnosis present

## 2020-01-22 DIAGNOSIS — S329XXA Fracture of unspecified parts of lumbosacral spine and pelvis, initial encounter for closed fracture: Secondary | ICD-10-CM | POA: Diagnosis present

## 2020-01-22 DIAGNOSIS — M069 Rheumatoid arthritis, unspecified: Secondary | ICD-10-CM | POA: Diagnosis present

## 2020-01-22 DIAGNOSIS — I480 Paroxysmal atrial fibrillation: Secondary | ICD-10-CM | POA: Diagnosis present

## 2020-01-22 DIAGNOSIS — F0390 Unspecified dementia without behavioral disturbance: Secondary | ICD-10-CM | POA: Diagnosis present

## 2020-01-22 DIAGNOSIS — R079 Chest pain, unspecified: Secondary | ICD-10-CM | POA: Diagnosis not present

## 2020-01-22 DIAGNOSIS — I4891 Unspecified atrial fibrillation: Secondary | ICD-10-CM

## 2020-01-22 DIAGNOSIS — S32592A Other specified fracture of left pubis, initial encounter for closed fracture: Secondary | ICD-10-CM | POA: Diagnosis not present

## 2020-01-22 DIAGNOSIS — R0902 Hypoxemia: Secondary | ICD-10-CM | POA: Diagnosis not present

## 2020-01-22 DIAGNOSIS — R0789 Other chest pain: Secondary | ICD-10-CM | POA: Diagnosis not present

## 2020-01-22 DIAGNOSIS — Z66 Do not resuscitate: Secondary | ICD-10-CM | POA: Diagnosis present

## 2020-01-22 DIAGNOSIS — Z85528 Personal history of other malignant neoplasm of kidney: Secondary | ICD-10-CM | POA: Diagnosis not present

## 2020-01-22 DIAGNOSIS — I10 Essential (primary) hypertension: Secondary | ICD-10-CM | POA: Diagnosis present

## 2020-01-22 DIAGNOSIS — M12812 Other specific arthropathies, not elsewhere classified, left shoulder: Secondary | ICD-10-CM | POA: Diagnosis not present

## 2020-01-22 DIAGNOSIS — M25512 Pain in left shoulder: Secondary | ICD-10-CM | POA: Diagnosis not present

## 2020-01-22 DIAGNOSIS — S72009A Fracture of unspecified part of neck of unspecified femur, initial encounter for closed fracture: Secondary | ICD-10-CM | POA: Diagnosis present

## 2020-01-22 DIAGNOSIS — S32512A Fracture of superior rim of left pubis, initial encounter for closed fracture: Secondary | ICD-10-CM | POA: Diagnosis present

## 2020-01-22 DIAGNOSIS — Z7952 Long term (current) use of systemic steroids: Secondary | ICD-10-CM

## 2020-01-22 DIAGNOSIS — Y92007 Garden or yard of unspecified non-institutional (private) residence as the place of occurrence of the external cause: Secondary | ICD-10-CM | POA: Diagnosis not present

## 2020-01-22 DIAGNOSIS — W1839XA Other fall on same level, initial encounter: Secondary | ICD-10-CM | POA: Diagnosis present

## 2020-01-22 DIAGNOSIS — Z79899 Other long term (current) drug therapy: Secondary | ICD-10-CM | POA: Diagnosis not present

## 2020-01-22 DIAGNOSIS — R52 Pain, unspecified: Secondary | ICD-10-CM | POA: Diagnosis not present

## 2020-01-22 DIAGNOSIS — M1612 Unilateral primary osteoarthritis, left hip: Secondary | ICD-10-CM | POA: Diagnosis not present

## 2020-01-22 DIAGNOSIS — W19XXXA Unspecified fall, initial encounter: Secondary | ICD-10-CM | POA: Insufficient documentation

## 2020-01-22 DIAGNOSIS — S0990XA Unspecified injury of head, initial encounter: Secondary | ICD-10-CM | POA: Diagnosis not present

## 2020-01-22 DIAGNOSIS — Z85840 Personal history of malignant neoplasm of eye: Secondary | ICD-10-CM

## 2020-01-22 DIAGNOSIS — R531 Weakness: Secondary | ICD-10-CM | POA: Diagnosis not present

## 2020-01-22 DIAGNOSIS — M25552 Pain in left hip: Secondary | ICD-10-CM | POA: Diagnosis not present

## 2020-01-22 DIAGNOSIS — I248 Other forms of acute ischemic heart disease: Secondary | ICD-10-CM | POA: Diagnosis present

## 2020-01-22 DIAGNOSIS — I361 Nonrheumatic tricuspid (valve) insufficiency: Secondary | ICD-10-CM | POA: Diagnosis not present

## 2020-01-22 DIAGNOSIS — I471 Supraventricular tachycardia: Secondary | ICD-10-CM | POA: Diagnosis present

## 2020-01-22 DIAGNOSIS — Z7401 Bed confinement status: Secondary | ICD-10-CM | POA: Diagnosis not present

## 2020-01-22 DIAGNOSIS — Z20822 Contact with and (suspected) exposure to covid-19: Secondary | ICD-10-CM | POA: Diagnosis present

## 2020-01-22 DIAGNOSIS — I6381 Other cerebral infarction due to occlusion or stenosis of small artery: Secondary | ICD-10-CM | POA: Diagnosis not present

## 2020-01-22 DIAGNOSIS — G319 Degenerative disease of nervous system, unspecified: Secondary | ICD-10-CM | POA: Diagnosis not present

## 2020-01-22 DIAGNOSIS — R102 Pelvic and perineal pain: Secondary | ICD-10-CM | POA: Diagnosis not present

## 2020-01-22 DIAGNOSIS — I499 Cardiac arrhythmia, unspecified: Secondary | ICD-10-CM | POA: Diagnosis not present

## 2020-01-22 DIAGNOSIS — Z905 Acquired absence of kidney: Secondary | ICD-10-CM | POA: Diagnosis not present

## 2020-01-22 DIAGNOSIS — S199XXA Unspecified injury of neck, initial encounter: Secondary | ICD-10-CM | POA: Diagnosis not present

## 2020-01-22 DIAGNOSIS — M2550 Pain in unspecified joint: Secondary | ICD-10-CM | POA: Diagnosis not present

## 2020-01-22 HISTORY — DX: Unspecified dementia, unspecified severity, without behavioral disturbance, psychotic disturbance, mood disturbance, and anxiety: F03.90

## 2020-01-22 LAB — COMPREHENSIVE METABOLIC PANEL
ALT: 18 U/L (ref 0–44)
AST: 24 U/L (ref 15–41)
Albumin: 2.7 g/dL — ABNORMAL LOW (ref 3.5–5.0)
Alkaline Phosphatase: 48 U/L (ref 38–126)
Anion gap: 13 (ref 5–15)
BUN: 13 mg/dL (ref 8–23)
CO2: 22 mmol/L (ref 22–32)
Calcium: 9 mg/dL (ref 8.9–10.3)
Chloride: 108 mmol/L (ref 98–111)
Creatinine, Ser: 1.01 mg/dL — ABNORMAL HIGH (ref 0.44–1.00)
GFR, Estimated: 53 mL/min — ABNORMAL LOW (ref >60)
Glucose, Bld: 98 mg/dL (ref 70–99)
Potassium: 3.1 mmol/L — ABNORMAL LOW (ref 3.5–5.1)
Sodium: 143 mmol/L (ref 135–145)
Total Bilirubin: 1.2 mg/dL (ref 0.3–1.2)
Total Protein: 5.7 g/dL — ABNORMAL LOW (ref 6.5–8.1)

## 2020-01-22 LAB — CBC WITH DIFFERENTIAL/PLATELET
Abs Immature Granulocytes: 0.12 10*3/uL — ABNORMAL HIGH (ref 0.00–0.07)
Basophils Absolute: 0 10*3/uL (ref 0.0–0.1)
Basophils Relative: 0 %
Eosinophils Absolute: 0 10*3/uL (ref 0.0–0.5)
Eosinophils Relative: 0 %
HCT: 38.8 % (ref 36.0–46.0)
Hemoglobin: 12.1 g/dL (ref 12.0–15.0)
Immature Granulocytes: 1 %
Lymphocytes Relative: 10 %
Lymphs Abs: 1.1 10*3/uL (ref 0.7–4.0)
MCH: 32.6 pg (ref 26.0–34.0)
MCHC: 31.2 g/dL (ref 30.0–36.0)
MCV: 104.6 fL — ABNORMAL HIGH (ref 80.0–100.0)
Monocytes Absolute: 0.7 10*3/uL (ref 0.1–1.0)
Monocytes Relative: 6 %
Neutro Abs: 9.1 10*3/uL — ABNORMAL HIGH (ref 1.7–7.7)
Neutrophils Relative %: 83 %
Platelets: 149 10*3/uL — ABNORMAL LOW (ref 150–400)
RBC: 3.71 MIL/uL — ABNORMAL LOW (ref 3.87–5.11)
RDW: 14.3 % (ref 11.5–15.5)
WBC: 11 10*3/uL — ABNORMAL HIGH (ref 4.0–10.5)
nRBC: 0 % (ref 0.0–0.2)

## 2020-01-22 LAB — TROPONIN I (HIGH SENSITIVITY)
Troponin I (High Sensitivity): 231 ng/L (ref <18)
Troponin I (High Sensitivity): 188 ng/L (ref <18)

## 2020-01-22 LAB — CK: Total CK: 167 U/L (ref 38–234)

## 2020-01-22 LAB — LACTIC ACID, PLASMA
Lactic Acid, Venous: 1.9 mmol/L (ref 0.5–1.9)
Lactic Acid, Venous: 1.5 mmol/L (ref 0.5–1.9)

## 2020-01-22 LAB — RESPIRATORY PANEL BY RT PCR (FLU A&B, COVID)
Influenza A by PCR: NEGATIVE
Influenza B by PCR: NEGATIVE
SARS Coronavirus 2 by RT PCR: NEGATIVE

## 2020-01-22 LAB — PROTIME-INR
INR: 0.9 (ref 0.8–1.2)
Prothrombin Time: 12.1 seconds (ref 11.4–15.2)

## 2020-01-22 LAB — TYPE AND SCREEN
ABO/RH(D): O POS
Antibody Screen: NEGATIVE

## 2020-01-22 LAB — MAGNESIUM: Magnesium: 1.7 mg/dL (ref 1.7–2.4)

## 2020-01-22 LAB — PHOSPHORUS: Phosphorus: 2.4 mg/dL — ABNORMAL LOW (ref 2.5–4.6)

## 2020-01-22 MED ORDER — TRAZODONE HCL 150 MG PO TABS
300.0000 mg | ORAL_TABLET | Freq: Every day | ORAL | Status: DC
  Administered 2020-01-22 – 2020-01-23 (×2): 300 mg via ORAL
  Filled 2020-01-22 (×2): qty 2

## 2020-01-22 MED ORDER — METHYLPREDNISOLONE 4 MG PO TABS
4.0000 mg | ORAL_TABLET | Freq: Every day | ORAL | Status: DC
  Administered 2020-01-23 – 2020-01-24 (×2): 4 mg via ORAL
  Filled 2020-01-22 (×2): qty 1

## 2020-01-22 MED ORDER — METOPROLOL TARTRATE 5 MG/5ML IV SOLN
2.5000 mg | INTRAVENOUS | Status: DC | PRN
  Administered 2020-01-23: 2.5 mg via INTRAVENOUS
  Filled 2020-01-22: qty 5

## 2020-01-22 MED ORDER — SULFASALAZINE 500 MG PO TBEC
1000.0000 mg | DELAYED_RELEASE_TABLET | Freq: Two times a day (BID) | ORAL | Status: DC
  Administered 2020-01-22 – 2020-01-24 (×4): 1000 mg via ORAL
  Filled 2020-01-22 (×6): qty 2

## 2020-01-22 MED ORDER — ENOXAPARIN SODIUM 30 MG/0.3ML ~~LOC~~ SOLN
30.0000 mg | SUBCUTANEOUS | Status: DC
  Administered 2020-01-22 – 2020-01-23 (×2): 30 mg via SUBCUTANEOUS
  Filled 2020-01-22 (×2): qty 0.3

## 2020-01-22 MED ORDER — SUCRALFATE 1 G PO TABS
1.0000 g | ORAL_TABLET | Freq: Two times a day (BID) | ORAL | Status: DC
  Administered 2020-01-22 – 2020-01-24 (×4): 1 g via ORAL
  Filled 2020-01-22 (×4): qty 1

## 2020-01-22 MED ORDER — HYDROMORPHONE HCL 1 MG/ML IJ SOLN: 0.5000 mg | INTRAMUSCULAR | Status: DC | PRN

## 2020-01-22 MED ORDER — POLYETHYLENE GLYCOL 3350 17 G PO PACK: 17.0000 g | PACK | Freq: Every day | ORAL | Status: DC | PRN

## 2020-01-22 MED ORDER — ACETAMINOPHEN 325 MG PO TABS
650.0000 mg | ORAL_TABLET | Freq: Every day | ORAL | Status: DC
  Administered 2020-01-23 – 2020-01-24 (×2): 650 mg via ORAL
  Filled 2020-01-22 (×3): qty 2

## 2020-01-22 MED ORDER — LEFLUNOMIDE 20 MG PO TABS
10.0000 mg | ORAL_TABLET | Freq: Every day | ORAL | Status: DC
  Administered 2020-01-23 – 2020-01-24 (×2): 10 mg via ORAL
  Filled 2020-01-22 (×2): qty 0.5

## 2020-01-22 MED ORDER — SODIUM CHLORIDE 0.9 % IV BOLUS
500.0000 mL | Freq: Once | INTRAVENOUS | Status: AC
  Administered 2020-01-22: 500 mL via INTRAVENOUS

## 2020-01-22 MED ORDER — POTASSIUM CHLORIDE CRYS ER 20 MEQ PO TBCR
40.0000 meq | EXTENDED_RELEASE_TABLET | ORAL | Status: AC
  Filled 2020-01-22: qty 2

## 2020-01-22 MED ORDER — HYDROCODONE-ACETAMINOPHEN 5-325 MG PO TABS: 1.0000 | ORAL_TABLET | Freq: Four times a day (QID) | ORAL | Status: DC | PRN

## 2020-01-22 MED ORDER — DILTIAZEM HCL-DEXTROSE 125-5 MG/125ML-% IV SOLN (PREMIX)
5.0000 mg/h | INTRAVENOUS | Status: DC
  Administered 2020-01-22: 5 mg/h via INTRAVENOUS
  Filled 2020-01-22: qty 125

## 2020-01-22 NOTE — ED Notes (Signed)
Attempted to call report, RN will call back.

## 2020-01-22 NOTE — ED Triage Notes (Signed)
PT from home.  Hx of dementia.  Lives at home with her husband who also has dementia.    Pt's son was visiting from [place] and went outside at 3 am yesterday to find her laying on the ground.  He assisted her up and put her back in bed.  Pt became increasingly uncomfortable and kept c/o L hip and shoulder pain.   Per EMS, pt unable to bear weight.  LLE is deformed and externally rotated..  Pt was initially in Afib RVR with hr of 180.  Given 6 of adenosine and 10 mg of cardizem, along with 50 mcg of fentanyl.    VS 126/76 bp 132 hr 16 rr 96% on 2L [place] (89% RA) 107 CBG

## 2020-01-22 NOTE — H&P (Signed)
History and Physical    Ashley [name] NTI:[id number] DOB: 1928-05-26 DOA: 01/22/2020  PCP: Practice, [place] (Confirm with patient/family/NH records and if not entered, this has to be entered at [facility] point of entry) Patient coming from: Home  I have personally briefly reviewed patient's old medical records in [place]  Chief Complaint: Hip pain  HPI: Ashley [name] is a 86 y.o. female with medical history significant of chronic paroxysmal A. fib not on anticoagulation, rheumatoid arthritis on modification medications, advanced dementia, GERD, presented with fall and hip pain.  Patient has baseline dementia, most history was provided patient son over the phone.  Son reported patient sustained a fall 2 days ago, which was unwitnessed.  Son reported there was no loss of consciousness.  And the family was able to hold the patient up and patient was able to ambulate.  However, patient has been very reluctant to walk for the last 2 days and today patient tried to stand up, and unable to do it, complaining about severe left hip pain. ED Course: She was found in rapid A. fib, heart rate in the 150s 160s, Cardizem drip started in ED.  Hip x-ray showed mild displaced fracture on the left superior pubic ramus.  Blood work potassium 3.1, troponin 231>186, Mg and Phos pending.  Review of Systems: Unable to perform patient demented   Past Medical History:  Diagnosis Date  . Arthritis    "all over" (08/13/2013)  . Asthma   . 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 Colocutaneous fistula   . Cryptococcal pneumonitis (HCC)    right lower lobe  . Dementia ([place])   . Diverticulitis   . GERD (gastroesophageal reflux disease)   . QQPYPPJK(932.6)    "monthly" (08/13/2013)  . Hyperlipidemia   . Hypertension   . Renal cell carcinoma    Left kidney  . Rheumatoid arthritis(714.0)   . Syncope   . Uterine prolapse     Past Surgical History:  Procedure Laterality Date  . ABDOMINAL  HYSTERECTOMY N/A 03/22/2014   Procedure: HYSTERECTOMY ABDOMINAL;  Surgeon: Doreen Salvage, MD;  Location: [place];  Service: General;  Laterality: N/A;  . BLADDER SUSPENSION    . BRAIN MENINGIOMA EXCISION    . CHOLECYSTECTOMY    . COLONOSCOPY N/A 05/21/2012   Procedure: COLONOSCOPY;  Surgeon: Arta Silence, MD;  Location: [facility];  Service: Endoscopy;  Laterality: N/A;  . COLOSTOMY N/A 08/28/2013   Procedure: COLOSTOMY;  Surgeon: Gwenyth Ober, MD;  Location: [place];  Service: General;  Laterality: N/A;  . COLOSTOMY REVERSAL N/A 03/22/2014   Procedure: REVERSAL OF COLOSTOMY;  Surgeon: Doreen Salvage, MD;  Location: [place];  Service: General;  Laterality: N/A;  . COLOSTOMY REVISION N/A 08/28/2013   Procedure: COLON RESECTION SIGMOID;  Surgeon: Gwenyth Ober, MD;  Location: [employer];  Service: General;  Laterality: N/A;  . EYE SURGERY Left    Choroid melanoma  . LOOP RECORDER IMPLANT     Left Breast  . LUNG REMOVAL, PARTIAL    . NEPHRECTOMY Left    native  . PROCTOSCOPY N/A 03/22/2014   Procedure: RIGID PROCTOSCOPY;  Surgeon: Doreen Salvage, MD;  Location: [place];  Service: General;  Laterality: N/A;  . TONSILLECTOMY       reports that she has never smoked. She has never used smokeless tobacco. She reports that she does not drink alcohol and does not use drugs.  No Known Allergies  Family History  Problem  Relation Age of Onset  . Heart disease Maternal Aunt        Family hx unk did not live with family     Prior to Admission medications   Medication Sig Start Date End Date Taking? Authorizing Provider  acetaminophen (TYLENOL 8 HOUR ARTHRITIS PAIN) 650 MG CR tablet Take 1,300 mg by mouth daily.   Yes [provider]  leflunomide (ARAVA) 10 MG tablet Take 10 mg by mouth daily.   Yes [provider]  methylPREDNISolone (MEDROL) 4 MG tablet Take 4 mg by mouth daily.  01/21/17  Yes [provider]  sucralfate (CARAFATE) 1 g tablet Take 1 g by mouth 2 (two) times daily.   Yes  [provider]  sulfaSALAzine (AZULFIDINE) 500 MG EC tablet Take 1,000 mg by mouth 2 (two) times daily.    Yes [provider]  trazodone (DESYREL) 300 MG tablet Take 300 mg by mouth at bedtime.   Yes [provider]  dextromethorphan-guaiFENesin (MUCINEX DM) 30-600 MG 12hr tablet Take 1 tablet by mouth 2 (two) times daily as needed for cough. Patient not taking: Reported on 01/22/2020 02/06/17   Dessa Phi, DO  feeding supplement, ENSURE COMPLETE, (ENSURE COMPLETE) LIQD Take 237 mLs by mouth 2 (two) times daily between meals. Patient not taking: Reported on 01/22/2020 08/19/13   Dellinger, Bobby Rumpf, PA-C  fluticasone [facility]) 50 MCG/ACT nasal spray Place 2 sprays into both nostrils as needed for allergies or rhinitis. Patient not taking: Reported on 01/22/2020 02/06/17   Dessa Phi, DO  ibuprofen (ADVIL,MOTRIN) 400 MG tablet Take 1 tablet (400 mg total) by mouth 2 (two) times daily as needed for mild pain or moderate pain. Patient not taking: Reported on 01/22/2020 02/06/17   Dessa Phi, DO  polyethylene glycol [facility] / GLYCOLAX) packet Take 17 g by mouth daily. Patient not taking: Reported on 11/17/2014 08/19/13   Fuller Canada, PA-C    Physical Exam: Vitals:   01/22/20 1545 01/22/20 1600 01/22/20 1615 01/22/20 1630  BP: 109/68 112/68 116/69 125/71  Pulse: 72 71 74 70  Resp: (!) 25 20 19  (!) 26  Temp:      TempSrc:      SpO2: 95% 96% 95% 94%  Weight:      Height:        Constitutional: NAD, calm, comfortable Vitals:   01/22/20 1545 01/22/20 1600 01/22/20 1615 01/22/20 1630  BP: 109/68 112/68 116/69 125/71  Pulse: 72 71 74 70  Resp: (!) 25 20 19  (!) 26  Temp:      TempSrc:      SpO2: 95% 96% 95% 94%  Weight:      Height:       Eyes: PERRL, lids and conjunctivae normal ENMT: Mucous membranes are moist. Posterior pharynx clear of any exudate or lesions.Normal dentition.  Neck: normal, supple, no masses, no thyromegaly Respiratory:  clear to auscultation bilaterally, no wheezing, no crackles. Normal respiratory effort. No accessory muscle use.  Cardiovascular: Regular rate and rhythm, no murmurs / rubs / gallops. No extremity edema. 2+ pedal pulses. No carotid bruits.  Abdomen: no tenderness, no masses palpated. No hepatosplenomegaly. Bowel sounds positive.  Musculoskeletal: no clubbing / cyanosis. No joint deformity upper and lower extremities. Good ROM, no contractures. Normal muscle tone.  Left hip tenderness Skin: no rashes, lesions, ulcers. No induration Neurologic: No facial droop, moving all limbs, following simple commands Psychiatric: Oriented to herself, otherwise confused    Labs on Admission: I have personally reviewed following  labs and imaging studies  CBC: Recent Labs  Lab 01/22/20 1347  WBC 11.0*  NEUTROABS 9.1*  HGB 12.1  HCT 38.8  MCV 104.6*  PLT 825*   Basic Metabolic Panel: Recent Labs  Lab 01/22/20 1147 01/22/20 1347  NA 143  --   K 3.1*  --   CL 108  --   CO2 22  --   GLUCOSE 98  --   BUN 13  --   CREATININE 1.01*  --   CALCIUM 9.0  --   MG  --  1.7  PHOS  --  2.4*   GFR: Estimated Creatinine Clearance: 28.3 mL/min (A) (by C-G formula based on SCr of 1.01 mg/dL (H)). Liver Function Tests: Recent Labs  Lab 01/22/20 1147  AST 24  ALT 18  ALKPHOS 48  BILITOT 1.2  PROT 5.7*  ALBUMIN 2.7*   No results for input(s): LIPASE, AMYLASE in the last 168 hours. No results for input(s): AMMONIA in the last 168 hours. Coagulation Profile: Recent Labs  Lab 01/22/20 1147  INR 0.9   Cardiac Enzymes: Recent Labs  Lab 01/22/20 1147  CKTOTAL 167   BNP (last 3 results) No results for input(s): PROBNP in the last 8760 hours. HbA1C: No results for input(s): HGBA1C in the last 72 hours. CBG: No results for input(s): GLUCAP in the last 168 hours. Lipid Profile: No results for input(s): CHOL, HDL, LDLCALC, TRIG, CHOLHDL, LDLDIRECT in the last 72 hours. Thyroid Function  Tests: No results for input(s): TSH, T4TOTAL, FREET4, T3FREE, THYROIDAB in the last 72 hours. Anemia Panel: No results for input(s): VITAMINB12, FOLATE, FERRITIN, TIBC, IRON, RETICCTPCT in the last 72 hours. Urine analysis:    Component Value Date/Time   COLORURINE AMBER (A) 02/03/2017 2246   APPEARANCEUR HAZY (A) 02/03/2017 2246   LABSPEC 1.028 02/03/2017 2246   PHURINE 5.0 02/03/2017 2246   GLUCOSEU NEGATIVE 02/03/2017 2246   HGBUR NEGATIVE 02/03/2017 2246   BILIRUBINUR NEGATIVE 02/03/2017 2246   KETONESUR NEGATIVE 02/03/2017 2246   PROTEINUR 100 (A) 02/03/2017 2246   UROBILINOGEN 0.2 08/28/2013 0020   NITRITE NEGATIVE 02/03/2017 2246   LEUKOCYTESUR MODERATE (A) 02/03/2017 2246    Radiological Exams on Admission: CT Head Wo Contrast  Result Date: 01/22/2020 CLINICAL DATA:  Head trauma, minor. Neck trauma. Additional provided: Patient found down by son, patient reports left hip and shoulder pain. Laying on the ground EXAM: CT HEAD WITHOUT CONTRAST CT CERVICAL SPINE WITHOUT CONTRAST TECHNIQUE: Multidetector CT imaging of the head and cervical spine was performed following the standard protocol without intravenous contrast. Multiplanar CT image reconstructions of the cervical spine were also generated. COMPARISON:  CT head/cervical spine 02/03/2017. FINDINGS: CT HEAD FINDING: Brain: Mild cerebral atrophy. Redemonstrated large region of chronic encephalomalacia within the anterior and lateral left frontal lobe as well as left basal ganglia/subinsular region. Associated ex vacuo dilatation of the left lateral ventricle. There are chronic lacunar infarcts within the bilateral basal ganglia which are new as compared to the prior head CT of 02/03/2017. Background mild ill-defined hypoattenuation within the cerebral white matter is nonspecific, but compatible with chronic small vessel ischemic disease. There is no acute intracranial hemorrhage. No acute demarcated cortical infarct. No extra-axial  fluid collection. No evidence of intracranial mass. No midline shift. Vascular: No hyperdense vessel.  Atherosclerotic calcifications. Skull: Left-sided cranioplasty.  No calvarial fracture. Sinuses/Orbits: Visualized orbits show no acute finding. No significant paranasal sinus disease at the imaged levels. Other: Subtle left frontal scalp soft tissue swelling. CT CERVICAL  SPINE FINDINGS Alignment: Straightening of the expected cervical lordosis. 2 mm C4-C5 grade 1 anterolisthesis. Skull base and vertebrae: The basion-dental and atlanto-dental intervals are maintained.No evidence of acute fracture to the cervical spine. Soft tissues and spinal canal: No prevertebral fluid or swelling. No visible canal hematoma. Disc levels: Cervical spondylosis with multilevel disc space narrowing, disc bulges, posterior disc osteophytes, uncovertebral hypertrophy and facet arthrosis. Disc space narrowing is severe at C5-C6 and C6-C7. A prominent C5-C6 posterior disc osteophyte contributes to apparent mild/moderate spinal canal stenosis. Upper chest: No consolidation within the imaged lung apices. No visible pneumothorax IMPRESSION: CT head: 1. No evidence of acute intracranial abnormality. 2. Mild left frontal scalp soft tissue swelling. 3. Redemonstrated large region of chronic encephalomalacia within the anterior left cerebral hemisphere. 4. Chronic lacunar infarcts within the bilateral basal ganglia are new from the prior head CT of 02/03/2017. 5. Background mild cerebral atrophy and chronic small vessel ischemic disease. CT cervical spine. 1. No evidence of acute fracture to the cervical spine. 2. 2 mm C4-C5 grade 1 anterolisthesis. 3. Degenerative changes as described. Electronically Signed   By: Kellie Simmering DO   On: 01/22/2020 13:18   CT Cervical Spine Wo Contrast  Result Date: 01/22/2020 CLINICAL DATA:  Head trauma, minor. Neck trauma. Additional provided: Patient found down by son, patient reports left hip and shoulder  pain. Laying on the ground EXAM: CT HEAD WITHOUT CONTRAST CT CERVICAL SPINE WITHOUT CONTRAST TECHNIQUE: Multidetector CT imaging of the head and cervical spine was performed following the standard protocol without intravenous contrast. Multiplanar CT image reconstructions of the cervical spine were also generated. COMPARISON:  CT head/cervical spine 02/03/2017. FINDINGS: CT HEAD FINDING: Brain: Mild cerebral atrophy. Redemonstrated large region of chronic encephalomalacia within the anterior and lateral left frontal lobe as well as left basal ganglia/subinsular region. Associated ex vacuo dilatation of the left lateral ventricle. There are chronic lacunar infarcts within the bilateral basal ganglia which are new as compared to the prior head CT of 02/03/2017. Background mild ill-defined hypoattenuation within the cerebral white matter is nonspecific, but compatible with chronic small vessel ischemic disease. There is no acute intracranial hemorrhage. No acute demarcated cortical infarct. No extra-axial fluid collection. No evidence of intracranial mass. No midline shift. Vascular: No hyperdense vessel.  Atherosclerotic calcifications. Skull: Left-sided cranioplasty.  No calvarial fracture. Sinuses/Orbits: Visualized orbits show no acute finding. No significant paranasal sinus disease at the imaged levels. Other: Subtle left frontal scalp soft tissue swelling. CT CERVICAL SPINE FINDINGS Alignment: Straightening of the expected cervical lordosis. 2 mm C4-C5 grade 1 anterolisthesis. Skull base and vertebrae: The basion-dental and atlanto-dental intervals are maintained.No evidence of acute fracture to the cervical spine. Soft tissues and spinal canal: No prevertebral fluid or swelling. No visible canal hematoma. Disc levels: Cervical spondylosis with multilevel disc space narrowing, disc bulges, posterior disc osteophytes, uncovertebral hypertrophy and facet arthrosis. Disc space narrowing is severe at C5-C6 and C6-C7.  A prominent C5-C6 posterior disc osteophyte contributes to apparent mild/moderate spinal canal stenosis. Upper chest: No consolidation within the imaged lung apices. No visible pneumothorax IMPRESSION: CT head: 1. No evidence of acute intracranial abnormality. 2. Mild left frontal scalp soft tissue swelling. 3. Redemonstrated large region of chronic encephalomalacia within the anterior left cerebral hemisphere. 4. Chronic lacunar infarcts within the bilateral basal ganglia are new from the prior head CT of 02/03/2017. 5. Background mild cerebral atrophy and chronic small vessel ischemic disease. CT cervical spine. 1. No evidence of acute fracture to the cervical  spine. 2. 2 mm C4-C5 grade 1 anterolisthesis. 3. Degenerative changes as described. Electronically Signed   By: Kellie Simmering DO   On: 01/22/2020 13:18   DG Chest Port 1 View  Result Date: 01/22/2020 CLINICAL DATA:  Weakness EXAM: PORTABLE CHEST 1 VIEW COMPARISON:  02/04/2017 FINDINGS: No focal consolidation. No pleural effusion or pneumothorax. Heart and mediastinal contours are unremarkable. No acute osseous abnormality. IMPRESSION: No active disease. Electronically Signed   By: Kathreen Devoid   On: 01/22/2020 12:32   DG Shoulder Left Port  Result Date: 01/22/2020 CLINICAL DATA:  Weakness, pain EXAM: LEFT SHOULDER COMPARISON:  None. FINDINGS: Generalized osteopenia. No fracture or dislocation. No aggressive osseous lesion. Mild arthropathy of the acromioclavicular joint. Glenohumeral joint is unremarkable. Soft tissues are unremarkable. IMPRESSION: No acute osseous injury of the left shoulder. Electronically Signed   By: Kathreen Devoid   On: 01/22/2020 12:31   DG Hip Unilat W or Wo Pelvis 2-3 Views Left  Result Date: 01/22/2020 CLINICAL DATA:  Weakness.  Weakness, pain. EXAM: DG HIP (WITH OR WITHOUT PELVIS) 2-3V LEFT COMPARISON:  Radiographs of the left hip 12/29/2009. CT abdomen/pelvis 05/15/2019. FINDINGS: Mildly displaced fracture of the left  superior pubic ramus. No other fracture is identified. No dislocation of the left hip. Degenerative changes of the bilateral femoroacetabular and sacroiliac joints. IMPRESSION: Mildly displaced fracture of the left superior pubic ramus. This finding was not present on the prior CT of 05/15/2019 and may be acute. Degenerative changes of the bilateral femoroacetabular and sacroiliac joints. Electronically Signed   By: Kellie Simmering DO   On: 01/22/2020 12:35    EKG: Independently reviewed.  Return to normal sinus rhythm, borderline PR prolongation.  Assessment/Plan Active Problems:   Pelvic fracture (HCC)   Hip fracture (HCC)  (please populate well all problems here in Problem List. (For example, if patient is on BP meds at home and you resume or decide to hold them, it is a problem that needs to be her. Same for CAD, COPD, HLD and so on)  Acute left-sided pelvic fracture, superior ramus -Pain control, PT OT evaluation -Etiology of fall is no clear, patient has chronic ambulation dysfunction and recently dimentia has progressed with several time elopements recently.  Paroxysmal a-fib -Return to normal sinus rhythm -Not on any rate control medication at home and not clear whether the fall patient sustained 2 days ago related to 1 such episode of rapid A. Fib. -Review patient record in 2018, which showed that patient was not on anticoagulation because of the GI bleeding issue.  And chronic patient already taking steroids. -Recommend outpatient follow-up with cardiologist, patient family agreed.  Advanced dementia -CODE STATUS is DNR, confirmed with patient family -With worsening of confusion, and recent elopement risk however the risk/given patient acute fracture.  Will monitor.  Elevated troponins -No chest pain, EKG is reassuring, pattern of troponin elevation is flat, likely demand ischemia. -Treatment will be controlled heart rate.  Add as needed Lopressor.  DVT prophylaxis: Lovenox Code  Status: DNR Family Communication: Son and husband over the phone Disposition Plan: After a patient came with hip fracture complicated with A. fib Consults called: None Admission status: Telemetry admission   Lequita Halt MD [facility] Pager [phone]  01/22/2020, 5:50 PM

## 2020-01-22 NOTE — ED Notes (Signed)
Called lab to add on cbc
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
## 2020-01-22 NOTE — ED Notes (Signed)
Dr Francia Greaves notified of elevated trop.

## 2020-01-23 ENCOUNTER — Inpatient Hospital Stay (HOSPITAL_COMMUNITY): Payer: Medicare Other

## 2020-01-23 DIAGNOSIS — W19XXXA Unspecified fall, initial encounter: Secondary | ICD-10-CM | POA: Diagnosis not present

## 2020-01-23 DIAGNOSIS — I4891 Unspecified atrial fibrillation: Secondary | ICD-10-CM | POA: Diagnosis not present

## 2020-01-23 DIAGNOSIS — I361 Nonrheumatic tricuspid (valve) insufficiency: Secondary | ICD-10-CM

## 2020-01-23 LAB — GLUCOSE, CAPILLARY
Glucose-Capillary: 86 mg/dL (ref 70–99)
Glucose-Capillary: 191 mg/dL — ABNORMAL HIGH (ref 70–99)
Glucose-Capillary: 111 mg/dL — ABNORMAL HIGH (ref 70–99)

## 2020-01-23 LAB — BASIC METABOLIC PANEL
Anion gap: 11 (ref 5–15)
BUN: 12 mg/dL (ref 8–23)
CO2: 19 mmol/L — ABNORMAL LOW (ref 22–32)
Calcium: 8.4 mg/dL — ABNORMAL LOW (ref 8.9–10.3)
Chloride: 106 mmol/L (ref 98–111)
Creatinine, Ser: 0.9 mg/dL (ref 0.44–1.00)
GFR, Estimated: >60 mL/min (ref >60)
Glucose, Bld: 193 mg/dL — ABNORMAL HIGH (ref 70–99)
Potassium: 3.9 mmol/L (ref 3.5–5.1)
Sodium: 136 mmol/L (ref 135–145)

## 2020-01-23 LAB — ECHOCARDIOGRAM COMPLETE
Area-P 1/2: 4.6 cm2
Height: 65 in
Weight: 1671.97 oz

## 2020-01-23 LAB — MAGNESIUM: Magnesium: 1.5 mg/dL — ABNORMAL LOW (ref 1.7–2.4)

## 2020-01-23 MED ORDER — PERFLUTREN LIPID MICROSPHERE
1.0000 mL | INTRAVENOUS | Status: AC | PRN
  Administered 2020-01-23: 2 mL via INTRAVENOUS
  Filled 2020-01-23: qty 10

## 2020-01-23 MED ORDER — ENSURE ENLIVE PO LIQD
237.0000 mL | ORAL | Status: DC
  Administered 2020-01-23: 237 mL via ORAL

## 2020-01-23 MED ORDER — POTASSIUM CHLORIDE CRYS ER 20 MEQ PO TBCR
40.0000 meq | EXTENDED_RELEASE_TABLET | ORAL | Status: AC
  Administered 2020-01-23 (×2): 40 meq via ORAL
  Filled 2020-01-23 (×2): qty 2

## 2020-01-23 MED ORDER — METOPROLOL TARTRATE 25 MG PO TABS
25.0000 mg | ORAL_TABLET | Freq: Two times a day (BID) | ORAL | Status: DC
  Administered 2020-01-23 – 2020-01-24 (×3): 25 mg via ORAL
  Filled 2020-01-23 (×3): qty 1

## 2020-01-23 MED ORDER — MAGNESIUM SULFATE 2 GM/50ML IV SOLN
2.0000 g | Freq: Once | INTRAVENOUS | Status: AC
  Administered 2020-01-23: 2 g via INTRAVENOUS
  Filled 2020-01-23 (×2): qty 50

## 2020-01-23 NOTE — Evaluation (Signed)
Physical Therapy Evaluation Patient Details Name: Ashley [name] MRN: [id number] DOB: [birth year] Today's Date: 01/23/2020   History of Present Illness  Ashley [name] is a 86 y.o. female with medical history significant of chronic paroxysmal A. fib not on anticoagulation, rheumatoid arthritis on modification medications, advanced dementia, GERD, presented with fall and hip pain. Found to have fx of Lt superior pelvic ramus.  Clinical Impression  Pt admitted with above diagnosis. Demonstrates significant difficulty with mobility requiring max-total assist with bed mobility. Unable to tolerate full standing with rolling walker this date. Limited due to pain, weakness, and cognitive status. Reports she lives at home with husband but was independent prior to fall while trying to walk to mailbox. States a son is in town visiting from out of state. Recommend skilled care for safety and recovery due to significant difficulty with mobility. Pt currently with functional limitations due to the deficits listed below (see PT Problem List). Pt will benefit from skilled PT to increase their independence and safety with mobility to allow discharge to the venue listed below.       Follow Up Recommendations SNF;Supervision/Assistance - 24 hour    Equipment Recommendations  None recommended by PT (TBD next venue of care)    Recommendations for Other Services       Precautions / Restrictions Precautions Precautions: Fall Restrictions Weight Bearing Restrictions: Yes LLE Weight Bearing: Weight bearing as tolerated      Mobility  Bed Mobility Overal bed mobility: Needs Assistance Bed Mobility: Supine to Sit;Sit to Supine     Supine to sit: Max assist;HOB elevated Sit to supine: Max assist   General bed mobility comments: Max assist for trunk and LLE support in and out of bed. Leaning posteriorly and away from hip once upright. Complains of lt hip pain with movement.     Transfers Overall transfer  level: Needs assistance Equipment used: Rolling walker (2 wheeled) Transfers: Sit to/from Stand Sit to Stand: Total assist;From elevated surface         General transfer comment: Attempted sit<>stand x4 from elevated EOB. Initially pt able to rise 75% but then sits again due to pain with WB through LLE. Cues for hand placement, weight shift symmetry and safety. Progressively worse with each attempt. Pt reports confidence with ability to rise however demos poor awareness of deficits.  Ambulation/Gait                Stairs            Wheelchair Mobility    Modified Rankin (Stroke Patients Only)       Balance Overall balance assessment: Needs assistance Sitting-balance support: No upper extremity supported;Feet supported Sitting balance-Leahy Scale: Fair     Standing balance support: Bilateral upper extremity supported Standing balance-Leahy Scale: Zero                               Pertinent Vitals/Pain Pain Assessment: Faces Faces Pain Scale: Hurts even more Pain Location: Lt hip Pain Descriptors / Indicators: Aching;Grimacing Pain Intervention(s): Limited activity within patient's tolerance;Monitored during session;Repositioned    Home Living Family/patient expects to be discharged to:: Unsure Living Arrangements: Spouse/significant other Available Help at Discharge: Family;Available 24 hours/day Type of Home: House Home Access:  (unsure)     Home Layout:  (unsure) Home Equipment:  (unsure) Additional Comments: Pt is poor historian. States she was walking to mailbox when she fell. Lives with husband. 4 children  out of state, son is apparently in town visiting. No family immediately available in room to confirm.    Prior Function Level of Independence: Needs assistance         Comments: Pt states independent however due to cognition unable to confirm.     Hand Dominance        Extremity/Trunk Assessment   Upper Extremity  Assessment Upper Extremity Assessment: Defer to OT evaluation    Lower Extremity Assessment Lower Extremity Assessment: RLE deficits/detail;Generalized weakness RLE Deficits / Details: Pain Rt hip with movement, difficulty following commands for assessment.       Communication   Communication: No difficulties  Cognition Arousal/Alertness: Awake/alert Behavior During Therapy: Flat affect Overall Cognitive Status: No family/caregiver present to determine baseline cognitive functioning (hx of dementia)                                        General Comments General comments (skin integrity, edema, etc.): Tried scooting up EOB however required max assist as well as in bed mobility. poor cognitive function with difficulty following through with tasks despite verbal tactile cues and extra time to proccess.     Exercises General Exercises - Lower Extremity Ankle Circles/Pumps: AROM;Both;10 reps;Seated Long Arc Quad: AAROM;Left;Right;5 reps;Seated Hip Flexion/Marching: AAROM;Left;Right;5 reps;Seated   Assessment/Plan    PT Assessment Patient needs continued PT services  PT Problem List Decreased strength;Decreased range of motion;Decreased activity tolerance;Decreased balance;Decreased mobility;Decreased cognition;Decreased knowledge of use of DME;Decreased safety awareness;Pain       PT Treatment Interventions DME instruction;Gait training;Functional mobility training;Therapeutic activities;Therapeutic exercise;Balance training;Neuromuscular re-education;Cognitive remediation;Patient/family education;Modalities    PT Goals (Current goals can be found in the Care Plan section)  Acute Rehab PT Goals Patient Stated Goal: Find her husband PT Goal Formulation: Patient unable to participate in goal setting Time For Goal Achievement: 02/06/20 Potential to Achieve Goals: Fair    Frequency Min 3X/week   Barriers to discharge Other (comment) may require assistance  greater than family can provide safely    Co-evaluation               AM-PAC PT "6 Clicks" Mobility  Outcome Measure Help needed turning from your back to your side while in a flat bed without using bedrails?: A Lot Help needed moving from lying on your back to sitting on the side of a flat bed without using bedrails?: A Lot Help needed moving to and from a bed to a chair (including a wheelchair)?: Total Help needed standing up from a chair using your arms (e.g., wheelchair or bedside chair)?: Total Help needed to walk in hospital room?: Total Help needed climbing 3-5 steps with a railing? : Total 6 Click Score: 8    End of Session Equipment Utilized During Treatment: Gait belt Activity Tolerance: Patient limited by pain Patient left: in bed;with call bell/phone within reach;with bed alarm set   PT Visit Diagnosis: Difficulty in walking, not elsewhere classified (R26.2);History of falling (Z91.81);Muscle weakness (generalized) (M62.81);Pain Pain - Right/Left: Left Pain - part of body: Hip    Time: 1440-1513 PT Time Calculation (min) (ACUTE ONLY): 33 min   Charges:   PT Evaluation $PT Eval Moderate Complexity: 1 Mod PT Treatments $Therapeutic Activity: 8-22 mins        Elayne Snare, PT, DPT  Ellouise Newer 01/23/2020, 3:24 PM

## 2020-01-23 NOTE — Progress Notes (Addendum)
[facility] Progress Note  Patient: Ashley [name]    ZHG:[id number]  DOA: 01/22/2020     Date of Service: the patient was seen and examined on 01/23/2020  Brief hospital course: Past medical history of paroxysmal A. fib, not on any anticoagulation.  Rheumatoid arthritis, dementia, GERD.  Presents with a fall and pelvic fracture.  Also had significant SVT requiring IV Lopressor.  Also has elevated troponin likely demand ischemia. Currently plan is pain control and heart rate control  Assessment and Plan: 1.  Fall. Acute left-sided pelvic fracture. Continue pain control. PT OT recommended SNF with 24-hour supervision. Family will be able to arrange 24-hour supervision at home. We will arrange home health on discharge. Family has all the equipment but will need bedside commode as well as bedpan.  2.  Paroxysmal A. fib. SVT. Briefly patient is a normal sinus rhythm but also briefly the patient's heart rate goes to 160 with SVT. Responding to IV Lopressor. Not sure whether associated with her fall. Monitor on telemetry. Continue Lopressor.  3.  Elevated troponin. Echocardiogram ordered given elevated troponin. EKG unremarkable.  Patient does not have any chest pain right now but per daughter patient has been reporting chest pain for last 1 month on and off.  4.  Dementia no behavioral issues Monitor.  5.  Hypomagnesemia. Replacing IV.  Body mass index is 17.39 kg/m.  Nutrition Problem: Increased nutrient needs Etiology: acute illness Interventions: Interventions: Ensure Enlive (each supplement provides 350kcal and 20 grams of protein), Magic cup      Diet: Soft diet DVT Prophylaxis:   enoxaparin (LOVENOX) injection 30 mg Start: 01/22/20 1745    Advance goals of care discussion: DNR  Family Communication: family was present at bedside, at the time of interview.   Disposition:  Status is: Inpatient  Remains inpatient appropriate because: Frequent SVT has been  PVC requiring IV Lopressor, pain control requirement as well as further work-up for elevated troponin.   Dispo: The patient is from: Home              Anticipated d/c is to: Home              Anticipated d/c date is: 2 days              Patient currently is not medically stable to d/c.        Subjective: No nausea no vomiting.  No fever no chills.  No chest pain right now but family reported on and off chest pain at home multiple times during last 1 month.  Physical Exam:  General: Appear in mild distress, no Rash; Oral Mucosa Clear, moist. no Abnormal Neck Mass Or lumps, Conjunctiva normal  Cardiovascular: S1 and S2 Present, no Murmur, Respiratory: good respiratory effort, Bilateral Air entry present and CTA, no Crackles, no wheezes Abdomen: Bowel Sound present, Soft and no tenderness Extremities: no Pedal edema Neurology: alert and oriented to place and person affect appropriate. no new focal deficit Gait not checked due to patient safety concerns  Vitals:   01/23/20 0146 01/23/20 0547 01/23/20 0743 01/23/20 1514  BP: (!) 150/73 (!) 171/93 (!) 178/92 123/78  Pulse: 70 81 88 67  Resp: 17 20 19 18   Temp: 98.3 F (36.8 C) 98.1 F (36.7 C) 98 F (36.7 C) 98.4 F (36.9 C)  TempSrc: Oral Oral Oral Oral  SpO2: 96% 98% 95% 98%  Weight:      Height:        Intake/Output Summary (Last  24 hours) at 01/23/2020 1605 Last data filed at 01/23/2020 0945 Gross per 24 hour  Intake 309.23 ml  Output 350 ml  Net -40.77 ml   Filed Weights   01/22/20 1234  Weight: 47.4 kg    Data Reviewed: I have personally reviewed and interpreted daily labs, tele strips, imagings as discussed above. I reviewed all nursing notes, pharmacy notes, vitals, pertinent old records I have discussed plan of care as described above with RN and patient/family.  CBC: Recent Labs  Lab 01/22/20 1347  WBC 11.0*  NEUTROABS 9.1*  HGB 12.1  HCT 38.8  MCV 104.6*  PLT 762*   Basic Metabolic  Panel: Recent Labs  Lab 01/22/20 1147 01/22/20 1347 01/23/20 1242  NA 143  --  136  K 3.1*  --  3.9  CL 108  --  106  CO2 22  --  19*  GLUCOSE 98  --  193*  BUN 13  --  12  CREATININE 1.01*  --  0.90  CALCIUM 9.0  --  8.4*  MG  --  1.7 1.5*  PHOS  --  2.4*  --     Studies: ECHOCARDIOGRAM COMPLETE  Result Date: 01/23/2020    ECHOCARDIOGRAM REPORT   Patient Name:   Ashley [name] Date of Exam: 01/23/2020 Medical Rec #:  [id number]     Height:       65.0 in Accession #:    [id number]    Weight:       104.5 lb Date of Birth:  [birth year]    BSA:          1.501 m Patient Age:    86 years      BP:           178/92 mmHg Patient Gender: F             HR:           88 bpm. Exam Location:  Inpatient Procedure: 2D Echo and Intracardiac Opacification Agent Indications:    Elevated Troponin  History:        Patient has no prior history of Echocardiogram examinations.                 Risk Factors:Hypertension and Dyslipidemia. Advanced dementia,                 GERD, presented with fall and hip pain. Paroxysmal a-fib.  Sonographer:    Darlina Sicilian RDCS Referring Phys: [id number] [facility] M Muaad Boehning  Sonographer Comments: Lopressor given during echocardiogram 2.5mg  IMPRESSIONS  1. Left ventricular ejection fraction, by estimation, is 60 to 65%. The left ventricle has normal function. The left ventricle has no regional wall motion abnormalities. Left ventricular diastolic parameters are consistent with Grade I diastolic dysfunction (impaired relaxation). Elevated left atrial pressure.  2. Right ventricular systolic function is normal. The right ventricular size is normal. There is normal pulmonary artery systolic pressure.  3. Left atrial size was moderately dilated.  4. The mitral valve was not well visualized. No evidence of mitral valve regurgitation. No evidence of mitral stenosis.  5. The aortic valve is tricuspid. Aortic valve regurgitation is not visualized. No aortic stenosis is present. FINDINGS  Left  Ventricle: Left ventricular ejection fraction, by estimation, is 60 to 65%. The left ventricle has normal function. The left ventricle has no regional wall motion abnormalities. Definity contrast agent was given IV to delineate the left ventricular  endocardial borders. The left ventricular internal cavity size was normal  in size. There is no left ventricular hypertrophy. Left ventricular diastolic parameters are consistent with Grade I diastolic dysfunction (impaired relaxation). Elevated left atrial pressure. Right Ventricle: The right ventricular size is normal. No increase in right ventricular wall thickness. Right ventricular systolic function is normal. There is normal pulmonary artery systolic pressure. The tricuspid regurgitant velocity is 2.85 m/s, and  with an assumed right atrial pressure of 3 mmHg, the estimated right ventricular systolic pressure is 02.4 mmHg. Left Atrium: Left atrial size was moderately dilated. Right Atrium: Right atrial size was not well visualized. Pericardium: There is no evidence of pericardial effusion. Mitral Valve: The mitral valve was not well visualized. No evidence of mitral valve regurgitation. No evidence of mitral valve stenosis. Tricuspid Valve: The tricuspid valve is normal in structure. Tricuspid valve regurgitation is mild . No evidence of tricuspid stenosis. Aortic Valve: The aortic valve is tricuspid. Aortic valve regurgitation is not visualized. No aortic stenosis is present. Pulmonic Valve: The pulmonic valve was not well visualized. Pulmonic valve regurgitation is not visualized. No evidence of pulmonic stenosis. Aorta: The aortic root is normal in size and structure. Pulmonary Artery: Indeterminant PASP, IVC poorly visualized. Venous: The inferior vena cava was not well visualized. IAS/Shunts: No atrial level shunt detected by color flow Doppler.  LEFT VENTRICLE PLAX 2D LVOT diam:     1.90 cm     Diastology LV SV:         35          LV e' medial:    2.35 cm/s  LV SV Index:   23          LV E/e' medial:  21.1 LVOT Area:     2.84 cm    LV e' lateral:   2.59 cm/s                            LV E/e' lateral: 19.2  LV Volumes (MOD) LV vol d, MOD A4C: 42.1 ml LV SV MOD A4C:     42.1 ml RIGHT VENTRICLE RV S prime:     9.03 cm/s LEFT ATRIUM             Index       RIGHT ATRIUM           Index LA Vol (A2C):   39.3 ml 26.18 ml/m RA Area:     14.30 cm LA Vol (A4C):   64.7 ml 43.10 ml/m RA Volume:   35.20 ml  23.45 ml/m LA Biplane Vol: 55.2 ml 36.77 ml/m  AORTIC VALVE LVOT Vmax:   90.61 cm/s LVOT Vmean:  60.578 cm/s LVOT VTI:    0.124 m  AORTA Ao Root diam: 3.00 cm MITRAL VALVE               TRICUSPID VALVE MV Area (PHT): 4.60 cm    TR Peak grad:   32.5 mmHg MV Decel Time: 165 msec    TR Vmax:        285.00 cm/s MV E velocity: 49.70 cm/s MV A velocity: 81.00 cm/s  SHUNTS MV E/A ratio:  0.61        Systemic VTI:  0.12 m                            Systemic Diam: 1.90 cm Carlyle Dolly MD Electronically signed by Carlyle Dolly MD Signature Date/Time: 01/23/2020/1:45:16 PM  Final     Scheduled Meds: . acetaminophen  650 mg Oral Daily  . enoxaparin (LOVENOX) injection  30 mg Subcutaneous Q24H  . feeding supplement  237 mL Oral Q24H  . leflunomide  10 mg Oral Daily  . methylPREDNISolone  4 mg Oral Daily  . metoprolol tartrate  25 mg Oral BID  . sucralfate  1 g Oral BID  . sulfaSALAzine  1,000 mg Oral BID  . trazodone  300 mg Oral QHS   Continuous Infusions: PRN Meds: HYDROcodone-acetaminophen, HYDROmorphone (DILAUDID) injection, metoprolol tartrate, polyethylene glycol  Time spent: 35 minutes  Author: Berle Mull, MD [facility] 01/23/2020 4:05 PM  To reach On-call, see care teams to locate the attending and reach out via [email or web address]. Between 7PM-7AM, please contact night-coverage If you still have difficulty reaching the attending provider, please page the [facility] (Director on Call) for [facility] on amion for assistance.

## 2020-01-23 NOTE — Progress Notes (Signed)
Initial Nutrition Assessment  DOCUMENTATION CODES:   Underweight  INTERVENTION:  Ensure Enlive po daily, each supplement provides 350 kcal and 20 grams of protein  Magic cup BID with meals, each supplement provides 290 kcal and 9 grams of protein  Daily weights  NUTRITION DIAGNOSIS:   Increased nutrient needs related to acute illness as evidenced by estimated needs.  GOAL:   Patient will meet greater than or equal to 90% of their needs  MONITOR:   Labs, I & O's, Supplement acceptance, PO intake, Weight trends  REASON FOR ASSESSMENT:   Consult Assessment of nutrition requirement/status  ASSESSMENT:  RD working remotely.  86 year old female with history significant of advanced dementia, paroxysmal Afib, GERD, HTN, HLD, renal cell carcinoma, and RA who presented with left hip pain and limited ambulation for 2 days after sustaining an unwitnessed fall at home. Pt admitted with mild displaced fracture of left superior pubic ramus.  Patient noted oriented to self only, unable to obtain nutrition history. Per flowsheets, she consumed 50% of breakfast tray this morning. Will order Ensure daily as well as Magic Cup with meals to aid with meeting needs.   Weight history reviewed, last wt prior to admission 47.4 kg on 02/04/17. Unable to assess recent trends. Patient is underweight, however unsure of accuracy of admit wt given pelvic fracture as well as advanced dementia, suspect current wt  from prior admission. Given advanced age as well as dementia with highly suspect malnutrition. Will plan to complete exam at follow-up. Will order daily weights to confirm actual weight.   Medications reviewed and include:Methylprednisolone  Labs: CBGs 191,86, Mg 1.5 (L), K 3.9 (WNL)  Per notes: -pain control; PT OT evaluation  -recent progression of dementia with time elopements  -paroxysmal a-fib, now normal sinus rhythum -outpt cardiology follow-up   NUTRITION - FOCUSED PHYSICAL  EXAM: Unable to complete at this time, RD working remotely.  Diet Order:   Diet Order            Diet Heart Room service appropriate? Yes; Fluid consistency: Thin  Diet effective now                 EDUCATION NEEDS:   No education needs have been identified at this time  Skin:  Skin Assessment: Reviewed RN Assessment  Last BM:  pta  Height:   Ht Readings from Last 1 Encounters:  01/22/20 5\' 5"  (1.651 m)    Weight:   Wt Readings from Last 1 Encounters:  01/22/20 47.4 kg   BMI:  Body mass index is 17.39 kg/m.  Estimated Nutritional Needs:   Kcal:  4332-9518  Protein:  70-85  Fluid:  >1.2 L/day   Lajuan Lines, RD, LDN Clinical Nutrition After Hours/Weekend Pager # in [place]

## 2020-01-23 NOTE — Progress Notes (Signed)
  Echocardiogram 2D Echocardiogram with definity has been performed.  Darlina Sicilian M 01/23/2020, 1:25 PM

## 2020-01-24 DIAGNOSIS — W19XXXA Unspecified fall, initial encounter: Secondary | ICD-10-CM | POA: Diagnosis not present

## 2020-01-24 DIAGNOSIS — I4891 Unspecified atrial fibrillation: Secondary | ICD-10-CM | POA: Diagnosis not present

## 2020-01-24 LAB — CBC
HCT: 35 % — ABNORMAL LOW (ref 36.0–46.0)
Hemoglobin: 11.1 g/dL — ABNORMAL LOW (ref 12.0–15.0)
MCH: 32.6 pg (ref 26.0–34.0)
MCHC: 31.7 g/dL (ref 30.0–36.0)
MCV: 102.6 fL — ABNORMAL HIGH (ref 80.0–100.0)
Platelets: 170 10*3/uL (ref 150–400)
RBC: 3.41 MIL/uL — ABNORMAL LOW (ref 3.87–5.11)
RDW: 14.1 % (ref 11.5–15.5)
WBC: 10.2 10*3/uL (ref 4.0–10.5)
nRBC: 0 % (ref 0.0–0.2)

## 2020-01-24 LAB — BASIC METABOLIC PANEL
Anion gap: 8 (ref 5–15)
BUN: 14 mg/dL (ref 8–23)
CO2: 24 mmol/L (ref 22–32)
Calcium: 9.5 mg/dL (ref 8.9–10.3)
Chloride: 108 mmol/L (ref 98–111)
Creatinine, Ser: 0.84 mg/dL (ref 0.44–1.00)
GFR, Estimated: >60 mL/min (ref >60)
Glucose, Bld: 108 mg/dL — ABNORMAL HIGH (ref 70–99)
Potassium: 4.9 mmol/L (ref 3.5–5.1)
Sodium: 140 mmol/L (ref 135–145)

## 2020-01-24 LAB — MAGNESIUM: Magnesium: 2.2 mg/dL (ref 1.7–2.4)

## 2020-01-24 MED ORDER — METOPROLOL TARTRATE 25 MG PO TABS
25.0000 mg | ORAL_TABLET | Freq: Two times a day (BID) | ORAL | 0 refills | Status: DC
Start: 2020-01-24 — End: 2020-03-08

## 2020-01-24 MED ORDER — HYDROCODONE-ACETAMINOPHEN 5-325 MG PO TABS: 1.0000 | ORAL_TABLET | Freq: Three times a day (TID) | ORAL | 0 refills | Status: DC | PRN

## 2020-01-24 NOTE — Progress Notes (Signed)
Judith Blonder to be D/C'd  per MD order. Discussed with the patient daughter, Laqueta Jean, and all questions fully answered.  VSS, Skin clean, dry and intact without evidence of skin break down, no evidence of skin tears noted.  IV catheter discontinued intact. Site without signs and symptoms of complications. Dressing and pressure applied.  An After Visit Summary was printed and given to the patient.   D/c education completed with patient/family including follow up instructions, medication list, d/c activities limitations if indicated, with other d/c instructions as indicated by MD - patient able to verbalize understanding, all questions fully answered.   Patient instructed to return to ED, call 911, or call MD for any changes in condition.   Patient to be escorted via [facility].

## 2020-01-24 NOTE — Plan of Care (Signed)

## 2020-01-24 NOTE — TOC Transition Note (Addendum)
Transition of Care [facility]) - CM/SW Discharge Note   Patient Details  Name: Ashley [name] MRN: [id number] Date of Birth: [birth year]  Transition of Care [facility] CM/SW Contact:  Claudie Leach, RN 01/24/2020, 10:43 AM   Clinical Narrative:    Discussed DC plans with daughter and son.  Patient to d/c to her home, where granddaughter and daughter will provide 24 hour care.  Family requests ambulance transport and 3n1.  Daughter, Lorriane Shire, is here and will transport 3n1 in her car.  [facility] will be called after 3n1 arrives.  [place] form printed.  Son has no preference of [place] agency. Medicare rated list discussed and he is open to any well-rated agency.  Referral accepted by [facility] with Alvis Lemmings.    Final next level of care: [place] Barriers to Discharge: No Barriers Identified   Patient Goals and CMS Choice Patient states their goals for this hospitalization and ongoing recovery are:: to get well at home CMS Medicare.gov Compare Post Acute Care list provided to:: Patient Represenative (must comment) (son) Choice offered to / list presented to : Adult Children   Discharge Plan and Services                DME Arranged: 3-N-1 DME Agency: [employer] Date DME Agency Contacted: 01/24/20 Time DME Agency Contacted: 1032 Representative spoke with at DME Agency: [place]: PT, OT [place] Agency: [place] Date [place]: 01/24/20 Time [place]: [id number] Representative spoke with at [place]: Cory   1110- DME delivered; PT[facility] called for transport.

## 2020-01-25 NOTE — Discharge Summary (Signed)
[facility] Discharge Summary   Patient: Ashley [name] QHU:[id number]  PCP: Practice, [facility]  Date of admission: 01/22/2020   Date of discharge: 01/24/2020     Discharge Diagnoses:  Principal diagnosis Fall leading to pelvic fracture Active Problems:   Pelvic fracture ([place])   Hip fracture ([place])   Admitted From: home Disposition:  Home with home health  Recommendations for Outpatient Follow-up:  1. PCP: follow up in 1 week  2. Follow up LABS/TEST:  none   Follow-up Information    Practice, [facility]. Schedule an appointment as soon as possible for a visit in 1 week(s).   Contact information: [place] [id number] [phone]        Care, [facility] Follow up.   Specialty: [place] Why: This agency will contact you Monday or Tuesday to arrange therapy visits. Contact information: [address] [place] [address] [phone]              Diet recommendation: Cardiac diet  Activity: The patient is advised to gradually reintroduce usual activities, as tolerated  Discharge Condition: stable  Code Status: DNR   History of present illness: As per the H and P dictated on admission, "Ashley [name] is a 86 y.o. female with medical history significant of chronic paroxysmal A. fib not on anticoagulation, rheumatoid arthritis on modification medications, advanced dementia, GERD, presented with fall and hip pain.  Patient has baseline dementia, most history was provided patient son over the phone.  Son reported patient sustained a fall 2 days ago, which was unwitnessed.  Son reported there was no loss of consciousness.  And the family was able to hold the patient up and patient was able to ambulate.  However, patient has been very reluctant to walk for the last 2 days and today patient tried to stand up, and unable to do it, complaining about severe left hip pain. ED Course: She was found  in rapid A. fib, heart rate in the 150s 160s, Cardizem drip started in ED.  Hip x-ray showed mild displaced fracture on the left superior pubic ramus.  Blood work potassium 3.1, troponin 231>186, Mg and Phos pending."  Hospital Course:  Summary of her active problems in the hospital is as following.  1.  Fall. Acute left-sided pelvic fracture. Continue pain control. PT OT recommended SNF with 24-hour supervision. Family will be able to arrange 24-hour supervision at home. We will arrange home health on discharge. Family has all the equipment but will need bedside commode as well as bedpan.  2.  Paroxysmal A. fib. SVT. Patient in and out of NSR and SVT Responding to Lopressor. Not sure whether associated with her fall. Continue Lopressor.  3.  Elevated troponin. Echocardiogram shows normal EF and no WMA.  EKG unremarkable.   Patient does not have any chest pain right now but per daughter patient has been reporting chest pain for last 1 month on and off. Likely due to SVTs.  Outpatient follow up with PCP.   4.  Dementia no behavioral issues Monitor.  5.  Hypomagnesemia. Replaced  6.underweight At risk of poor outcome.  Continue supplements   Body mass index is 18.78 kg/m.  Nutrition Problem: Increased nutrient needs Etiology: acute illness Nutrition Interventions: Interventions: Ensure Enlive (each supplement provides 350kcal and 20 grams of protein), Magic cup      Pain control  - [place] Controlled Substance Reporting System database was reviewed. -  5 day supply was provided. - Patient was instructed, not to drive, operate heavy machinery, perform activities at heights, swimming or participation in water activities or provide baby sitting services while on Pain, Sleep and Anxiety Medications; until her outpatient Physician has advised to do so again.  - Also recommended to not to take more than prescribed Pain, Sleep and Anxiety Medications.  Patient was  seen by physical therapy, who recommended SNF, family preferred to go home, home health was arranged. On the day of the discharge the patient's vitals were stable, and no other acute medical condition were reported by patient. The patient was felt safe to be discharge at Home with Home health.  Consultants: none Procedures: noen  Discharge Exam: General: Appear in no distress, no Rash; Oral Mucosa Clear, moist. no Abnormal Neck Mass Or lumps, Conjunctiva normal  Cardiovascular: S1 and S2 Present, no Murmur Respiratory: good respiratory effort, Bilateral Air entry present and CTA, no Crackles, no wheezes Abdomen: Bowel Sound present, Soft and no tenderness Extremities: no Pedal edema Neurology: alert and oriented to person affect appropriate. no new focal deficit  [facility] Weights   01/22/20 1234 01/24/20 0359  Weight: 47.4 kg 51.2 kg   Vitals:   01/23/20 1919 01/24/20 0358  BP: 139/84 (!) 155/94  Pulse: 81 69  Resp: 18 20  Temp: 98.4 F (36.9 C) 98.4 F (36.9 C)  SpO2: 97% 97%    DISCHARGE MEDICATION: Allergies as of 01/24/2020   No Known Allergies     Medication List    STOP taking these medications   dextromethorphan-guaiFENesin 30-600 MG 12hr tablet Commonly known as: MUCINEX DM   ibuprofen 400 MG tablet Commonly known as: ADVIL     TAKE these medications   feeding supplement (ENSURE COMPLETE) Liqd Take 237 mLs by mouth 2 (two) times daily between meals.   fluticasone 50 MCG/ACT nasal spray Commonly known as: FLONASE Place 2 sprays into both nostrils as needed for allergies or rhinitis.   HYDROcodone-acetaminophen 5-325 MG tablet Commonly known as: NORCO/VICODIN Take 1 tablet by mouth 3 (three) times daily as needed for moderate pain.   leflunomide 10 MG tablet Commonly known as: ARAVA Take 10 mg by mouth daily.   methylPREDNISolone 4 MG tablet Commonly known as: MEDROL Take 4 mg by mouth daily.   metoprolol tartrate 25 MG tablet Commonly known as:  LOPRESSOR Take 1 tablet (25 mg total) by mouth 2 (two) times daily.   polyethylene glycol 17 g packet Commonly known as: MIRALAX / GLYCOLAX Take 17 g by mouth daily.   sucralfate 1 g tablet Commonly known as: CARAFATE Take 1 g by mouth 2 (two) times daily.   sulfaSALAzine 500 MG EC tablet Commonly known as: AZULFIDINE Take 1,000 mg by mouth 2 (two) times daily.   trazodone 300 MG tablet Commonly known as: DESYREL Take 300 mg by mouth at bedtime.   Tylenol 8 Hour Arthritis Pain 650 MG CR tablet Generic drug: acetaminophen Take 1,300 mg by mouth daily.      No Known Allergies Discharge Instructions    Diet general   Complete by: As directed    Increase activity slowly   Complete by: As directed       The results of significant diagnostics from this hospitalization (including imaging, microbiology, ancillary and laboratory) are listed below for reference.    Significant Diagnostic Studies: CT Head Wo Contrast  Result Date: 01/22/2020 CLINICAL DATA:  Head trauma, minor. Neck trauma. Additional provided: Patient found down by son,  patient reports left hip and shoulder pain. Laying on the ground EXAM: CT HEAD WITHOUT CONTRAST CT CERVICAL SPINE WITHOUT CONTRAST TECHNIQUE: Multidetector CT imaging of the head and cervical spine was performed following the standard protocol without intravenous contrast. Multiplanar CT image reconstructions of the cervical spine were also generated. COMPARISON:  CT head/cervical spine 02/03/2017. FINDINGS: CT HEAD FINDING: Brain: Mild cerebral atrophy. Redemonstrated large region of chronic encephalomalacia within the anterior and lateral left frontal lobe as well as left basal ganglia/subinsular region. Associated ex vacuo dilatation of the left lateral ventricle. There are chronic lacunar infarcts within the bilateral basal ganglia which are new as compared to the prior head CT of 02/03/2017. Background mild ill-defined hypoattenuation within the  cerebral white matter is nonspecific, but compatible with chronic small vessel ischemic disease. There is no acute intracranial hemorrhage. No acute demarcated cortical infarct. No extra-axial fluid collection. No evidence of intracranial mass. No midline shift. Vascular: No hyperdense vessel.  Atherosclerotic calcifications. Skull: Left-sided cranioplasty.  No calvarial fracture. Sinuses/Orbits: Visualized orbits show no acute finding. No significant paranasal sinus disease at the imaged levels. Other: Subtle left frontal scalp soft tissue swelling. CT CERVICAL SPINE FINDINGS Alignment: Straightening of the expected cervical lordosis. 2 mm C4-C5 grade 1 anterolisthesis. Skull base and vertebrae: The basion-dental and atlanto-dental intervals are maintained.No evidence of acute fracture to the cervical spine. Soft tissues and spinal canal: No prevertebral fluid or swelling. No visible canal hematoma. Disc levels: Cervical spondylosis with multilevel disc space narrowing, disc bulges, posterior disc osteophytes, uncovertebral hypertrophy and facet arthrosis. Disc space narrowing is severe at C5-C6 and C6-C7. A prominent C5-C6 posterior disc osteophyte contributes to apparent mild/moderate spinal canal stenosis. Upper chest: No consolidation within the imaged lung apices. No visible pneumothorax IMPRESSION: CT head: 1. No evidence of acute intracranial abnormality. 2. Mild left frontal scalp soft tissue swelling. 3. Redemonstrated large region of chronic encephalomalacia within the anterior left cerebral hemisphere. 4. Chronic lacunar infarcts within the bilateral basal ganglia are new from the prior head CT of 02/03/2017. 5. Background mild cerebral atrophy and chronic small vessel ischemic disease. CT cervical spine. 1. No evidence of acute fracture to the cervical spine. 2. 2 mm C4-C5 grade 1 anterolisthesis. 3. Degenerative changes as described. Electronically Signed   By: Kellie Simmering DO   On: 01/22/2020 13:18    CT Cervical Spine Wo Contrast  Result Date: 01/22/2020 CLINICAL DATA:  Head trauma, minor. Neck trauma. Additional provided: Patient found down by son, patient reports left hip and shoulder pain. Laying on the ground EXAM: CT HEAD WITHOUT CONTRAST CT CERVICAL SPINE WITHOUT CONTRAST TECHNIQUE: Multidetector CT imaging of the head and cervical spine was performed following the standard protocol without intravenous contrast. Multiplanar CT image reconstructions of the cervical spine were also generated. COMPARISON:  CT head/cervical spine 02/03/2017. FINDINGS: CT HEAD FINDING: Brain: Mild cerebral atrophy. Redemonstrated large region of chronic encephalomalacia within the anterior and lateral left frontal lobe as well as left basal ganglia/subinsular region. Associated ex vacuo dilatation of the left lateral ventricle. There are chronic lacunar infarcts within the bilateral basal ganglia which are new as compared to the prior head CT of 02/03/2017. Background mild ill-defined hypoattenuation within the cerebral white matter is nonspecific, but compatible with chronic small vessel ischemic disease. There is no acute intracranial hemorrhage. No acute demarcated cortical infarct. No extra-axial fluid collection. No evidence of intracranial mass. No midline shift. Vascular: No hyperdense vessel.  Atherosclerotic calcifications. Skull: Left-sided cranioplasty.  No calvarial fracture.  Sinuses/Orbits: Visualized orbits show no acute finding. No significant paranasal sinus disease at the imaged levels. Other: Subtle left frontal scalp soft tissue swelling. CT CERVICAL SPINE FINDINGS Alignment: Straightening of the expected cervical lordosis. 2 mm C4-C5 grade 1 anterolisthesis. Skull base and vertebrae: The basion-dental and atlanto-dental intervals are maintained.No evidence of acute fracture to the cervical spine. Soft tissues and spinal canal: No prevertebral fluid or swelling. No visible canal hematoma. Disc levels:  Cervical spondylosis with multilevel disc space narrowing, disc bulges, posterior disc osteophytes, uncovertebral hypertrophy and facet arthrosis. Disc space narrowing is severe at C5-C6 and C6-C7. A prominent C5-C6 posterior disc osteophyte contributes to apparent mild/moderate spinal canal stenosis. Upper chest: No consolidation within the imaged lung apices. No visible pneumothorax IMPRESSION: CT head: 1. No evidence of acute intracranial abnormality. 2. Mild left frontal scalp soft tissue swelling. 3. Redemonstrated large region of chronic encephalomalacia within the anterior left cerebral hemisphere. 4. Chronic lacunar infarcts within the bilateral basal ganglia are new from the prior head CT of 02/03/2017. 5. Background mild cerebral atrophy and chronic small vessel ischemic disease. CT cervical spine. 1. No evidence of acute fracture to the cervical spine. 2. 2 mm C4-C5 grade 1 anterolisthesis. 3. Degenerative changes as described. Electronically Signed   By: Kellie Simmering DO   On: 01/22/2020 13:18   DG Chest Port 1 View  Result Date: 01/22/2020 CLINICAL DATA:  Weakness EXAM: PORTABLE CHEST 1 VIEW COMPARISON:  02/04/2017 FINDINGS: No focal consolidation. No pleural effusion or pneumothorax. Heart and mediastinal contours are unremarkable. No acute osseous abnormality. IMPRESSION: No active disease. Electronically Signed   By: Kathreen Devoid   On: 01/22/2020 12:32   DG Shoulder Left Port  Result Date: 01/22/2020 CLINICAL DATA:  Weakness, pain EXAM: LEFT SHOULDER COMPARISON:  None. FINDINGS: Generalized osteopenia. No fracture or dislocation. No aggressive osseous lesion. Mild arthropathy of the acromioclavicular joint. Glenohumeral joint is unremarkable. Soft tissues are unremarkable. IMPRESSION: No acute osseous injury of the left shoulder. Electronically Signed   By: Kathreen Devoid   On: 01/22/2020 12:31   ECHOCARDIOGRAM COMPLETE  Result Date: 01/23/2020    ECHOCARDIOGRAM REPORT   Patient Name:    Ashley [name] Date of Exam: 01/23/2020 Medical Rec #:  [id number]     Height:       65.0 in Accession #:    [id number]    Weight:       104.5 lb Date of Birth:  1928/05/09    BSA:          1.501 m Patient Age:    80 years      BP:           178/92 mmHg Patient Gender: F             HR:           88 bpm. Exam Location:  Inpatient Procedure: 2D Echo and Intracardiac Opacification Agent Indications:    Elevated Troponin  History:        Patient has no prior history of Echocardiogram examinations.                 Risk Factors:Hypertension and Dyslipidemia. Advanced dementia,                 GERD, presented with fall and hip pain. Paroxysmal a-fib.  Sonographer:    Darlina Sicilian RDCS Referring Phys: [id number] [place]  Sonographer Comments: Lopressor given during echocardiogram 2.5mg  IMPRESSIONS  1. Left ventricular ejection fraction,  by estimation, is 60 to 65%. The left ventricle has normal function. The left ventricle has no regional wall motion abnormalities. Left ventricular diastolic parameters are consistent with Grade I diastolic dysfunction (impaired relaxation). Elevated left atrial pressure.  2. Right ventricular systolic function is normal. The right ventricular size is normal. There is normal pulmonary artery systolic pressure.  3. Left atrial size was moderately dilated.  4. The mitral valve was not well visualized. No evidence of mitral valve regurgitation. No evidence of mitral stenosis.  5. The aortic valve is tricuspid. Aortic valve regurgitation is not visualized. No aortic stenosis is present. FINDINGS  Left Ventricle: Left ventricular ejection fraction, by estimation, is 60 to 65%. The left ventricle has normal function. The left ventricle has no regional wall motion abnormalities. Definity contrast agent was given IV to delineate the left ventricular  endocardial borders. The left ventricular internal cavity size was normal in size. There is no left ventricular hypertrophy. Left ventricular  diastolic parameters are consistent with Grade I diastolic dysfunction (impaired relaxation). Elevated left atrial pressure. Right Ventricle: The right ventricular size is normal. No increase in right ventricular wall thickness. Right ventricular systolic function is normal. There is normal pulmonary artery systolic pressure. The tricuspid regurgitant velocity is 2.85 m/s, and  with an assumed right atrial pressure of 3 mmHg, the estimated right ventricular systolic pressure is 13.2 mmHg. Left Atrium: Left atrial size was moderately dilated. Right Atrium: Right atrial size was not well visualized. Pericardium: There is no evidence of pericardial effusion. Mitral Valve: The mitral valve was not well visualized. No evidence of mitral valve regurgitation. No evidence of mitral valve stenosis. Tricuspid Valve: The tricuspid valve is normal in structure. Tricuspid valve regurgitation is mild . No evidence of tricuspid stenosis. Aortic Valve: The aortic valve is tricuspid. Aortic valve regurgitation is not visualized. No aortic stenosis is present. Pulmonic Valve: The pulmonic valve was not well visualized. Pulmonic valve regurgitation is not visualized. No evidence of pulmonic stenosis. Aorta: The aortic root is normal in size and structure. Pulmonary Artery: Indeterminant PASP, IVC poorly visualized. Venous: The inferior vena cava was not well visualized. IAS/Shunts: No atrial level shunt detected by color flow Doppler.  LEFT VENTRICLE PLAX 2D LVOT diam:     1.90 cm     Diastology LV SV:         35          LV e' medial:    2.35 cm/s LV SV Index:   23          LV E/e' medial:  21.1 LVOT Area:     2.84 cm    LV e' lateral:   2.59 cm/s                            LV E/e' lateral: 19.2  LV Volumes (MOD) LV vol d, MOD A4C: 42.1 ml LV SV MOD A4C:     42.1 ml RIGHT VENTRICLE RV S prime:     9.03 cm/s LEFT ATRIUM             Index       RIGHT ATRIUM           Index LA Vol (A2C):   39.3 ml 26.18 ml/m RA Area:     14.30 cm LA  Vol (A4C):   64.7 ml 43.10 ml/m RA Volume:   35.20 ml  23.45 ml/m LA Biplane Vol: 55.2 ml 36.77  ml/m  AORTIC VALVE LVOT Vmax:   90.61 cm/s LVOT Vmean:  60.578 cm/s LVOT VTI:    0.124 m  AORTA Ao Root diam: 3.00 cm MITRAL VALVE               TRICUSPID VALVE MV Area (PHT): 4.60 cm    TR Peak grad:   32.5 mmHg MV Decel Time: 165 msec    TR Vmax:        285.00 cm/s MV E velocity: 49.70 cm/s MV A velocity: 81.00 cm/s  SHUNTS MV E/A ratio:  0.61        Systemic VTI:  0.12 m                            Systemic Diam: 1.90 cm Carlyle Dolly MD Electronically signed by Carlyle Dolly MD Signature Date/Time: 01/23/2020/1:45:16 PM    Final    DG Hip Unilat W or Wo Pelvis 2-3 Views Left  Result Date: 01/22/2020 CLINICAL DATA:  Weakness.  Weakness, pain. EXAM: DG HIP (WITH OR WITHOUT PELVIS) 2-3V LEFT COMPARISON:  Radiographs of the left hip 12/29/2009. CT abdomen/pelvis 05/15/2019. FINDINGS: Mildly displaced fracture of the left superior pubic ramus. No other fracture is identified. No dislocation of the left hip. Degenerative changes of the bilateral femoroacetabular and sacroiliac joints. IMPRESSION: Mildly displaced fracture of the left superior pubic ramus. This finding was not present on the prior CT of 05/15/2019 and may be acute. Degenerative changes of the bilateral femoroacetabular and sacroiliac joints. Electronically Signed   By: Kellie Simmering DO   On: 01/22/2020 12:35    Microbiology: Recent Results (from the past 240 hour(s))  Respiratory Panel by RT PCR (Flu A&B, Covid) - Nasopharyngeal Swab     Status: None   Collection Time: 01/22/20 11:49 AM   Specimen: Nasopharyngeal Swab  Result Value Ref Range Status   SARS Coronavirus 2 by RT PCR NEGATIVE NEGATIVE Final    Comment: (NOTE) SARS-CoV-2 target nucleic acids are NOT DETECTED.  The SARS-CoV-2 RNA is generally detectable in upper respiratoy specimens during the acute phase of infection. The lowest concentration of SARS-CoV-2 viral copies this  assay can detect is 131 copies/mL. A negative result does not preclude SARS-Cov-2 infection and should not be used as the sole basis for treatment or other patient management decisions. A negative result may occur with  improper specimen collection/handling, submission of specimen other than nasopharyngeal swab, presence of viral mutation(s) within the areas targeted by this assay, and inadequate number of viral copies (<131 copies/mL). A negative result must be combined with clinical observations, patient history, and epidemiological information. The expected result is Negative.  Fact Sheet for Patients:  PinkCheek.be  Fact Sheet for Healthcare Providers:  GravelBags.it  This test is no t yet approved or cleared by the [place] FDA and  has been authorized for detection and/or diagnosis of SARS-CoV-2 by FDA under an Emergency Use Authorization (EUA). This EUA will remain  in effect (meaning this test can be used) for the duration of the COVID-19 declaration under Section 564(b)(1) of the Act, 21 U.S.C. section 360bbb-3(b)(1), unless the authorization is terminated or revoked sooner.     Influenza A by PCR NEGATIVE NEGATIVE Final   Influenza B by PCR NEGATIVE NEGATIVE Final    Comment: (NOTE) The Xpert Xpress SARS-CoV-2/FLU/RSV assay is intended as an aid in  the diagnosis of influenza from Nasopharyngeal swab specimens and  should not be used as  a sole basis for treatment. Nasal washings and  aspirates are unacceptable for Xpert Xpress SARS-CoV-2/FLU/RSV  testing.  Fact Sheet for Patients: PinkCheek.be  Fact Sheet for Healthcare Providers: GravelBags.it  This test is not yet approved or cleared by the [place] FDA and  has been authorized for detection and/or diagnosis of SARS-CoV-2 by  FDA under an Emergency Use Authorization (EUA). This EUA will  remain  in effect (meaning this test can be used) for the duration of the  Covid-19 declaration under Section 564(b)(1) of the Act, 21  U.S.C. section 360bbb-3(b)(1), unless the authorization is  terminated or revoked. Performed at [facility], [place] [address] [place], [place] [address]      Labs: CBC: Recent Labs  Lab 01/22/20 1347 01/24/20 0733  WBC 11.0* 10.2  NEUTROABS 9.1*  --   HGB 12.1 11.1*  HCT 38.8 35.0*  MCV 104.6* 102.6*  PLT 149* 725   Basic Metabolic Panel: Recent Labs  Lab 01/22/20 1147 01/22/20 1347 01/23/20 1242 01/24/20 0733  NA 143  --  136 140  K 3.1*  --  3.9 4.9  CL 108  --  106 108  CO2 22  --  19* 24  GLUCOSE 98  --  193* 108*  BUN 13  --  12 14  CREATININE 1.01*  --  0.90 0.84  CALCIUM 9.0  --  8.4* 9.5  MG  --  1.7 1.5* 2.2  PHOS  --  2.4*  --   --    Liver Function Tests: Recent Labs  Lab 01/22/20 1147  AST 24  ALT 18  ALKPHOS 48  BILITOT 1.2  PROT 5.7*  ALBUMIN 2.7*   CBG: Recent Labs  Lab 01/23/20 0742 01/23/20 1150 01/23/20 1655  GLUCAP 86 191* 111*    Time spent: 35 minutes  Signed:  Berle Mull  [facility] 01/24/2020 8:30 AM

## 2020-01-26 DIAGNOSIS — G9389 Other specified disorders of brain: Secondary | ICD-10-CM | POA: Diagnosis not present

## 2020-01-26 DIAGNOSIS — R636 Underweight: Secondary | ICD-10-CM | POA: Diagnosis not present

## 2020-01-26 DIAGNOSIS — M47812 Spondylosis without myelopathy or radiculopathy, cervical region: Secondary | ICD-10-CM | POA: Diagnosis not present

## 2020-01-26 DIAGNOSIS — E785 Hyperlipidemia, unspecified: Secondary | ICD-10-CM | POA: Diagnosis not present

## 2020-01-26 DIAGNOSIS — M5020 Other cervical disc displacement, unspecified cervical region: Secondary | ICD-10-CM | POA: Diagnosis not present

## 2020-01-26 DIAGNOSIS — M2578 Osteophyte, vertebrae: Secondary | ICD-10-CM | POA: Diagnosis not present

## 2020-01-26 DIAGNOSIS — M069 Rheumatoid arthritis, unspecified: Secondary | ICD-10-CM | POA: Diagnosis not present

## 2020-01-26 DIAGNOSIS — N814 Uterovaginal prolapse, unspecified: Secondary | ICD-10-CM | POA: Diagnosis not present

## 2020-01-26 DIAGNOSIS — Z85528 Personal history of other malignant neoplasm of kidney: Secondary | ICD-10-CM | POA: Diagnosis not present

## 2020-01-26 DIAGNOSIS — I082 Rheumatic disorders of both aortic and tricuspid valves: Secondary | ICD-10-CM | POA: Diagnosis not present

## 2020-01-26 DIAGNOSIS — M461 Sacroiliitis, not elsewhere classified: Secondary | ICD-10-CM | POA: Diagnosis not present

## 2020-01-26 DIAGNOSIS — S3289XA Fracture of other parts of pelvis, initial encounter for closed fracture: Secondary | ICD-10-CM | POA: Diagnosis not present

## 2020-01-26 DIAGNOSIS — J45909 Unspecified asthma, uncomplicated: Secondary | ICD-10-CM | POA: Diagnosis not present

## 2020-01-26 DIAGNOSIS — K219 Gastro-esophageal reflux disease without esophagitis: Secondary | ICD-10-CM | POA: Diagnosis not present

## 2020-01-26 DIAGNOSIS — M4802 Spinal stenosis, cervical region: Secondary | ICD-10-CM | POA: Diagnosis not present

## 2020-01-26 DIAGNOSIS — F0280 Dementia in other diseases classified elsewhere without behavioral disturbance: Secondary | ICD-10-CM | POA: Diagnosis not present

## 2020-01-26 DIAGNOSIS — Z8673 Personal history of transient ischemic attack (TIA), and cerebral infarction without residual deficits: Secondary | ICD-10-CM | POA: Diagnosis not present

## 2020-01-26 DIAGNOSIS — S32592D Other specified fracture of left pubis, subsequent encounter for fracture with routine healing: Secondary | ICD-10-CM | POA: Diagnosis not present

## 2020-01-26 DIAGNOSIS — M8588 Other specified disorders of bone density and structure, other site: Secondary | ICD-10-CM | POA: Diagnosis not present

## 2020-01-26 DIAGNOSIS — M4312 Spondylolisthesis, cervical region: Secondary | ICD-10-CM | POA: Diagnosis not present

## 2020-01-26 DIAGNOSIS — I480 Paroxysmal atrial fibrillation: Secondary | ICD-10-CM | POA: Diagnosis not present

## 2020-01-26 DIAGNOSIS — G319 Degenerative disease of nervous system, unspecified: Secondary | ICD-10-CM | POA: Diagnosis not present

## 2020-01-26 DIAGNOSIS — I10 Essential (primary) hypertension: Secondary | ICD-10-CM | POA: Diagnosis not present

## 2020-01-26 DIAGNOSIS — Z681 Body mass index (BMI) 19 or less, adult: Secondary | ICD-10-CM | POA: Diagnosis not present

## 2020-01-26 DIAGNOSIS — M150 Primary generalized (osteo)arthritis: Secondary | ICD-10-CM | POA: Diagnosis not present

## 2020-01-29 DIAGNOSIS — I480 Paroxysmal atrial fibrillation: Secondary | ICD-10-CM | POA: Diagnosis not present

## 2020-01-29 DIAGNOSIS — F0280 Dementia in other diseases classified elsewhere without behavioral disturbance: Secondary | ICD-10-CM | POA: Diagnosis not present

## 2020-01-29 DIAGNOSIS — S32592D Other specified fracture of left pubis, subsequent encounter for fracture with routine healing: Secondary | ICD-10-CM | POA: Diagnosis not present

## 2020-01-29 DIAGNOSIS — M069 Rheumatoid arthritis, unspecified: Secondary | ICD-10-CM | POA: Diagnosis not present

## 2020-01-29 DIAGNOSIS — G319 Degenerative disease of nervous system, unspecified: Secondary | ICD-10-CM | POA: Diagnosis not present

## 2020-01-29 DIAGNOSIS — I10 Essential (primary) hypertension: Secondary | ICD-10-CM | POA: Diagnosis not present

## 2020-02-01 DIAGNOSIS — I480 Paroxysmal atrial fibrillation: Secondary | ICD-10-CM | POA: Diagnosis not present

## 2020-02-01 DIAGNOSIS — F0280 Dementia in other diseases classified elsewhere without behavioral disturbance: Secondary | ICD-10-CM | POA: Diagnosis not present

## 2020-02-01 DIAGNOSIS — G319 Degenerative disease of nervous system, unspecified: Secondary | ICD-10-CM | POA: Diagnosis not present

## 2020-02-01 DIAGNOSIS — S32592D Other specified fracture of left pubis, subsequent encounter for fracture with routine healing: Secondary | ICD-10-CM | POA: Diagnosis not present

## 2020-02-01 DIAGNOSIS — I10 Essential (primary) hypertension: Secondary | ICD-10-CM | POA: Diagnosis not present

## 2020-02-01 DIAGNOSIS — M069 Rheumatoid arthritis, unspecified: Secondary | ICD-10-CM | POA: Diagnosis not present

## 2020-02-03 DIAGNOSIS — I480 Paroxysmal atrial fibrillation: Secondary | ICD-10-CM | POA: Diagnosis not present

## 2020-02-03 DIAGNOSIS — I10 Essential (primary) hypertension: Secondary | ICD-10-CM | POA: Diagnosis not present

## 2020-02-03 DIAGNOSIS — F0280 Dementia in other diseases classified elsewhere without behavioral disturbance: Secondary | ICD-10-CM | POA: Diagnosis not present

## 2020-02-03 DIAGNOSIS — Z79899 Other long term (current) drug therapy: Secondary | ICD-10-CM | POA: Diagnosis not present

## 2020-02-03 DIAGNOSIS — S32592D Other specified fracture of left pubis, subsequent encounter for fracture with routine healing: Secondary | ICD-10-CM | POA: Diagnosis not present

## 2020-02-03 DIAGNOSIS — I4891 Unspecified atrial fibrillation: Secondary | ICD-10-CM | POA: Diagnosis not present

## 2020-02-03 DIAGNOSIS — R1084 Generalized abdominal pain: Secondary | ICD-10-CM | POA: Diagnosis not present

## 2020-02-03 DIAGNOSIS — M810 Age-related osteoporosis without current pathological fracture: Secondary | ICD-10-CM | POA: Diagnosis not present

## 2020-02-03 DIAGNOSIS — G319 Degenerative disease of nervous system, unspecified: Secondary | ICD-10-CM | POA: Diagnosis not present

## 2020-02-03 DIAGNOSIS — M069 Rheumatoid arthritis, unspecified: Secondary | ICD-10-CM | POA: Diagnosis not present

## 2020-02-03 DIAGNOSIS — S32592A Other specified fracture of left pubis, initial encounter for closed fracture: Secondary | ICD-10-CM | POA: Diagnosis not present

## 2020-02-03 DIAGNOSIS — R911 Solitary pulmonary nodule: Secondary | ICD-10-CM | POA: Diagnosis not present

## 2020-02-04 DIAGNOSIS — G319 Degenerative disease of nervous system, unspecified: Secondary | ICD-10-CM | POA: Diagnosis not present

## 2020-02-04 DIAGNOSIS — I10 Essential (primary) hypertension: Secondary | ICD-10-CM | POA: Diagnosis not present

## 2020-02-04 DIAGNOSIS — I480 Paroxysmal atrial fibrillation: Secondary | ICD-10-CM | POA: Diagnosis not present

## 2020-02-04 DIAGNOSIS — F0280 Dementia in other diseases classified elsewhere without behavioral disturbance: Secondary | ICD-10-CM | POA: Diagnosis not present

## 2020-02-04 DIAGNOSIS — S32592D Other specified fracture of left pubis, subsequent encounter for fracture with routine healing: Secondary | ICD-10-CM | POA: Diagnosis not present

## 2020-02-04 DIAGNOSIS — M069 Rheumatoid arthritis, unspecified: Secondary | ICD-10-CM | POA: Diagnosis not present

## 2020-02-05 DIAGNOSIS — G319 Degenerative disease of nervous system, unspecified: Secondary | ICD-10-CM | POA: Diagnosis not present

## 2020-02-05 DIAGNOSIS — I10 Essential (primary) hypertension: Secondary | ICD-10-CM | POA: Diagnosis not present

## 2020-02-05 DIAGNOSIS — F0280 Dementia in other diseases classified elsewhere without behavioral disturbance: Secondary | ICD-10-CM | POA: Diagnosis not present

## 2020-02-05 DIAGNOSIS — S32592D Other specified fracture of left pubis, subsequent encounter for fracture with routine healing: Secondary | ICD-10-CM | POA: Diagnosis not present

## 2020-02-05 DIAGNOSIS — I480 Paroxysmal atrial fibrillation: Secondary | ICD-10-CM | POA: Diagnosis not present

## 2020-02-05 DIAGNOSIS — M069 Rheumatoid arthritis, unspecified: Secondary | ICD-10-CM | POA: Diagnosis not present

## 2020-02-08 DIAGNOSIS — G319 Degenerative disease of nervous system, unspecified: Secondary | ICD-10-CM | POA: Diagnosis not present

## 2020-02-08 DIAGNOSIS — F0280 Dementia in other diseases classified elsewhere without behavioral disturbance: Secondary | ICD-10-CM | POA: Diagnosis not present

## 2020-02-08 DIAGNOSIS — I10 Essential (primary) hypertension: Secondary | ICD-10-CM | POA: Diagnosis not present

## 2020-02-08 DIAGNOSIS — I480 Paroxysmal atrial fibrillation: Secondary | ICD-10-CM | POA: Diagnosis not present

## 2020-02-08 DIAGNOSIS — M069 Rheumatoid arthritis, unspecified: Secondary | ICD-10-CM | POA: Diagnosis not present

## 2020-02-08 DIAGNOSIS — S32592D Other specified fracture of left pubis, subsequent encounter for fracture with routine healing: Secondary | ICD-10-CM | POA: Diagnosis not present

## 2020-02-12 DIAGNOSIS — G319 Degenerative disease of nervous system, unspecified: Secondary | ICD-10-CM | POA: Diagnosis not present

## 2020-02-12 DIAGNOSIS — S32592D Other specified fracture of left pubis, subsequent encounter for fracture with routine healing: Secondary | ICD-10-CM | POA: Diagnosis not present

## 2020-02-12 DIAGNOSIS — M069 Rheumatoid arthritis, unspecified: Secondary | ICD-10-CM | POA: Diagnosis not present

## 2020-02-12 DIAGNOSIS — I480 Paroxysmal atrial fibrillation: Secondary | ICD-10-CM | POA: Diagnosis not present

## 2020-02-12 DIAGNOSIS — F0280 Dementia in other diseases classified elsewhere without behavioral disturbance: Secondary | ICD-10-CM | POA: Diagnosis not present

## 2020-02-12 DIAGNOSIS — I10 Essential (primary) hypertension: Secondary | ICD-10-CM | POA: Diagnosis not present

## 2020-02-17 DIAGNOSIS — S32592D Other specified fracture of left pubis, subsequent encounter for fracture with routine healing: Secondary | ICD-10-CM | POA: Diagnosis not present

## 2020-02-17 DIAGNOSIS — F0280 Dementia in other diseases classified elsewhere without behavioral disturbance: Secondary | ICD-10-CM | POA: Diagnosis not present

## 2020-02-17 DIAGNOSIS — I10 Essential (primary) hypertension: Secondary | ICD-10-CM | POA: Diagnosis not present

## 2020-02-17 DIAGNOSIS — I480 Paroxysmal atrial fibrillation: Secondary | ICD-10-CM | POA: Diagnosis not present

## 2020-02-17 DIAGNOSIS — G319 Degenerative disease of nervous system, unspecified: Secondary | ICD-10-CM | POA: Diagnosis not present

## 2020-02-17 DIAGNOSIS — M069 Rheumatoid arthritis, unspecified: Secondary | ICD-10-CM | POA: Diagnosis not present

## 2020-02-18 DIAGNOSIS — I480 Paroxysmal atrial fibrillation: Secondary | ICD-10-CM | POA: Diagnosis not present

## 2020-02-18 DIAGNOSIS — G319 Degenerative disease of nervous system, unspecified: Secondary | ICD-10-CM | POA: Diagnosis not present

## 2020-02-18 DIAGNOSIS — F0280 Dementia in other diseases classified elsewhere without behavioral disturbance: Secondary | ICD-10-CM | POA: Diagnosis not present

## 2020-02-18 DIAGNOSIS — M069 Rheumatoid arthritis, unspecified: Secondary | ICD-10-CM | POA: Diagnosis not present

## 2020-02-18 DIAGNOSIS — I10 Essential (primary) hypertension: Secondary | ICD-10-CM | POA: Diagnosis not present

## 2020-02-18 DIAGNOSIS — S32592D Other specified fracture of left pubis, subsequent encounter for fracture with routine healing: Secondary | ICD-10-CM | POA: Diagnosis not present

## 2020-02-24 DIAGNOSIS — S32592D Other specified fracture of left pubis, subsequent encounter for fracture with routine healing: Secondary | ICD-10-CM | POA: Diagnosis not present

## 2020-02-24 DIAGNOSIS — F0280 Dementia in other diseases classified elsewhere without behavioral disturbance: Secondary | ICD-10-CM | POA: Diagnosis not present

## 2020-02-24 DIAGNOSIS — I480 Paroxysmal atrial fibrillation: Secondary | ICD-10-CM | POA: Diagnosis not present

## 2020-02-24 DIAGNOSIS — I10 Essential (primary) hypertension: Secondary | ICD-10-CM | POA: Diagnosis not present

## 2020-02-24 DIAGNOSIS — G319 Degenerative disease of nervous system, unspecified: Secondary | ICD-10-CM | POA: Diagnosis not present

## 2020-02-24 DIAGNOSIS — M069 Rheumatoid arthritis, unspecified: Secondary | ICD-10-CM | POA: Diagnosis not present

## 2020-02-25 DIAGNOSIS — M8588 Other specified disorders of bone density and structure, other site: Secondary | ICD-10-CM | POA: Diagnosis not present

## 2020-02-25 DIAGNOSIS — S32592D Other specified fracture of left pubis, subsequent encounter for fracture with routine healing: Secondary | ICD-10-CM | POA: Diagnosis not present

## 2020-02-25 DIAGNOSIS — M4802 Spinal stenosis, cervical region: Secondary | ICD-10-CM | POA: Diagnosis not present

## 2020-02-25 DIAGNOSIS — I082 Rheumatic disorders of both aortic and tricuspid valves: Secondary | ICD-10-CM | POA: Diagnosis not present

## 2020-02-25 DIAGNOSIS — E785 Hyperlipidemia, unspecified: Secondary | ICD-10-CM | POA: Diagnosis not present

## 2020-02-25 DIAGNOSIS — J45909 Unspecified asthma, uncomplicated: Secondary | ICD-10-CM | POA: Diagnosis not present

## 2020-02-25 DIAGNOSIS — Z8673 Personal history of transient ischemic attack (TIA), and cerebral infarction without residual deficits: Secondary | ICD-10-CM | POA: Diagnosis not present

## 2020-02-25 DIAGNOSIS — R636 Underweight: Secondary | ICD-10-CM | POA: Diagnosis not present

## 2020-02-25 DIAGNOSIS — Z681 Body mass index (BMI) 19 or less, adult: Secondary | ICD-10-CM | POA: Diagnosis not present

## 2020-02-25 DIAGNOSIS — M47812 Spondylosis without myelopathy or radiculopathy, cervical region: Secondary | ICD-10-CM | POA: Diagnosis not present

## 2020-02-25 DIAGNOSIS — M150 Primary generalized (osteo)arthritis: Secondary | ICD-10-CM | POA: Diagnosis not present

## 2020-02-25 DIAGNOSIS — I10 Essential (primary) hypertension: Secondary | ICD-10-CM | POA: Diagnosis not present

## 2020-02-25 DIAGNOSIS — I480 Paroxysmal atrial fibrillation: Secondary | ICD-10-CM | POA: Diagnosis not present

## 2020-02-25 DIAGNOSIS — M461 Sacroiliitis, not elsewhere classified: Secondary | ICD-10-CM | POA: Diagnosis not present

## 2020-02-25 DIAGNOSIS — N814 Uterovaginal prolapse, unspecified: Secondary | ICD-10-CM | POA: Diagnosis not present

## 2020-02-25 DIAGNOSIS — M069 Rheumatoid arthritis, unspecified: Secondary | ICD-10-CM | POA: Diagnosis not present

## 2020-02-25 DIAGNOSIS — G9389 Other specified disorders of brain: Secondary | ICD-10-CM | POA: Diagnosis not present

## 2020-02-25 DIAGNOSIS — M5020 Other cervical disc displacement, unspecified cervical region: Secondary | ICD-10-CM | POA: Diagnosis not present

## 2020-02-25 DIAGNOSIS — F0280 Dementia in other diseases classified elsewhere without behavioral disturbance: Secondary | ICD-10-CM | POA: Diagnosis not present

## 2020-02-25 DIAGNOSIS — M2578 Osteophyte, vertebrae: Secondary | ICD-10-CM | POA: Diagnosis not present

## 2020-02-25 DIAGNOSIS — G319 Degenerative disease of nervous system, unspecified: Secondary | ICD-10-CM | POA: Diagnosis not present

## 2020-02-25 DIAGNOSIS — Z85528 Personal history of other malignant neoplasm of kidney: Secondary | ICD-10-CM | POA: Diagnosis not present

## 2020-02-25 DIAGNOSIS — K219 Gastro-esophageal reflux disease without esophagitis: Secondary | ICD-10-CM | POA: Diagnosis not present

## 2020-02-25 DIAGNOSIS — M4312 Spondylolisthesis, cervical region: Secondary | ICD-10-CM | POA: Diagnosis not present

## 2020-03-02 DIAGNOSIS — S32592D Other specified fracture of left pubis, subsequent encounter for fracture with routine healing: Secondary | ICD-10-CM | POA: Diagnosis not present

## 2020-03-02 DIAGNOSIS — G319 Degenerative disease of nervous system, unspecified: Secondary | ICD-10-CM | POA: Diagnosis not present

## 2020-03-02 DIAGNOSIS — I480 Paroxysmal atrial fibrillation: Secondary | ICD-10-CM | POA: Diagnosis not present

## 2020-03-02 DIAGNOSIS — M069 Rheumatoid arthritis, unspecified: Secondary | ICD-10-CM | POA: Diagnosis not present

## 2020-03-02 DIAGNOSIS — I10 Essential (primary) hypertension: Secondary | ICD-10-CM | POA: Diagnosis not present

## 2020-03-02 DIAGNOSIS — F0280 Dementia in other diseases classified elsewhere without behavioral disturbance: Secondary | ICD-10-CM | POA: Diagnosis not present

## 2020-03-07 ENCOUNTER — Other Ambulatory Visit: Payer: Self-pay

## 2020-03-07 ENCOUNTER — Emergency Department (HOSPITAL_COMMUNITY): Payer: Medicare Other

## 2020-03-07 ENCOUNTER — Observation Stay (HOSPITAL_COMMUNITY)
Admission: EM | Admit: 2020-03-07 | Discharge: 2020-03-08 | Disposition: A | Discharge status: Home / Self Care | Payer: Medicare Other | Attending: Family Medicine | Admitting: Family Medicine

## 2020-03-07 DIAGNOSIS — Z20822 Contact with and (suspected) exposure to covid-19: Secondary | ICD-10-CM | POA: Insufficient documentation

## 2020-03-07 DIAGNOSIS — I959 Hypotension, unspecified: Secondary | ICD-10-CM | POA: Diagnosis not present

## 2020-03-07 DIAGNOSIS — F0280 Dementia in other diseases classified elsewhere without behavioral disturbance: Secondary | ICD-10-CM | POA: Diagnosis not present

## 2020-03-07 DIAGNOSIS — I480 Paroxysmal atrial fibrillation: Secondary | ICD-10-CM | POA: Diagnosis not present

## 2020-03-07 DIAGNOSIS — J45909 Unspecified asthma, uncomplicated: Secondary | ICD-10-CM | POA: Insufficient documentation

## 2020-03-07 DIAGNOSIS — N179 Acute kidney failure, unspecified: Secondary | ICD-10-CM

## 2020-03-07 DIAGNOSIS — I10 Essential (primary) hypertension: Secondary | ICD-10-CM | POA: Insufficient documentation

## 2020-03-07 DIAGNOSIS — F0390 Unspecified dementia without behavioral disturbance: Secondary | ICD-10-CM | POA: Diagnosis not present

## 2020-03-07 DIAGNOSIS — I4891 Unspecified atrial fibrillation: Secondary | ICD-10-CM

## 2020-03-07 DIAGNOSIS — Z79899 Other long term (current) drug therapy: Secondary | ICD-10-CM | POA: Diagnosis not present

## 2020-03-07 DIAGNOSIS — R6520 Severe sepsis without septic shock: Secondary | ICD-10-CM | POA: Diagnosis not present

## 2020-03-07 DIAGNOSIS — Z7901 Long term (current) use of anticoagulants: Secondary | ICD-10-CM | POA: Diagnosis not present

## 2020-03-07 DIAGNOSIS — M069 Rheumatoid arthritis, unspecified: Secondary | ICD-10-CM | POA: Diagnosis not present

## 2020-03-07 DIAGNOSIS — J9811 Atelectasis: Secondary | ICD-10-CM | POA: Diagnosis not present

## 2020-03-07 DIAGNOSIS — R0902 Hypoxemia: Secondary | ICD-10-CM | POA: Diagnosis not present

## 2020-03-07 DIAGNOSIS — Z789 Other specified health status: Secondary | ICD-10-CM | POA: Diagnosis not present

## 2020-03-07 DIAGNOSIS — I471 Supraventricular tachycardia: Secondary | ICD-10-CM | POA: Diagnosis not present

## 2020-03-07 DIAGNOSIS — Z7189 Other specified counseling: Secondary | ICD-10-CM | POA: Diagnosis not present

## 2020-03-07 DIAGNOSIS — Z66 Do not resuscitate: Secondary | ICD-10-CM | POA: Diagnosis not present

## 2020-03-07 DIAGNOSIS — Z85528 Personal history of other malignant neoplasm of kidney: Secondary | ICD-10-CM | POA: Diagnosis not present

## 2020-03-07 DIAGNOSIS — G319 Degenerative disease of nervous system, unspecified: Secondary | ICD-10-CM | POA: Diagnosis not present

## 2020-03-07 DIAGNOSIS — Z515 Encounter for palliative care: Secondary | ICD-10-CM

## 2020-03-07 DIAGNOSIS — R0689 Other abnormalities of breathing: Secondary | ICD-10-CM | POA: Diagnosis not present

## 2020-03-07 DIAGNOSIS — A419 Sepsis, unspecified organism: Secondary | ICD-10-CM | POA: Diagnosis not present

## 2020-03-07 DIAGNOSIS — R000 Tachycardia, unspecified: Secondary | ICD-10-CM | POA: Diagnosis not present

## 2020-03-07 DIAGNOSIS — S32592D Other specified fracture of left pubis, subsequent encounter for fracture with routine healing: Secondary | ICD-10-CM | POA: Diagnosis not present

## 2020-03-07 LAB — CBC WITH DIFFERENTIAL/PLATELET
Abs Immature Granulocytes: 0.11 10*3/uL — ABNORMAL HIGH (ref 0.00–0.07)
Basophils Absolute: 0.1 10*3/uL (ref 0.0–0.1)
Basophils Relative: 0 %
Eosinophils Absolute: 0 10*3/uL (ref 0.0–0.5)
Eosinophils Relative: 0 %
HCT: 55.5 % — ABNORMAL HIGH (ref 36.0–46.0)
Hemoglobin: 15.5 g/dL — ABNORMAL HIGH (ref 12.0–15.0)
Immature Granulocytes: 1 %
Lymphocytes Relative: 10 %
Lymphs Abs: 1.3 10*3/uL (ref 0.7–4.0)
MCH: 32.3 pg (ref 26.0–34.0)
MCHC: 27.9 g/dL — ABNORMAL LOW (ref 30.0–36.0)
MCV: 115.6 fL — ABNORMAL HIGH (ref 80.0–100.0)
Monocytes Absolute: 0.8 10*3/uL (ref 0.1–1.0)
Monocytes Relative: 6 %
Neutro Abs: 9.8 10*3/uL — ABNORMAL HIGH (ref 1.7–7.7)
Neutrophils Relative %: 83 %
Platelets: 115 10*3/uL — ABNORMAL LOW (ref 150–400)
RBC: 4.8 MIL/uL (ref 3.87–5.11)
RDW: 15.4 % (ref 11.5–15.5)
WBC: 12 10*3/uL — ABNORMAL HIGH (ref 4.0–10.5)
nRBC: 1.8 % — ABNORMAL HIGH (ref 0.0–0.2)

## 2020-03-07 LAB — COMPREHENSIVE METABOLIC PANEL
ALT: 19 U/L (ref 0–44)
AST: 35 U/L (ref 15–41)
Albumin: 3.2 g/dL — ABNORMAL LOW (ref 3.5–5.0)
Alkaline Phosphatase: 88 U/L (ref 38–126)
Anion gap: 23 — ABNORMAL HIGH (ref 5–15)
BUN: 55 mg/dL — ABNORMAL HIGH (ref 8–23)
CO2: 15 mmol/L — ABNORMAL LOW (ref 22–32)
Calcium: 9.9 mg/dL (ref 8.9–10.3)
Chloride: 116 mmol/L — ABNORMAL HIGH (ref 98–111)
Creatinine, Ser: 2.03 mg/dL — ABNORMAL HIGH (ref 0.44–1.00)
GFR, Estimated: 23 mL/min — ABNORMAL LOW (ref >60)
Glucose, Bld: 97 mg/dL (ref 70–99)
Potassium: 4.3 mmol/L (ref 3.5–5.1)
Sodium: 154 mmol/L — ABNORMAL HIGH (ref 135–145)
Total Bilirubin: 1.1 mg/dL (ref 0.3–1.2)
Total Protein: 6 g/dL — ABNORMAL LOW (ref 6.5–8.1)

## 2020-03-07 LAB — RESP PANEL BY RT-PCR (FLU A&B, COVID) ARPGX2
Influenza A by PCR: NEGATIVE
Influenza B by PCR: NEGATIVE
SARS Coronavirus 2 by RT PCR: NEGATIVE

## 2020-03-07 LAB — LACTIC ACID, PLASMA: Lactic Acid, Venous: 3.3 mmol/L (ref 0.5–1.9)

## 2020-03-07 MED ORDER — ACETAMINOPHEN 325 MG PO TABS: 650.0000 mg | ORAL_TABLET | Freq: Four times a day (QID) | ORAL | Status: DC | PRN

## 2020-03-07 MED ORDER — LACTATED RINGERS IV BOLUS (SEPSIS)
1000.0000 mL | Freq: Once | INTRAVENOUS | Status: AC
  Administered 2020-03-07: 11:00:00 1000 mL via INTRAVENOUS

## 2020-03-07 MED ORDER — ONDANSETRON HCL 4 MG/2ML IJ SOLN: 4.0000 mg | Freq: Four times a day (QID) | INTRAMUSCULAR | Status: DC | PRN

## 2020-03-07 MED ORDER — LACTATED RINGERS IV BOLUS (SEPSIS)
250.0000 mL | Freq: Once | INTRAVENOUS | Status: AC
  Administered 2020-03-07: 11:00:00 250 mL via INTRAVENOUS

## 2020-03-07 MED ORDER — LACTATED RINGERS IV BOLUS (SEPSIS)
500.0000 mL | Freq: Once | INTRAVENOUS | Status: AC
  Administered 2020-03-07: 11:00:00 500 mL via INTRAVENOUS

## 2020-03-07 MED ORDER — SODIUM CHLORIDE 0.9 % IV SOLN
2.0000 g | Freq: Once | INTRAVENOUS | Status: AC
  Administered 2020-03-07: 12:00:00 2 g via INTRAVENOUS
  Filled 2020-03-07: qty 2

## 2020-03-07 MED ORDER — LORAZEPAM 2 MG/ML IJ SOLN: 1.0000 mg | INTRAMUSCULAR | Status: DC | PRN

## 2020-03-07 MED ORDER — GLYCOPYRROLATE 0.2 MG/ML IJ SOLN: 0.2000 mg | INTRAMUSCULAR | Status: DC | PRN

## 2020-03-07 MED ORDER — ACETAMINOPHEN 650 MG RE SUPP: 650.0000 mg | Freq: Four times a day (QID) | RECTAL | Status: DC | PRN

## 2020-03-07 MED ORDER — VANCOMYCIN VARIABLE DOSE PER UNSTABLE RENAL FUNCTION (PHARMACIST DOSING): Status: DC

## 2020-03-07 MED ORDER — ONDANSETRON 4 MG PO TBDP: 4.0000 mg | ORAL_TABLET | Freq: Four times a day (QID) | ORAL | Status: DC | PRN

## 2020-03-07 MED ORDER — ACETAMINOPHEN 650 MG RE SUPP
650.0000 mg | Freq: Once | RECTAL | Status: AC
  Administered 2020-03-07: 12:00:00 650 mg via RECTAL
  Filled 2020-03-07: qty 1

## 2020-03-07 MED ORDER — BIOTENE DRY MOUTH MT LIQD: 15.0000 mL | Freq: Two times a day (BID) | OROMUCOSAL | Status: DC

## 2020-03-07 MED ORDER — VANCOMYCIN HCL IN DEXTROSE 1-5 GM/200ML-% IV SOLN: 1000.0000 mg | Freq: Once | INTRAVENOUS | Status: DC

## 2020-03-07 MED ORDER — POLYVINYL ALCOHOL 1.4 % OP SOLN
1.0000 [drp] | Freq: Four times a day (QID) | OPHTHALMIC | Status: DC | PRN
  Filled 2020-03-07: qty 15

## 2020-03-07 MED ORDER — LACTATED RINGERS IV SOLN: INTRAVENOUS | Status: DC

## 2020-03-07 MED ORDER — HALOPERIDOL LACTATE 5 MG/ML IJ SOLN: 2.0000 mg | Freq: Four times a day (QID) | INTRAMUSCULAR | Status: DC | PRN

## 2020-03-07 MED ORDER — FENTANYL CITRATE (PF) 100 MCG/2ML IJ SOLN: 50.0000 ug | INTRAMUSCULAR | Status: DC | PRN

## 2020-03-07 MED ORDER — SODIUM CHLORIDE 0.9 % IV SOLN: 2.0000 g | INTRAVENOUS | Status: DC

## 2020-03-07 MED ORDER — METRONIDAZOLE IN NACL 5-0.79 MG/ML-% IV SOLN
500.0000 mg | Freq: Once | INTRAVENOUS | Status: AC
  Administered 2020-03-07: 12:00:00 500 mg via INTRAVENOUS
  Filled 2020-03-07: qty 100

## 2020-03-07 NOTE — Progress Notes (Signed)
elink tracking sepsis 

## 2020-03-07 NOTE — Progress Notes (Signed)
Pharmacy Antibiotic Note  Ashley [name] is a 86 y.o. female admitted on 03/07/2020 with sepsis.  Pharmacy has been consulted for vancomycin and cefepime dosing.  Plan: Vancomycin 1000 mg IV x 1, then variable dosing due to unstable renal function Cefepime 2g IV every 24 hours Monitor renal function, Cx and clinical progression to narrow Vancomycin random level as needed    Temp (24hrs), Avg:102.1 F (38.9 C), Min:102.1 F (38.9 C), Max:102.1 F (38.9 C)  No results for input(s): WBC, CREATININE, LATICACIDVEN, VANCOTROUGH, VANCOPEAK, VANCORANDOM, GENTTROUGH, GENTPEAK, GENTRANDOM, TOBRATROUGH, TOBRAPEAK, TOBRARND, AMIKACINPEAK, AMIKACINTROU, AMIKACIN in the last 168 hours.  CrCl cannot be calculated (Patient's most recent lab result is older than the maximum 21 days allowed.).    No Known Allergies  Bertis Ruddy, PharmD Clinical Pharmacist ED Pharmacist Phone # [phone] 03/07/2020 11:14 AM

## 2020-03-07 NOTE — Hospital Course (Signed)
Pt is 86 year old female presented meeting SIRS criteria, while in ED family decided to change to comfort care and proceed with home hospice. PMH includes HTN, RA, Dementia, Asthma, A fIb, cryptococcal pneumonitis, and recent hip fracture.  Patient presented to ED initially meeting SIRS criteria with unknown source of infection.  After receiving fluids and antibiotics, decision was made by family to shift focus to comfort care.  Patient had been making a general decline in the last 2.5 years in the setting of advancing dementia.  She had a much steeper decline after her hospitalization in November following a hip fracture.  Please see H&P for further admission details.    In the setting of shifting focus to comfort care, palliative care was consulted and provided recommendations for comfort measures and medications.  Home hospice was initiated and patient was discharged home to continue end of life care.

## 2020-03-07 NOTE — Consult Note (Signed)
Consultation Note Date: 03/07/2020   Patient Name: Ashley [name]  DOB: 1928-07-16  MRN: [id number]  Age / Sex: 86 y.o., female  PCP: [facility] [place] Referring Physician: Zenia Resides, MD  Reason for Consultation: Disposition, Non pain symptom management, Pain control, Psychosocial/spiritual support and Terminal Care  HPI/Patient Profile: 86 y.o. female  with past medical history of hypertension, dementia, atrial fibrillation, rheumatoid arthritis presented to the ED on 03/07/20 from home due to family concern of decline in health over the last 3 days. Upon evaluation, patient was found to have sepsis with acute renal failure and atrial fibrillation with RVR. After discussion with EDP, family decided to pursue full comfort measures. Family is interested in home hospice support. Patient is being admitted on 03/07/2020 for end of life care until home hospice can be initiated.   PMT was consulted for EOL symptom management.  Clinical Assessment and Goals of Care: I have reviewed medical records including EPIC notes, labs, and imaging. Received report from primary RN - no acute concerns.   Received report from Dr. Sandi Carne - she has contacted patient's son who ultimately decided they would like for patient to return home with hospice support.   Went to visit patient at bedside - no family/visitors present. Patient was lying in bed - she did not wake to voice or gentle touch. No signs or non-verbal gestures of pain or discomfort noted. No respiratory distress, increased work of breathing, or secretions noted.   Attempted to call son/Larry to discuss EOL wishes, disposition, and options - no answer - confidential voicemail left and PMT phone number provided with request to return call if they had any questions about home hospice.   TOC notified and consult placed for family's request for home  hospice per Dr. Sandi Carne.   Orders reviewed and EOL comfort care orders placed.   Primary Decision Maker: NEXT OF KIN - son/Larry Grunow    SUMMARY OF RECOMMENDATIONS: Continue full comfort measures; patient likely has hours to days Continue DNR/DNI as previously documented Per Dr. Sandi Carne, family would like patient to return home with hospice. TOC notified and consult placed Added orders for EOL symptom management and to reflect full comfort measures, as well as discontinued orders that were not focused on comfort Unrestricted visitation orders were placed per current [place] EOL visitation policy  Provide frequent assessments and administer PRN medications as clinically necessary to ensure EOL comfort PMT will continue to follow holistically   Code Status/Advance Care Planning:  DNR   Palliative Prophylaxis:   Aspiration, Bowel Regimen, Delirium Protocol, Frequent Pain Assessment, Oral Care and Turn Reposition  Additional Recommendations (Limitations, Scope, Preferences):  Full Comfort Care  Psycho-social/Spiritual:  Created space and opportunity for patient to express thoughts and feelings regarding patient's current medical situation.   Prognosis:   Hours - Days  Discharge Planning: Home with Hospice      Primary Diagnoses: Present on Admission: . Sepsis ([place])   I have reviewed the medical record, interviewed the patient and  family, and examined the patient. The following aspects are pertinent.  Past Medical History:  Diagnosis Date  . Arthritis    "all over" (08/13/2013)  . Asthma   . 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 Colocutaneous fistula   . Cryptococcal pneumonitis ([facility])    right lower lobe  . Dementia ([place])   . Diverticulitis   . GERD (gastroesophageal reflux disease)   . PXTGGYIR(485.4)    "monthly" (08/13/2013)  . Hyperlipidemia   . Hypertension   . Renal cell carcinoma    Left kidney  . Rheumatoid arthritis(714.0)    . Syncope   . Uterine prolapse    Social History   Socioeconomic History  . Marital status: Married    Spouse name: Not on file  . Number of children: Not on file  . Years of education: Not on file  . Highest education level: Not on file  Occupational History  . Occupation: retired  Tobacco Use  . Smoking status: Never Smoker  . Smokeless tobacco: Never Used  Substance and Sexual Activity  . Alcohol use: No  . Drug use: No  . Sexual activity: Not Currently  Other Topics Concern  . Not on file  Social History Narrative  . Not on file   Social Determinants of Health   Financial Resource Strain: Not on file  Food Insecurity: Not on file  Transportation Needs: Not on file  Physical Activity: Not on file  Stress: Not on file  Social Connections: Not on file   Family History  Problem Relation Age of Onset  . Heart disease Maternal Aunt        Family hx unk did not live with family   Scheduled Meds: Continuous Infusions: PRN Meds:. Medications Prior to Admission:  Prior to Admission medications   Medication Sig Start Date End Date Taking? Authorizing Provider  acetaminophen (TYLENOL) 650 MG CR tablet Take 1,300 mg by mouth daily.   Yes [provider]  feeding supplement, ENSURE COMPLETE, (ENSURE COMPLETE) LIQD Take 237 mLs by mouth 2 (two) times daily between meals. 08/19/13  Yes Dellinger, Bobby Rumpf, PA-C  leflunomide (ARAVA) 10 MG tablet Take 10 mg by mouth daily.   Yes [provider]  methylPREDNISolone (MEDROL) 4 MG tablet Take 4 mg by mouth daily.  01/21/17  Yes [provider]  polyethylene glycol (MIRALAX / GLYCOLAX) packet Take 17 g by mouth daily. 08/19/13  Yes Dellinger, Bobby Rumpf, PA-C  sucralfate (CARAFATE) 1 g tablet Take 1 g by mouth with breakfast, with lunch, and with evening meal.   Yes [provider]  sulfaSALAzine (AZULFIDINE) 500 MG EC tablet Take 1,000 mg by mouth every 12 (twelve) hours.   Yes [provider]  fluticasone (FLONASE) 50 MCG/ACT nasal spray Place 2 sprays into both nostrils as needed for allergies or rhinitis. Patient not taking: Reported on 03/07/2020 02/06/17   Dessa Phi, DO  HYDROcodone-acetaminophen (NORCO/VICODIN) 5-325 MG tablet Take 1 tablet by mouth 3 (three) times daily as needed for moderate pain. Patient not taking: No sig reported 01/24/20   Lavina Hamman, MD  metoprolol tartrate (LOPRESSOR) 25 MG tablet Take 1 tablet (25 mg total) by mouth 2 (two) times daily. Patient not taking: No sig reported 01/24/20   Lavina Hamman, MD   No Known Allergies Review of Systems  Unable to perform ROS: Acuity of condition    Physical Exam Vitals and nursing note reviewed.  Constitutional:      General:  She is not in acute distress.    Appearance: She is ill-appearing.     Comments: Frail appearing  Pulmonary:     Effort: No respiratory distress.  Skin:    General: Skin is warm and dry.  Neurological:     Mental Status: She is lethargic.     Motor: Weakness present.  Psychiatric:        Speech: She is noncommunicative.     Vital Signs: BP (!) 86/65 (BP Location: Right Arm)   Pulse (!) 171   Temp 98.6 F (37 C) (Rectal)   Resp (!) 26   SpO2 95%          SpO2: SpO2: 95 % O2 Device:SpO2: 95 % O2 Flow Rate: .   IO: Intake/output summary: No intake or output data in the 24 hours ending 03/07/20 1511  LBM:   Baseline Weight:   Most recent weight:       Palliative Assessment/Data: PPS 10-20%     Time In: 1445 Time Out: 1535 Time Total: 50 minutes  Greater than 50%  of this time was spent counseling and coordinating care related to the above assessment and plan.  Signed by: Lin Landsman, NP   Please contact Palliative Medicine Team phone at [phone] for questions and concerns.  For individual provider: See Shea Evans
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
## 2020-03-07 NOTE — ED Notes (Signed)
Per Sandi Carne, MD pt is comfort care. Requesting to disconnect pt from cardiac monitor, remove IV's and keep pt comfortable.

## 2020-03-07 NOTE — Progress Notes (Signed)
Code Sepsis monitoring discontinued due to cancellation.   Secure chat with MD and bedside RN informs me pt is going hospice care and they will DC sepsis protocol. elink will stop tracking.

## 2020-03-07 NOTE — ED Notes (Signed)
MD aware abx were started prior to [facility] set due to difficult access.

## 2020-03-07 NOTE — Social Work (Signed)
CSW contacted son Fritz Pickerel @ [phone].  Son chose [facility] for in-home services. CSW contacted [place] will reach out to family to begin process.Marland Kitchen

## 2020-03-07 NOTE — Progress Notes (Unsigned)
   Referral received from SW in ED. I have spoke to the pt's son Fritz Pickerel regarding goals and hospice care in his home. He agrees that comfort is the goal and has all the needed equipment in his home for the use and the pt safety.   Pt appropriate for hospice care and our MD has approved pt for care at home. We will cont. To follow until d/c and set up intakel visit when convenient for family in home.   Webb Silversmith RN [phone]

## 2020-03-07 NOTE — ED Triage Notes (Signed)
PT BIBA from home with decline in health for 3 days Pt had poor oral intake for the past 3 days. Pt found to have a HR 190BPM by EMS. Pt has end stage dementia. Resp even and unlabored.

## 2020-03-07 NOTE — H&P (Signed)
Family Medicine Teaching Service Daily Progress Note Intern Pager: [phone]  Patient name: Ashley [name] Medical record number: [id number] Date of birth: 03-31-1928 Age: 86 y.o. Gender: female  Primary Care Provider: Practice, [facility] Family Consultants: None Code Status: DNR/DNI - comfort care only  Pt Overview and Major Events to Date:  Admission on 03/07/2020  Assessment and Plan: Pt is 86 year old female presented with sepsis, family has now decided to change to comfort care and proceed with home hospice. PMH includes HTN, RA, Dementia, Asthma, A fIb, cryptococcal pneumonitis.  Patient is bedridden and receiving full care by family at home.   Comfort Care Overall, patient has been having a steady decline in last 2.5 years, with a steeper decline in the last month after breaking her hip and being hospitalized.  Since then, she has been bedbound and reliant upon family for care.  Per her son, in the last two weeks she has been sleeping all day and stopped talking about 1-2 weeks ago.  Patient had decline in health with poor oral intake for last 3 days.  Patient was febrile at temperature of 102.1, heart rate was 190 bpm, tachypnea at respiratory rate 21 but breathing unlabored. Labs showed WBCs of 12, high sodium 154, potassium 4.3, glucose 97.  Chest x-ray showed Low lung volumes with mild bibasilar atelectasis and or scarring.  EKG revealed A. fib with rapid heart rate. Blood cultures were drawn as she met SIRS criteria with unknown source of infection. Pt was given bolus of fluids RL  (1750 ml), 1 dose of cefepime, and Flagyl. Pt's son who is the power of attorney stated that he wants to proceed with hospice care and doesn't want any intervention given the patient's over all decline. Sepsis monitoring discontinued as patient was transitioned to hospice care. Patient will be admitted to assist to allow for outpatient hospice services to be initiated.  Hopefully, patient will be able to  discharge to home with hospice on 12/21. Dr. Eino Farber talked to Paulina Fusi, and he confirmed that they will be taking her home with Hospise.  -Place in observation. Attending- Dr. Andria Frames -Palliative following, appreciate recs for end of life care - comfort medications per palliative  - tylenol q6h prn pain  - biotene solution  - fentanyl 33mg q2h prn pain, dyspnea  - robinul prn excessive secretions  - haldol 268mq6h prn agitation or delirium  - ativan 57m67m1h prn anxiety, seizure  - zofran 4mg44mh prn nausea  - liquifilm tears QID prn dry eyes -Stop Antibiotics. -Continue comfort care. -Consult TOC  Acute renal failure At admission, patient creatinine 2.03, BUN 55. Pt was given IV bolus fluids(RL 1750 ml).  We will not monitor renal function as patient is hospice care. -Discontinue IV fluids.  Decreased intake Patient had decline in health with poor oral intake for last 3 days.  His son confirmed that pt has been refusing to eat for the last few days. Son confirmed that they will be taking Pt home with Hospise care. - comfort feeds - plan per above  A fibrillation Patient heart rate was 190 bpm.  - no vitals checks with comfort measures - no monitoring - no anticoag  HTN Pt's BP at Admission is 94/66 mmHg.  Pt's home meds includes Lopressor 25 mg BID. -D/C home meds as Pt is comfort care.  Rheumatoid Arthritis Home meds includes Arava and Azulfidine. -D/c Home meds.  Advanced dementia Pt has advanced dementia. Pt doesn't speak or  respond. She has had a progressive decline over the last 2.5 years which is contributing to her overall poor health at present. - comfort care per above  FEN/GI: Regular PPx: None  Disposition: Med Surg Obs  Subjective:  History obtained from patient's son, Fritz Pickerel, over the phone as she is non-verbal.  Fritz Pickerel reports that patient has had a steady decline in her mental status over the last 2.5 years, but that patient has significantly  declined in the last month after patient broke her hip.  She wandered out of the house in November and was found in the ditch.  Since that hospitalization, she has been bedbound and completely reliant upon her family for care at home.  Fritz Pickerel states that the patient has been sleeping all day for the last 2 weeks and also stopped talking about 1-2 weeks ago.  States that she has been staring when they try to communicate with her.  In the last 3 days, patient has also stopped eating and drinking.  Today, Home Health came and given patient's status, advised transport to ED.  In ED, patient met SIRS criteria, although source unknown at this time.  In further discussions with ED provider, Fritz Pickerel decided to change focus to comfort care acting in his mother's interest.  ED called for admission to assist with hospice placement.  Fritz Pickerel and his family now desire for patient to go home with hospice.  Objective: Temp:  [98.6 F (37 C)-102.1 F (38.9 C)] 98.6 F (37 C) (12/20 1310) Pulse Rate:  [171-190] 171 (12/20 1345) Resp:  [21-30] 26 (12/20 1345) BP: (86-109)/(65-86) 86/65 (12/20 1345) SpO2:  [95 %-99 %] 95 % (12/20 1345) Physical Exam: General:She is underweight and malnourished.  She is ill-appearing, not in acute distress Cardiovascular: Tachycardia present, a irregularly irregular rhythm Respiratory: Clear to auscultation bilateral, occasional chest heaves Abdomen: Soft, nontender, nondistended Extremities: No peripheral edema present. Neuro- Not able to access as Pt is not able to follow commands.  She is awake and open eyes some times.Witnesed moving upper extremities slowly. Does not follow commands or answer questions.  laboratory: Recent Labs  Lab 03/07/20 1121  WBC 12.0*  HGB 15.5*  HCT 55.5*  PLT 115*   Recent Labs  Lab 03/07/20 1121  NA 154*  K 4.3  CL 116*  CO2 15*  BUN 55*  CREATININE 2.03*  CALCIUM 9.9  PROT 6.0*  BILITOT 1.1  ALKPHOS 88  ALT 19  AST 35  GLUCOSE 97       Imaging/Diagnostic Tests: DG Chest Port 1 View  Result Date: 03/07/2020 CLINICAL DATA:  Questionable sepsis. EXAM: PORTABLE CHEST 1 VIEW COMPARISON:  02/11/2020. FINDINGS: Heart size normal. Low lung volumes. Mild bibasilar atelectasis and or scarring. Stable mild right base pleural thickening most consistent scarring. No prominent pleural effusion. No pneumothorax. Thoracic spine scoliosis. Surgical clips upper abdomen. IMPRESSION: Low lung volumes with mild bibasilar atelectasis and or scarring. Electronically Signed   By: Marcello Moores  Register   On: 03/07/2020 11:29    Armando Reichert, MD 03/07/2020, 1:58 PM PGY-1, [place] Intern pager: [phone], text pages welcome  FPTS Upper-Level Resident Addendum   I have independently interviewed and examined the patient. I have discussed the above with the original author and agree with their documentation. My edits for correction/addition/clarification are in green. Please see also any attending notes.   [place] Constable, D.O. PGY-3, [facility] 03/07/2020 4:21 PM  [place] Service pager: [phone] (text pages welcome through [place])

## 2020-03-07 NOTE — ED Notes (Signed)
Attempted to give reportx1 

## 2020-03-07 NOTE — ED Notes (Signed)
IV's removed, Cardiac Monitor disconnected. Fresh warm blankets applied. Pt resting. Will continue to monitor.

## 2020-03-08 ENCOUNTER — Encounter (HOSPITAL_COMMUNITY): Payer: Self-pay | Admitting: Family Medicine

## 2020-03-08 DIAGNOSIS — N179 Acute kidney failure, unspecified: Secondary | ICD-10-CM | POA: Diagnosis not present

## 2020-03-08 DIAGNOSIS — R6520 Severe sepsis without septic shock: Secondary | ICD-10-CM | POA: Diagnosis not present

## 2020-03-08 DIAGNOSIS — I4891 Unspecified atrial fibrillation: Secondary | ICD-10-CM | POA: Diagnosis not present

## 2020-03-08 DIAGNOSIS — A419 Sepsis, unspecified organism: Secondary | ICD-10-CM | POA: Diagnosis not present

## 2020-03-08 DIAGNOSIS — Z515 Encounter for palliative care: Secondary | ICD-10-CM | POA: Diagnosis not present

## 2020-03-08 MED ORDER — SCOPOLAMINE 1 MG/3DAYS TD PT72: 1.0000 | MEDICATED_PATCH | TRANSDERMAL | Status: DC

## 2020-03-08 MED ORDER — LORAZEPAM 2 MG/ML IJ SOLN: 1.0000 mg | INTRAMUSCULAR | 0 refills | Status: AC | PRN

## 2020-03-08 MED ORDER — ONDANSETRON 4 MG PO TBDP: 4.0000 mg | ORAL_TABLET | Freq: Four times a day (QID) | ORAL | 0 refills | Status: AC | PRN

## 2020-03-08 MED ORDER — GLYCOPYRROLATE 0.2 MG/ML IJ SOLN
0.2000 mg | INTRAMUSCULAR | Status: AC | PRN
Start: 2020-03-08 — End: ?

## 2020-03-08 MED ORDER — HALOPERIDOL LACTATE 5 MG/ML IJ SOLN: 2.0000 mg | Freq: Four times a day (QID) | INTRAMUSCULAR | Status: AC | PRN

## 2020-03-08 MED ORDER — ACETAMINOPHEN 650 MG RE SUPP: 650.0000 mg | Freq: Four times a day (QID) | RECTAL | 0 refills | Status: AC | PRN

## 2020-03-08 MED ORDER — POLYVINYL ALCOHOL 1.4 % OP SOLN: 1.0000 [drp] | Freq: Four times a day (QID) | OPHTHALMIC | 0 refills | Status: AC | PRN

## 2020-03-08 MED ORDER — BIOTENE DRY MOUTH MT LIQD: 15.0000 mL | Freq: Two times a day (BID) | OROMUCOSAL | Status: AC

## 2020-03-08 MED ORDER — OXYCODONE HCL 20 MG/ML PO CONC: 10.0000 mg | ORAL | Status: DC

## 2020-03-08 MED ORDER — MORPHINE SULFATE (CONCENTRATE) 10 MG/0.5ML PO SOLN
10.0000 mg | ORAL | Status: DC | PRN
Start: 2020-03-08 — End: 2020-03-08
  Administered 2020-03-08: 10 mg via ORAL
  Filled 2020-03-08: qty 0.5

## 2020-03-08 MED ORDER — GLYCOPYRROLATE 0.2 MG/ML IJ SOLN: 0.4000 mg | INTRAMUSCULAR | Status: DC | PRN

## 2020-03-08 MED ORDER — LORAZEPAM 2 MG/ML PO CONC: 1.0000 mg | Freq: Four times a day (QID) | ORAL | Status: DC

## 2020-03-08 MED ORDER — ONDANSETRON HCL 4 MG/2ML IJ SOLN: 4.0000 mg | Freq: Four times a day (QID) | INTRAMUSCULAR | 0 refills | Status: AC | PRN

## 2020-03-08 MED ORDER — ACETAMINOPHEN 325 MG PO TABS: 650.0000 mg | ORAL_TABLET | Freq: Four times a day (QID) | ORAL | Status: AC | PRN

## 2020-03-08 MED ORDER — LORAZEPAM 2 MG/ML PO CONC: 1.0000 mg | ORAL | Status: DC | PRN

## 2020-03-08 MED ORDER — OXYCODONE HCL 20 MG/ML PO CONC: 10.0000 mg | ORAL | Status: DC | PRN

## 2020-03-08 MED ORDER — FENTANYL CITRATE (PF) 100 MCG/2ML IJ SOLN: 50.0000 ug | INTRAMUSCULAR | 0 refills | Status: AC | PRN

## 2020-03-12 LAB — CULTURE, BLOOD (ROUTINE X 2): Culture: NO GROWTH

## 2020-03-15 ENCOUNTER — Telehealth: Payer: Self-pay | Admitting: Family Medicine

## 2020-03-15 NOTE — Telephone Encounter (Signed)
Death Certificate was dropped off by [facility] and has been placed in PCP's box for completion. Please use blue or black ink. Please return to [facility] once completed.

## 2020-03-17 NOTE — Telephone Encounter (Signed)
[employer] called for pick-up of DC.  [facility] Practice Administrator notified.

## 2020-03-17 NOTE — Telephone Encounter (Signed)
Completed.

## 2020-03-19 NOTE — Progress Notes (Signed)
Family Medicine Teaching Service Daily Progress Note Intern Pager: [phone]  Patient name: Ashley [name] Medical record number: [id number] Date of birth: 08/02/1928 Age: 87 y.o. Gender: female  Primary Care Provider: Practice, [facility] Family Consultants: palliative care Code Status: DNR  Pt Overview and Major Events to Date:  12/20 - admitted  Assessment and Plan: Pt is 87 year old female presented with sepsis, family has now decided to change to comfort care and proceed with home hospice. PMH includes HTN, RA, Dementia, Asthma, A fIb, cryptococcal pneumonitis.  Patient is bedridden and receiving full care by family at home.   Sepsis in setting of advanced dementia (on comfort care and sees -patient presented with sepsis.  After discussion with palliative care team, family has decided to pursue comfort care and hospice. -Palliative care consulted, appreciate recs - Tylenol 650mg  q6h PRN - fentanyl 82mcg q2hr prn -Glycopyrrolate IV q4h PRN for secretions - haldol 2mg  IV q6h PRN for agitation - biotene mouth wash - ativan 1mg  IV q1h PRN for anxiety - zofran ODT 4mg  q6h PRN for nausea -Bladder scans every shift  FEN/GI: Regular diet PPx: None  Disposition: Home with home hospice  Subjective:  Patient laying in bed.  Not responsive to name.  Told pt we will try to get her home as soon as we are able.    Objective: Temp:  [98.6 F (37 C)-102.1 F (38.9 C)] 98.6 F (37 C) (12/20 1310) Pulse Rate:  [162-190] 172 (12/20 1942) Resp:  [19-30] 19 (12/20 1942) BP: (82-109)/(64-86) 82/64 (12/20 1942) SpO2:  [94 %-99 %] 96 % (12/20 1942) Physical Exam: General: laying in bed.  Mouth is open, left eye slightly open.  Pt not responsive to voice.   Cardiovascular: RRR. No murmurs.  Respiratory: tachycardic.   Abdomen: soft, normal bowel sounds.   Laboratory: Recent Labs  Lab 03/07/20 1121  WBC 12.0*  HGB 15.5*  HCT 55.5*  PLT 115*   Recent Labs  Lab 03/07/20 1121   NA 154*  K 4.3  CL 116*  CO2 15*  BUN 55*  CREATININE 2.03*  CALCIUM 9.9  PROT 6.0*  BILITOT 1.1  ALKPHOS 88  ALT 19  AST 35  GLUCOSE 97      Imaging/Diagnostic Tests:   Benay Pike, MD 04/03/20, 6:53 AM PGY-3, [place] Intern pager: [phone], text pages welcome

## 2020-03-19 NOTE — Progress Notes (Signed)
Palliative Medicine RN Note: Symptom check. Rec'd call from pt's RN that she had had a marked change in symptoms.  Upon my arrival, pt's arms are cold, and she is breathing over 45/minute. Terminal secretions are audible. She blinked when my hand neared her face, but no other indication that she was aware of me.  Teaching Service called family and told them to come to [facility].  I spoke with Dr Hilma Favors and obtained orders for comfort. I also spoke to the RN and Teaching Service, as well as PMT Harrie Foreman, who arrived to provide support. While I was on the floor, pt had another change in breathing pattern during foley insertion, which was aborted, then died shortly thereafter.   Marjie Skiff Kiesha Ensey, RN, BSN, [facility] Palliative Medicine Team March 11, 2020 2:19 PM Office [phone]

## 2020-03-19 NOTE — Discharge Instructions (Signed)
Ashley [name] was admitted for sepsis.  During this time after discussion with palliative care and the family, it was decided the patient would go on comfort care and transition to home hospice.  Patient currently receiving comfort care level therapeutics.  She will maintain these while at home hospice pending changes made by the hospice team.  Those medications are listed elsewhere in the discharge summary.

## 2020-03-19 NOTE — TOC Transition Note (Signed)
Transition of Care [facility]) - CM/SW Discharge Note   Patient Details  Name: Ashley [name] MRN: [id number] Date of Birth: [birth year]  Transition of [facility] CM/SW Contact:  Emeterio Reeve, [place] Phone Number: Mar 30, 2020, 11:11 AM   Clinical Narrative:      Pt will discharge home via ptar. CSW confirmed pts address, [address] [place] [address].   12:45 CSW informed that pt is no longer stable for discharge.   Final next level of care: Home w Hospice Care Barriers to Discharge: Barriers Resolved   Patient Goals and CMS Choice        Discharge Placement              Patient chooses bed at: Other - please specify in the comment section below: (Home with hospice) Patient to be transferred to facility by: [place] Name of family member notified: Fritz Pickerel, Son Patient and family notified of of transfer: 03/30/20  Discharge Plan and Services                                     Social Determinants of Health (SDOH) Interventions     Readmission Risk Interventions No flowsheet data found.

## 2020-03-19 NOTE — Plan of Care (Signed)
  Problem: Pain Managment: Goal: General experience of comfort will improve Outcome: Progressing   Problem: Safety: Goal: Ability to remain free from injury will improve Outcome: Progressing   Problem: Skin Integrity: Goal: Risk for impaired skin integrity will decrease Outcome: Progressing   

## 2020-03-19 NOTE — Progress Notes (Signed)
This chaplain responded to PMT consult for EOL spiritual care.  The chaplain was updated by Pt. RN-Jamie and MD-Kate. The chaplain understands the Pt. family have been contacted.   The chaplain created sacred space with the reading of Psalm 80 and prayer. The Pt. died in the presence of the MD and chaplain. The chaplain understands the MD will wait bedside for the family to arrive.  This chaplain is available for F/U spiritual care as needed.

## 2020-03-19 NOTE — Progress Notes (Signed)
Pt DNR, no pulse, no respirations, unresponsive, intern bedside, next of kin (Son, Merrily Brittle), notified, physician notified (Dr. Jeannine Kitten), Time of Death (Mar 14, 2020 06/27/1233), [facility] notified, pt belongings given to family.

## 2020-03-19 NOTE — Progress Notes (Signed)
Tried to contact all 3 family members provided on epic regarding disposition as patient is deteriorating sooner than expected. I called the son, spouse and granddaughter however unable to reach any of them. Left VM on son's phone.  No IV access so will prescribe Roxanol 10mg  Q2PRN to keep patient comfortable.   Lattie Haw MD  PGY2, Family Medicine

## 2020-03-19 NOTE — Discharge Summary (Addendum)
[facility] Death Summary  Patient name: Ashley [name] Medical record number: [id number] Date of birth: 05-Aug-1928 Age: 87 y.o. Gender: female Date of Admission: 03/07/2020  Date of Discharge: Mar 18, 2020 Admitting Physician: Zenia Resides, MD  Primary Care Provider: Practice, [facility] Family Consultants: palliative care  Indication for Hospitalization:  sepsis  Problem List:  Sepsis Acute renal failure Atrial fibrillation dementia  Disposition: funeral home   Exam:  CV: No pulse, no heart beat.  Pulm: apneic Neuro: non responsive.  Fixed gaze with pupils unresponsive to light.     Brief Hospital Course:  Patient brought to the hospital by family after worsening mental condition.  She has not been communicating or eating over the past three days.    Patient's son, Fritz Pickerel, spoke with the ED provider and decided to switch to full comfort care.  Pt was admitted to family practice inpatient team to manage while hospice arrangements were coordinated.  Palliative care was consulted and pt was transitioned to full comfort care orders.  Initial plan to discharge home for home hospice was changed due to worsening of patient's condition at around 10am.  Patient's family was notified about the change and advised to come to hospital to visit the patient before she passes.  Patient passed away at 1235pm in the presence of the chaplain and Dr. Jeani Hawking.  Patient's family arrived soon after.    Death certificate was filled out and handed to Network engineer at front desk.     Benay Pike, MD 03/18/20, 11:39 AM PGY-3, [place]

## 2020-03-19 NOTE — Progress Notes (Signed)
Nutrition Brief Note  Chart reviewed. Pt now transitioning to comfort care.  No further nutrition interventions warranted at this time.  Please re-consult as needed.   Quiana Cobaugh W, RD, LDN, CDCES Registered Dietitian II Certified Diabetes Care and Education Specialist Please refer to [facility] for RD and/or RD on-call/weekend/after hours pager  

## 2020-03-19 NOTE — Progress Notes (Signed)
   Referral received yesterday from Palliative Care dept.. I have spoke to Fritz Pickerel who would like for pt to return home and be admitted to hospice care. He reports that they do not need any equipment in home they have a hospital bed and w/c. She has been approved for hospice care at home and we will follow while in hospital and assist with services at home.   Webb Silversmith RN BSN [facility] [phone]

## 2020-03-19 DEATH — deceased
